# Patient Record
Sex: Male | Born: 1950
Health system: Southern US, Community
[De-identification: ages and names within clinical notes are randomized; demographics above are authoritative.]

## PROBLEM LIST (undated history)

## (undated) DIAGNOSIS — N400 Enlarged prostate without lower urinary tract symptoms: Secondary | ICD-10-CM

## (undated) DIAGNOSIS — T7840XA Allergy, unspecified, initial encounter: Secondary | ICD-10-CM

## (undated) DIAGNOSIS — M1990 Unspecified osteoarthritis, unspecified site: Secondary | ICD-10-CM

## (undated) DIAGNOSIS — E785 Hyperlipidemia, unspecified: Secondary | ICD-10-CM

## (undated) HISTORY — DX: Allergy, unspecified, initial encounter: T78.40XA

## (undated) HISTORY — DX: Benign prostatic hyperplasia without lower urinary tract symptoms: N40.0

## (undated) HISTORY — DX: Hyperlipidemia, unspecified: E78.5

## (undated) HISTORY — PX: COLONOSCOPY: SHX174

## (undated) HISTORY — DX: Unspecified osteoarthritis, unspecified site: M19.90

## (undated) HISTORY — PX: OTHER SURGICAL HISTORY: SHX169

---

## 2003-06-15 LAB — HM COLONOSCOPY: HM Colonoscopy: NORMAL

## 2012-06-14 ENCOUNTER — Encounter: Payer: Self-pay | Admitting: Internal Medicine

## 2012-06-14 ENCOUNTER — Ambulatory Visit (INDEPENDENT_AMBULATORY_CARE_PROVIDER_SITE_OTHER): Payer: BC Managed Care – PPO | Admitting: Internal Medicine

## 2012-06-14 VITALS — BP 102/76 | HR 51 | Temp 98.9°F | Ht 69.0 in | Wt 147.0 lb

## 2012-06-14 DIAGNOSIS — Z1211 Encounter for screening for malignant neoplasm of colon: Secondary | ICD-10-CM

## 2012-06-14 DIAGNOSIS — J329 Chronic sinusitis, unspecified: Secondary | ICD-10-CM

## 2012-06-14 DIAGNOSIS — E785 Hyperlipidemia, unspecified: Secondary | ICD-10-CM

## 2012-06-14 DIAGNOSIS — M1990 Unspecified osteoarthritis, unspecified site: Secondary | ICD-10-CM | POA: Insufficient documentation

## 2012-06-14 DIAGNOSIS — N138 Other obstructive and reflux uropathy: Secondary | ICD-10-CM

## 2012-06-14 DIAGNOSIS — N401 Enlarged prostate with lower urinary tract symptoms: Secondary | ICD-10-CM | POA: Insufficient documentation

## 2012-06-14 DIAGNOSIS — Z79899 Other long term (current) drug therapy: Secondary | ICD-10-CM

## 2012-06-14 LAB — LIPID PANEL
Cholesterol: 212 mg/dL — ABNORMAL HIGH (ref 0–200)
HDL: 78.9 mg/dL (ref >39.00)
Triglycerides: 97 mg/dL (ref 0.0–149.0)

## 2012-06-14 LAB — LDL CHOLESTEROL, DIRECT: Direct LDL: 107.8 mg/dL

## 2012-06-14 NOTE — Assessment & Plan Note (Addendum)
Currently managed with Crestor 5 mg daily goal LDL of 130 or less. Well controlled by current labs. However because of his travel which has him sitting behind the wheel for several hours a day I have recommended that he add baby aspirin to his current regimen.

## 2012-06-14 NOTE — Assessment & Plan Note (Addendum)
Mild, relieved with activity. Occasional Aleve and Tylenol. No signs of cellulitis on exam.

## 2012-06-14 NOTE — Progress Notes (Signed)
Patient ID: Johnny Munoz, male   DOB: 17-Aug-1951, 61 y.o.   MRN: [id number] Patient Active Problem List  Diagnosis  . Osteoarthritis  . Hyperlipidemia LDL goal <130  . BPH with obstruction/lower urinary tract symptoms    Subjective:  CC:   Chief Complaint  Patient presents with  . Establish Care    New Pt to establish     HPI:   Johnny Munoz is a 61 y.o. male who presents as a new patient to establish primary care with the chief complaint of 1)  Hyperlipidemia. managed with crestor,  But was unable to get timely labs order from pervious PCP's office and refills on time .  He denies current myalgias but has a history of myalgias on prior Lipitor therapy and any history of elevated LFTs. He didn't present today a fasting state so he can have his blood work done. He has no history of coronary artery disease or strokes 2) second issue is  chronic sinusitis secondary to deviated septum.  He is trying to avoid surgery and has one or 2 sinus infections per year. Third issue is mild decreased urinary flow secondary to BPH. He is status post transurethral microwave therapy in 2008 for prostate by Dr. Artis Flock, but is now managed by [facility], Dr.  Hillis Range .   his last  PSAs  was elevated at 4.1 ,  and he underwent a biopsy last November which was negative for cancer .  is mildly reduced flow issues are recognized by Dr. Hillis Range but tre is being postponed for a year.  4) occasional arthritis involving the hands and calf cramps occurring at night, which he states have both improved since switching to Crestor .  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 He does engage in a lot of transpyloric travel a daily basis for his job and drives frequently to [place] .    Past Medical History  Diagnosis Date  . Arthritis   . Allergy   . Hyperlipidemia   . BPH (benign prostatic hyperplasia)     Past Surgical History  Procedure Date  . Trans urethral microwave therapy     Family  History  Problem Relation Age of Onset  . Diabetes Father   . Heart disease Father     CABG after 35  . Cancer Neg Hx     History   Social History  . Marital Status: Married    Spouse Name: N/A    Number of Children: N/A  . Years of Education: 16   Occupational History  . Customer Service    Social History Main Topics  . Smoking status: Never Smoker   . Smokeless tobacco: Not on file  . Alcohol Use: 1.5 oz/week    3 drink(s) per week  . Drug Use: No  . Sexually Active: Not on file   Other Topics Concern  . Not on file   Social History Narrative   Regular exercise-yesCaffeine Use-yes   No Known Allergies   Review of Systems:   The remainder of the review of systems was negative except those addressed in the HPI.       Objective:  BP 102/76  Pulse 51  Temp 98.9 F (37.2 C) (Oral)  Ht 5\' 9"  (1.753 m)  Wt 147 lb (66.679 kg)  BMI 21.71 kg/m2  SpO2 97%  General appearance: alert, cooperative and appears stated age Ears: normal TM's and external ear canals both ears Throat: lips, mucosa, and tongue normal; teeth and  gums normal Neck: no adenopathy, no carotid bruit, supple, symmetrical, trachea midline and thyroid not enlarged, symmetric, no tenderness/mass/nodules Back: symmetric, no curvature. ROM normal. No CVA tenderness. Lungs: clear to auscultation bilaterally Heart: regular rate and rhythm, S1, S2 normal, no murmur, click, rub or gallop Abdomen: soft, non-tender; bowel sounds normal; no masses,  no organomegaly Pulses: 2+ and symmetric Skin: Skin color, texture, turgor normal. No rashes or lesions Lymph nodes: Cervical, supraclavicular, and axillary nodes normal.  Assessment and Plan:  Osteoarthritis Mild, relieved with activity. Occasional Aleve and Tylenol. No signs of cellulitis on exam.   Hyperlipidemia LDL goal <130 Currently managed with Crestor 5 mg daily goal LDL of 130 or less. Well controlled by current labs. However because of his  travel which has him sitting behind the wheel for several hours a day I have recommended that he add baby aspirin to his current regimen.  BPH with obstruction/lower urinary tract symptoms Symptoms are mild and Currently under surveillance by [place].  No therapy for decreased flow due to recent elevated  PSA   Updated Medication List Outpatient Encounter Prescriptions as of 06/14/2012  Medication Sig Dispense Refill  . Coenzyme Q10 (COQ10) 100 MG CAPS Take 1 capsule by mouth daily.      . CRESTOR 5 MG tablet Take 5 mg by mouth daily.       Illa Level 80 MCG/ACT AERS 2 puffs into nostril daily         Orders Placed This Encounter  Procedures  . Lipid panel  . COMPLETE METABOLIC PANEL WITH GFR  . LDL cholesterol, direct  . HM COLONOSCOPY    No Follow-up on file.

## 2012-06-14 NOTE — Assessment & Plan Note (Signed)
Symptoms are mild and Currently under surveillance by [facility].  No therapy for decreased flow due to recent elevated  PSA

## 2012-06-14 NOTE — Patient Instructions (Addendum)
Try using Gold bond in socks /shoes to prevent recurrent fungal infections and change socks when damp.  Simply Saline twice daily to flush sinuses, will help prevent recurrent infections.   Add baby aspirin (81 mg) daily to current regimen bc of prolonged sedentary tine in car   We are checking  lipids and liver  Consider a Low Glycemic Index Diet and eating 6 smaller meals daily .  This frequent feeding stimulates your metabolism and the lower glycemic index foods will lower your blood sugars:   This is an example of my daily  "Low GI"  Diet:  All of the foods can be found at grocery stores and in bulk at [employer]  club   7 AM Breakfast:  Low carbohydrate Protein  Shakes (I recommend the EAS AdvantEdge "Carb Control" shakes  Or the low carb shakes by Atkins.   Both are available everywhere:  In  cases at [employer]  Or in 4 packs at grocery stores and pharmacies  2.5 carbs  (Alternative is  a toasted Arnold's Sandwhich Thin w/ peanut butter, a "Bagel Thin" with cream cheese and salmon) or  a scrambled egg burrito made with a low carb tortilla .  Avoid cereal and bananas, oatmeal too unless the old fashioned kind that takes 30-40 minutes to prepare.  the rest is overly processed, has minimal fiber, and loaded with carbohydrates!   10 AM: Protein bar by Atkins (the snack size, under 200 cal.  There are many varieties , available widely again or in bulk in limited varieties at [employer]Js)  Other so called "protein bars" tend to be loaded with carbohydrates.  Remember, in food advertising, the word "energy" is synonymous for " carbohydrate."  Lunch: sandwich of [place], (or any lunchmeat or canned tuna), fresh avocado and cheese on a lower carbohydrate pita bread, flatbread, or tortilla . Ok to use mayonnaise. The bread is the only source or carbohydrate that can be decreased (Joseph's makes a pita bread and a flat bread  Are 50 cal and 4 net carbs ; Toufayan makes a low carb flatbread 100 cal and 9 net carbs  and   Mission makes a low carb whole wheat tortilla  210 cal and 6 net carbs)  3 PM:  Mid day :  Another proteintttt bThe brear,  Or a  cheese stick (100 cal, 0 carbs),  Or 1 ounce of  almonds, walnuts, pistachios, pecans, peanuts,  Macadamia nuts. Or a Dannon light n Fit greek yogurt, 80 cal 8 net carbs . Avoid "granola"; the dried cranberries and raisins are loaded with carbohydrates.    6 PM  Dinner:  "mean and green:"  Meat/chicken/fish or a high protein legume; , with a green salad, and a low GI  Veggie (broccoli, cauliflower, green beans, spinach, brussel sprouts. Lima beans) : Avoid "Low fat dressings, Reyne Dumas and [address]! They are loaded with sugar! Instead use ranch, vinagrette,  Blue cheese, etc  9 PM snack : Breyer's "low carb" fudgsicle or  ice cream bar (Carb Smart line), or  Weight Watcher's ice cream bar , or anouther "no sugar added" ice cream; or another protein shake or a serving of fresh fruit with whipped cream (Avoid bananas, pineapple, grapes  and watermelon on a regular basis because they are high in sugar)   Remember that snack Substitutions should be less than 15 to 20 carbs  Per serving. Remember to subtract fiber grams to get the "net carbs."

## 2012-06-15 LAB — COMPLETE METABOLIC PANEL WITH GFR
Alkaline Phosphatase: 55 U/L (ref 39–117)
BUN: 14 mg/dL (ref 6–23)
CO2: 26 mEq/L (ref 19–32)
Creat: 0.91 mg/dL (ref 0.50–1.35)
GFR, Est African American: >89 mL/min
GFR, Est Non African American: >89 mL/min
Glucose, Bld: 89 mg/dL (ref 70–99)
Sodium: 140 mEq/L (ref 135–145)
Total Bilirubin: 0.6 mg/dL (ref 0.3–1.2)

## 2012-08-04 ENCOUNTER — Other Ambulatory Visit: Payer: Self-pay | Admitting: Internal Medicine

## 2012-08-04 DIAGNOSIS — E785 Hyperlipidemia, unspecified: Secondary | ICD-10-CM

## 2012-08-04 MED ORDER — ROSUVASTATIN CALCIUM 5 MG PO TABS: 5.0000 mg | ORAL_TABLET | Freq: Every day | ORAL | Status: DC

## 2012-08-04 NOTE — Telephone Encounter (Signed)
Patient is out of his Crestor 5 mg will need a prescription or samples.

## 2013-05-30 ENCOUNTER — Other Ambulatory Visit: Payer: Self-pay | Admitting: Internal Medicine

## 2013-05-30 NOTE — Telephone Encounter (Signed)
Has appt scheduled 06/28/13.

## 2013-06-28 ENCOUNTER — Encounter: Payer: Self-pay | Admitting: Emergency Medicine

## 2013-06-28 ENCOUNTER — Ambulatory Visit (INDEPENDENT_AMBULATORY_CARE_PROVIDER_SITE_OTHER): Payer: 59 | Admitting: Internal Medicine

## 2013-06-28 ENCOUNTER — Encounter: Payer: Self-pay | Admitting: Internal Medicine

## 2013-06-28 ENCOUNTER — Encounter: Payer: Self-pay | Admitting: *Deleted

## 2013-06-28 VITALS — BP 116/78 | HR 63 | Temp 98.7°F | Resp 16 | Ht 67.0 in | Wt 146.5 lb

## 2013-06-28 DIAGNOSIS — N138 Other obstructive and reflux uropathy: Secondary | ICD-10-CM

## 2013-06-28 DIAGNOSIS — N401 Enlarged prostate with lower urinary tract symptoms: Secondary | ICD-10-CM

## 2013-06-28 DIAGNOSIS — Z0000 Encounter for general adult medical examination without abnormal findings: Secondary | ICD-10-CM

## 2013-06-28 DIAGNOSIS — Z79899 Other long term (current) drug therapy: Secondary | ICD-10-CM

## 2013-06-28 DIAGNOSIS — Z1211 Encounter for screening for malignant neoplasm of colon: Secondary | ICD-10-CM

## 2013-06-28 DIAGNOSIS — Z125 Encounter for screening for malignant neoplasm of prostate: Secondary | ICD-10-CM

## 2013-06-28 DIAGNOSIS — E785 Hyperlipidemia, unspecified: Secondary | ICD-10-CM

## 2013-06-28 LAB — PSA: PSA: 2.11 ng/mL (ref 0.10–4.00)

## 2013-06-28 LAB — LIPID PANEL
Cholesterol: 201 mg/dL — ABNORMAL HIGH (ref 0–200)
HDL: 72.9 mg/dL (ref >39.00)
Triglycerides: 76 mg/dL (ref 0.0–149.0)
VLDL: 15.2 mg/dL (ref 0.0–40.0)

## 2013-06-28 LAB — COMPREHENSIVE METABOLIC PANEL
Alkaline Phosphatase: 57 U/L (ref 39–117)
BUN: 16 mg/dL (ref 6–23)
CO2: 28 mEq/L (ref 19–32)
Creatinine, Ser: 0.9 mg/dL (ref 0.4–1.5)
GFR: 89.8 mL/min (ref >60.00)
Glucose, Bld: 97 mg/dL (ref 70–99)
Sodium: 139 mEq/L (ref 135–145)
Total Bilirubin: 0.6 mg/dL (ref 0.3–1.2)

## 2013-06-28 MED ORDER — LEVOFLOXACIN 500 MG PO TABS: 500.0000 mg | ORAL_TABLET | Freq: Every day | ORAL | Status: DC

## 2013-06-28 NOTE — Assessment & Plan Note (Signed)
Well controlled on current statin therapy.   Liver enzymes are normal , no changes today.  

## 2013-06-28 NOTE — Patient Instructions (Addendum)
Take the levaquin with you to [place].,  Start for urinary symptoms,  Sinus or respiratory symtoms but not unless you have symptoms   Start taking  Full strength aspirin a few days prior to trip  Get up every 2 hours and walk /stretch legs  Compression knee highs may help prevent fluid retention during long flights.

## 2013-06-28 NOTE — Assessment & Plan Note (Signed)
Annual male exam was done excluding testicular and prostate exam. PSA is normal .  Colon ca screening was reviewed and options given.

## 2013-06-28 NOTE — Assessment & Plan Note (Signed)
He is s/p prostate biopsy in the past for elevated PSA.  Current PSA is normal at 2.11

## 2013-06-28 NOTE — Progress Notes (Signed)
Patient ID: Johnny Munoz, male   DOB: 09-01-1951, 62 y.o.   MRN: [id number]  .The patient is here for his annual physical examination and management of other chronic and acute problems.   The risk factors are reflected in the social history.  The roster of all physicians providing medical care to patient - is listed in the Snapshot section of the chart.  Activities of daily living:  The patient is 100% independent in all ADLs: dressing, toileting, feeding as well as independent mobility  Home safety : The patient has smoke detectors in the home. He wears seatbelts.  There are no firearms at home. There is no violence in the home.   There is no risks for hepatitis, STDs or HIV. There is no   history of blood transfusion. There is no travel history to infectious disease endemic areas of the world.  The patient has seen their dentist in the last six month and  their eye doctor in the last year.  They do not  have excessive sun exposure. They have seen a dermatoloigist in the last year. Discussed the need for sun protection: hats, long sleeves and use of sunscreen if there is significant sun exposure.   Diet: the importance of a healthy diet is discussed. They do have a healthy diet.  The benefits of regular aerobic exercise were discussed. He exercises a minimum of 30 minutes  5 days per week. Depression screen: there are no signs or vegative symptoms of depression- irritability, change in appetite, anhedonia, sadness/tearfullness.  The following portions of the patient's history were reviewed and updated as appropriate: allergies, current medications, past family history, past medical history,  past surgical history, past social history  and problem list.  Visual acuity was not assessed per patient preference since he has regular follow up with his ophthalmologist. Hearing and body mass index were assessed and reviewed.   During the course of the visit the patient was educated and  counseled about appropriate screening and preventive services including :  nutrition counseling, colorectal cancer screening, and recommended immunizations.    Objective:  BP 116/78  Pulse 63  Temp(Src) 98.7 F (37.1 C) (Oral)  Resp 16  Ht 5\' 7"  (1.702 m)  Wt 146 lb 8 oz (66.452 kg)  BMI 22.94 kg/m2  SpO2 98%  BP 116/78  Pulse 63  Temp(Src) 98.7 F (37.1 C) (Oral)  Resp 16  Ht 5\' 7"  (1.702 m)  Wt 146 lb 8 oz (66.452 kg)  BMI 22.94 kg/m2  SpO2 98%  General Appearance:    Alert, cooperative, no distress, appears stated age  Head:    Normocephalic, without obvious abnormality, atraumatic  Eyes:    PERRL, conjunctiva/corneas clear, EOM's intact, fundi    benign, both eyes       Ears:    Normal TM's and external ear canals, both ears  Nose:   Nares normal, septum midline, mucosa normal, no drainage   or sinus tenderness  Throat:   Lips, mucosa, and tongue normal; teeth and gums normal  Neck:   Supple, symmetrical, trachea midline, no adenopathy;       thyroid:  No enlargement/tenderness/nodules; no carotid   bruit or JVD  Back:     Symmetric, no curvature, ROM normal, no CVA tenderness  Lungs:     Clear to auscultation bilaterally, respirations unlabored  Chest wall:    No tenderness or deformity  Heart:    Regular rate and rhythm, S1 and S2 normal, no murmur, rub  or gallop  Abdomen:     Soft, non-tender, bowel sounds active all four quadrants,    no masses, no organomegaly  Extremities:   Extremities normal, atraumatic, no cyanosis or edema  Pulses:   2+ and symmetric all extremities  Skin:   Skin color, texture, turgor normal, no rashes or lesions  Lymph nodes:   Cervical, supraclavicular, and axillary nodes normal  Neurologic:   CNII-XII intact. Normal strength, sensation and reflexes      throughout   Assessment and Plan:  Hyperlipidemia LDL goal <130 Well controlled on current statin therapy.   Liver enzymes are normal , no changes today.  BPH with  obstruction/lower urinary tract symptoms He is s/p prostate biopsy in the past for elevated PSA.  Current PSA is normal at 2.11  Routine general medical examination at a health care facility Annual male exam was done excluding testicular and prostate exam. PSA is normal .  Colon ca screening was reviewed and options given.     Updated Medication List Outpatient Encounter Prescriptions as of 06/28/2013  Medication Sig Dispense Refill  . CRESTOR 5 MG tablet take 1 tablet by mouth once daily  30 tablet  0  . finasteride (PROSCAR) 5 MG tablet Take 5 mg by mouth daily.      . QNASL 80 MCG/ACT AERS 2 puffs into nostril daily      . Coenzyme Q10 (COQ10) 100 MG CAPS Take 1 capsule by mouth daily.      Marland Kitchen levofloxacin (LEVAQUIN) 500 MG tablet Take 1 tablet (500 mg total) by mouth daily.  7 tablet  0   No facility-administered encounter medications on file as of 06/28/2013.

## 2013-06-29 ENCOUNTER — Encounter: Payer: Self-pay | Admitting: Internal Medicine

## 2013-07-11 ENCOUNTER — Other Ambulatory Visit: Payer: Self-pay | Admitting: Internal Medicine

## 2013-07-21 ENCOUNTER — Ambulatory Visit: Payer: Self-pay | Admitting: General Surgery

## 2013-08-16 ENCOUNTER — Encounter: Payer: Self-pay | Admitting: General Surgery

## 2013-08-16 ENCOUNTER — Ambulatory Visit (INDEPENDENT_AMBULATORY_CARE_PROVIDER_SITE_OTHER): Payer: 59 | Admitting: General Surgery

## 2013-08-16 VITALS — BP 126/70 | HR 68 | Resp 12 | Ht 69.0 in | Wt 148.0 lb

## 2013-08-16 DIAGNOSIS — Z1211 Encounter for screening for malignant neoplasm of colon: Secondary | ICD-10-CM

## 2013-08-16 MED ORDER — POLYETHYLENE GLYCOL 3350 17 GM/SCOOP PO POWD: ORAL | Status: DC

## 2013-08-16 NOTE — Progress Notes (Signed)
Patient ID: Johnny Munoz, male   DOB: 03/13/1951, 62 y.o.   MRN: [id number]  Chief Complaint  Patient presents with  . Other    colonoscopy    HPI Johnny Munoz is a 62 y.o. male here today for an evaluation of an screening colonoscopy.Patient last colonoscopy was in 2003 no polyps found at that time.No GI problems. HPI  Past Medical History  Diagnosis Date  . Arthritis   . Allergy   . Hyperlipidemia   . BPH (benign prostatic hyperplasia)     Past Surgical History  Procedure Laterality Date  . Trans urethral microwave therapy    . Colonoscopy  2003    Dr. Maryruth Bun    Family History  Problem Relation Age of Onset  . Diabetes Father   . Heart disease Father     CABG after 67  . Cancer Neg Hx     Social History History  Substance Use Topics  . Smoking status: Never Smoker   . Smokeless tobacco: Never Used  . Alcohol Use: 1.5 oz/week    3 drink(s) per week    No Known Allergies  Current Outpatient Prescriptions  Medication Sig Dispense Refill  . CRESTOR 5 MG tablet take 1 tablet by mouth once daily  30 tablet  5  . finasteride (PROSCAR) 5 MG tablet Take 5 mg by mouth daily.      . QNASL 80 MCG/ACT AERS 2 puffs into nostril daily      . polyethylene glycol powder (GLYCOLAX/MIRALAX) powder 255 grams one bottle for colonoscopy prep  255 g  0   No current facility-administered medications for this visit.    Review of Systems Review of Systems  Constitutional: Negative.   Respiratory: Negative.   Cardiovascular: Negative.   Gastrointestinal: Negative.     Blood pressure 126/70, pulse 68, resp. rate 12, height 5\' 9"  (1.753 m), weight 148 lb (67.132 kg).  Physical Exam Physical Exam  Constitutional: He is oriented to person, place, and time. He appears well-developed and well-nourished.  Cardiovascular: Normal rate, regular rhythm and normal heart sounds.   Pulmonary/Chest: Breath sounds normal.  Neurological: He is alert and oriented to person,  place, and time.  Skin: Skin is warm and dry.    Data Reviewed None.  Assessment    Candidate for screening colonoscopy.     Plan    The procedure was reviewed. Risks associated with procedure were discussed including bleeding and perforation.     Patient has been scheduled for a colonoscopy on 10-12-13 at [facility].   [place], [place] 08/18/2013, 5:45 AM

## 2013-08-16 NOTE — Patient Instructions (Addendum)
Colonoscopy A colonoscopy is an exam to evaluate your entire colon. In this exam, your colon is cleansed. A long fiberoptic tube is inserted through your rectum and into your colon. The fiberoptic scope (endoscope) is a long bundle of enclosed and very flexible fibers. These fibers transmit light to the area examined and send images from that area to your caregiver. Discomfort is usually minimal. You may be given a drug to help you sleep (sedative) during or prior to the procedure. This exam helps to detect lumps (tumors), polyps, inflammation, and areas of bleeding. Your caregiver may also take a small piece of tissue (biopsy) that will be examined under a microscope. LET YOUR CAREGIVER KNOW ABOUT:   Allergies to food or medicine.  Medicines taken, including vitamins, herbs, eyedrops, over-the-counter medicines, and creams.  Use of steroids (by mouth or creams).  Previous problems with anesthetics or numbing medicines.  History of bleeding problems or blood clots.  Previous surgery.  Other health problems, including diabetes and kidney problems.  Possibility of pregnancy, if this applies. BEFORE THE PROCEDURE   A clear liquid diet may be required for 2 days before the exam.  Ask your caregiver about changing or stopping your regular medications.  Liquid injections (enemas) or laxatives may be required.  A large amount of electrolyte solution may be given to you to drink over a short period of time. This solution is used to clean out your colon.  You should be present 60 minutes prior to your procedure or as directed by your caregiver. AFTER THE PROCEDURE   If you received a sedative or pain relieving medication, you will need to arrange for someone to drive you home.  Occasionally, there is a little blood passed with the first bowel movement. Do not be concerned. FINDING OUT THE RESULTS OF YOUR TEST Not all test results are available during your visit. If your test results are  not back during the visit, make an appointment with your caregiver to find out the results. Do not assume everything is normal if you have not heard from your caregiver or the medical facility. It is important for you to follow up on all of your test results. HOME CARE INSTRUCTIONS   It is not unusual to pass moderate amounts of gas and experience mild abdominal cramping following the procedure. This is due to air being used to inflate your colon during the exam. Walking or a warm pack on your belly (abdomen) may help.  You may resume all normal meals and activities after sedatives and medicines have worn off.  Only take over-the-counter or prescription medicines for pain, discomfort, or fever as directed by your caregiver. Do not use aspirin or blood thinners if a biopsy was taken. Consult your caregiver for medicine usage if biopsies were taken. SEEK IMMEDIATE MEDICAL CARE IF:   You have a fever.  You pass large blood clots or fill a toilet with blood following the procedure. This may also occur 10 to 14 days following the procedure. This is more likely if a biopsy was taken.  You develop abdominal pain that keeps getting worse and cannot be relieved with medicine. Document Released: 11/14/2000 Document Revised: 02/09/2012 Document Reviewed: 06/29/2008 ExitCare Patient Information 2014 [employer].  Patient has been scheduled for a colonoscopy on 10-12-13 at [facility]. 

## 2013-08-18 ENCOUNTER — Encounter: Payer: Self-pay | Admitting: General Surgery

## 2013-10-06 ENCOUNTER — Other Ambulatory Visit: Payer: Self-pay

## 2013-10-06 ENCOUNTER — Telehealth: Payer: Self-pay | Admitting: *Deleted

## 2013-10-06 NOTE — Telephone Encounter (Signed)
Patient reports no change in medications since last office visit. This patient was instructed to pre-register by this Friday since he has not done so already. Patient does report he has his Miralax prescription. We will proceed with colonoscopy that is scheduled at [facility] for 10-12-13. He was instructed to call the office if he has further questions.

## 2013-10-08 ENCOUNTER — Other Ambulatory Visit: Payer: Self-pay | Admitting: General Surgery

## 2013-10-08 DIAGNOSIS — Z1211 Encounter for screening for malignant neoplasm of colon: Secondary | ICD-10-CM

## 2013-10-11 LAB — HM COLONOSCOPY: HM Colonoscopy: NORMAL

## 2013-10-12 ENCOUNTER — Ambulatory Visit: Payer: Self-pay | Admitting: General Surgery

## 2013-10-12 DIAGNOSIS — Z1211 Encounter for screening for malignant neoplasm of colon: Secondary | ICD-10-CM

## 2013-10-12 DIAGNOSIS — D129 Benign neoplasm of anus and anal canal: Secondary | ICD-10-CM

## 2013-10-12 DIAGNOSIS — D128 Benign neoplasm of rectum: Secondary | ICD-10-CM

## 2013-10-13 ENCOUNTER — Encounter: Payer: Self-pay | Admitting: General Surgery

## 2013-10-13 LAB — PATHOLOGY REPORT

## 2013-10-14 ENCOUNTER — Telehealth: Payer: Self-pay

## 2013-10-14 NOTE — Telephone Encounter (Signed)
Notified patient of results as instructed, patient pleased. Discussed follow-up colonoscopy in 10 years, patient agrees. Placed in recalls for 10 year follow up colonoscopy.

## 2013-10-14 NOTE — Telephone Encounter (Signed)
Message copied by Sinda Du on Fri Oct 14, 2013  8:45 AM ------      Message from: [place], [place] W      Created: Thu Oct 13, 2013  8:32 PM       Please notify the patient the polyp removed at the time of his recent colonoscopy was entirely benign. A follow up exam in 10 years would be appropriate, earlier if he develops any symptoms. Thank you      ----- Message -----         From: Jena Gauss, CMA         Sent: 10/13/2013   2:08 PM           To: Earline Mayotte, MD                   ------

## 2013-10-17 ENCOUNTER — Telehealth: Payer: Self-pay | Admitting: General Surgery

## 2013-10-17 NOTE — Telephone Encounter (Signed)
The biopsy from the ileocecal valve completed at the time of his 10/12/2013 showed polypoid colonic mucosa. No pathologic changes. No evidence of dysplasia or malignancy.  The patient was notified that he should plan on a followup screening exam in 10 years, earlier if problems arise.

## 2013-12-29 ENCOUNTER — Other Ambulatory Visit: Payer: Self-pay | Admitting: Internal Medicine

## 2013-12-29 ENCOUNTER — Other Ambulatory Visit (INDEPENDENT_AMBULATORY_CARE_PROVIDER_SITE_OTHER): Payer: No Typology Code available for payment source

## 2013-12-29 ENCOUNTER — Telehealth: Payer: Self-pay | Admitting: *Deleted

## 2013-12-29 DIAGNOSIS — E785 Hyperlipidemia, unspecified: Secondary | ICD-10-CM

## 2013-12-29 DIAGNOSIS — Z79899 Other long term (current) drug therapy: Secondary | ICD-10-CM

## 2013-12-29 LAB — COMPREHENSIVE METABOLIC PANEL
ALT: 33 U/L (ref 0–53)
AST: 21 U/L (ref 0–37)
Albumin: 4.4 g/dL (ref 3.5–5.2)
Alkaline Phosphatase: 64 U/L (ref 39–117)
BILIRUBIN TOTAL: 0.9 mg/dL (ref 0.3–1.2)
BUN: 17 mg/dL (ref 6–23)
CO2: 29 mEq/L (ref 19–32)
CREATININE: 0.9 mg/dL (ref 0.4–1.5)
Calcium: 9.2 mg/dL (ref 8.4–10.5)
Chloride: 102 mEq/L (ref 96–112)
GFR: 91.98 mL/min (ref >60.00)
Glucose, Bld: 93 mg/dL (ref 70–99)
Potassium: 4.4 mEq/L (ref 3.5–5.1)
Sodium: 138 mEq/L (ref 135–145)
Total Protein: 6.9 g/dL (ref 6.0–8.3)

## 2013-12-29 LAB — LIPID PANEL
CHOLESTEROL: 206 mg/dL — AB (ref 0–200)
HDL: 64.2 mg/dL (ref >39.00)
TRIGLYCERIDES: 111 mg/dL (ref 0.0–149.0)
Total CHOL/HDL Ratio: 3
VLDL: 22.2 mg/dL (ref 0.0–40.0)

## 2013-12-29 LAB — LDL CHOLESTEROL, DIRECT: Direct LDL: 120.8 mg/dL

## 2013-12-29 LAB — TSH: TSH: 1.8 u[IU]/mL (ref 0.35–5.50)

## 2013-12-29 NOTE — Telephone Encounter (Signed)
Patient would like to know if he needs a Tetanus, it is showing overdue on his Mychart. Also Zoster is showing overdue as well however the patient has never had the chicken pox or any exposure, does he really need to get the Zoster?

## 2013-12-29 NOTE — Telephone Encounter (Signed)
Patient informed and verbalized understanding

## 2013-12-29 NOTE — Telephone Encounter (Signed)
His office notes from dr Heber Corona indicate that he had a tetanus booster in 2009  So he is not due til 2019. I have updated his chart   Dr. Loni Muse  gave him an rx for shingles vaccine in 2013 at his [facility]. If he did not get the shingles vaccine then,  He would still be advised to get it.  If he does not recall whether he had chicken pox as a child he can have a titer checked for it next time he gets labs to confirm, BEFORE he gets the vaccine.

## 2014-01-01 ENCOUNTER — Encounter: Payer: Self-pay | Admitting: Internal Medicine

## 2014-06-02 ENCOUNTER — Other Ambulatory Visit: Payer: Self-pay | Admitting: Internal Medicine

## 2014-06-28 ENCOUNTER — Encounter: Payer: 59 | Admitting: Internal Medicine

## 2014-07-10 ENCOUNTER — Encounter: Payer: No Typology Code available for payment source | Admitting: Internal Medicine

## 2014-08-11 ENCOUNTER — Encounter: Payer: Self-pay | Admitting: Internal Medicine

## 2014-08-11 ENCOUNTER — Ambulatory Visit (INDEPENDENT_AMBULATORY_CARE_PROVIDER_SITE_OTHER): Payer: No Typology Code available for payment source | Admitting: Internal Medicine

## 2014-08-11 VITALS — BP 122/68 | HR 54 | Temp 98.7°F | Resp 14 | Ht 69.0 in | Wt 145.5 lb

## 2014-08-11 DIAGNOSIS — R5381 Other malaise: Secondary | ICD-10-CM

## 2014-08-11 DIAGNOSIS — Z79899 Other long term (current) drug therapy: Secondary | ICD-10-CM

## 2014-08-11 DIAGNOSIS — E785 Hyperlipidemia, unspecified: Secondary | ICD-10-CM

## 2014-08-11 DIAGNOSIS — R5383 Other fatigue: Secondary | ICD-10-CM

## 2014-08-11 DIAGNOSIS — Z0000 Encounter for general adult medical examination without abnormal findings: Secondary | ICD-10-CM

## 2014-08-11 DIAGNOSIS — N401 Enlarged prostate with lower urinary tract symptoms: Secondary | ICD-10-CM

## 2014-08-11 DIAGNOSIS — Z23 Encounter for immunization: Secondary | ICD-10-CM

## 2014-08-11 DIAGNOSIS — N138 Other obstructive and reflux uropathy: Secondary | ICD-10-CM

## 2014-08-11 LAB — COMPREHENSIVE METABOLIC PANEL
ALBUMIN: 4.2 g/dL (ref 3.5–5.2)
ALK PHOS: 57 U/L (ref 39–117)
ALT: 43 U/L (ref 0–53)
AST: 29 U/L (ref 0–37)
BUN: 22 mg/dL (ref 6–23)
CO2: 30 mEq/L (ref 19–32)
Calcium: 9.1 mg/dL (ref 8.4–10.5)
Chloride: 102 mEq/L (ref 96–112)
Creatinine, Ser: 0.9 mg/dL (ref 0.4–1.5)
GFR: 94.24 mL/min (ref >60.00)
Glucose, Bld: 97 mg/dL (ref 70–99)
POTASSIUM: 4.8 meq/L (ref 3.5–5.1)
Sodium: 138 mEq/L (ref 135–145)
Total Bilirubin: 0.6 mg/dL (ref 0.2–1.2)
Total Protein: 7.1 g/dL (ref 6.0–8.3)

## 2014-08-11 LAB — LIPID PANEL
CHOL/HDL RATIO: 3
Cholesterol: 175 mg/dL (ref 0–200)
HDL: 65.2 mg/dL (ref >39.00)
LDL Cholesterol: 89 mg/dL (ref 0–99)
NonHDL: 109.8
Triglycerides: 105 mg/dL (ref 0.0–149.0)
VLDL: 21 mg/dL (ref 0.0–40.0)

## 2014-08-11 NOTE — Assessment & Plan Note (Signed)
Annual exam was done excluding testicular and prostate exam. PSA is done annually by Urology .  Colon ca screening was updated with results of November colonoscopy by [facility]  immunizations reviewed and options given.  handout given

## 2014-08-11 NOTE — Progress Notes (Signed)
Pre visit review using our clinic review tool, if applicable. No additional management support is needed unless otherwise documented below in the visit note. 

## 2014-08-11 NOTE — Patient Instructions (Addendum)
You had your annual  wellness exam today   I recommend taking turmeric and fish oil for anti inflammatory properties   You received your annual influenza vaccine today  I recommend you return next month for th TDaP vaccine  (tetanus diphtheria and pertussis/whooping cough)   Potassium Content of Foods Potassium is a mineral found in many foods and drinks. It helps keep fluids and minerals balanced in your body and affects how steadily your heart beats. Potassium also helps control your blood pressure and keep your muscles and nervous system healthy. Certain health conditions and medicines may change the balance of potassium in your body. When this happens, you can help balance your level of potassium through the foods that you do or do not eat. Your health care provider or dietitian may recommend an amount of potassium that you should have each day. The following lists of foods provide the amount of potassium (in parentheses) per serving in each item. HIGH IN POTASSIUM  The following foods and beverages have 200 mg or more of potassium per serving:  Apricots, 2 raw or 5 dry (200 mg).  Artichoke, 1 medium (345 mg).  Avocado, raw,  each (245 mg).  Banana, 1 medium (425 mg).  Beans, lima, or baked beans, canned,  cup (280 mg).  Beans, white, canned,  cup (595 mg).  Beef roast, 3 oz (320 mg).  Beef, ground, 3 oz (270 mg).  Beets, raw or cooked,  cup (260 mg).  Bran muffin, 2 oz (300 mg).  Broccoli,  cup (230 mg).  Brussels sprouts,  cup (250 mg).  Cantaloupe,  cup (215 mg).  Cereal, 100% bran,  cup (200-400 mg).  Cheeseburger, single, fast food, 1 each (225-400 mg).  Chicken, 3 oz (220 mg).  Clams, canned, 3 oz (535 mg).  Crab, 3 oz (225 mg).  Dates, 5 each (270 mg).  Dried beans and peas,  cup (300-475 mg).  Figs, dried, 2 each (260 mg).  Fish: halibut, tuna, cod, snapper, 3 oz (480 mg).  Fish: salmon, haddock, swordfish, perch, 3 oz (300 mg).  Fish,  tuna, canned 3 oz (200 mg).  [place] fries, fast food, 3 oz (470 mg).  Granola with fruit and nuts,  cup (200 mg).  Grapefruit juice,  cup (200 mg).  Greens, beet,  cup (655 mg).  Honeydew melon,  cup (200 mg).  Kale, raw, 1 cup (300 mg).  Kiwi, 1 medium (240 mg).  Kohlrabi, rutabaga, parsnips,  cup (280 mg).  Lentils,  cup (365 mg).  Mango, 1 each (325 mg).  Milk, chocolate, 1 cup (420 mg).  Milk: nonfat, low-fat, whole, buttermilk, 1 cup (350-380 mg).  Molasses, 1 Tbsp (295 mg).  Mushrooms,  cup (280) mg.  Nectarine, 1 each (275 mg).  Nuts: almonds, peanuts, hazelnuts, [place], cashew, mixed, 1 oz (200 mg).  Nuts, pistachios, 1 oz (295 mg).  Orange, 1 each (240 mg).  Orange juice,  cup (235 mg).  Papaya, medium,  fruit (390 mg).  Peanut butter, chunky, 2 Tbsp (240 mg).  Peanut butter, smooth, 2 Tbsp (210 mg).  Pear, 1 medium (200 mg).  Pomegranate, 1 whole (400 mg).  Pomegranate juice,  cup (215 mg).  Pork, 3 oz (350 mg).  Potato chips, salted, 1 oz (465 mg).  Potato, baked with skin, 1 medium (925 mg).  Potatoes, boiled,  cup (255 mg).  Potatoes, mashed,  cup (330 mg).  Prune juice,  cup (370 mg).  Prunes, 5 each (305 mg).  Pudding,  chocolate,  cup (230 mg).  Pumpkin, canned,  cup (250 mg).  Raisins, seedless,  cup (270 mg).  Seeds, sunflower or pumpkin, 1 oz (240 mg).  Soy milk, 1 cup (300 mg).  Spinach,  cup (420 mg).  Spinach, canned,  cup (370 mg).  Sweet potato, baked with skin, 1 medium (450 mg).  Swiss chard,  cup (480 mg).  Tomato or vegetable juice,  cup (275 mg).  Tomato sauce or puree,  cup (400-550 mg).  Tomato, raw, 1 medium (290 mg).  Tomatoes, canned,  cup (200-300 mg).  [place], 3 oz (250 mg).  Wheat germ, 1 oz (250 mg).  Winter squash,  cup (250 mg).  Yogurt, plain or fruited, 6 oz (260-435 mg).  Zucchini,  cup (220 mg). MODERATE IN POTASSIUM The following foods and beverages  have 50-200 mg of potassium per serving:  Apple, 1 each (150 mg).  Apple juice,  cup (150 mg).  Applesauce,  cup (90 mg).  Apricot nectar,  cup (140 mg).  Asparagus, small spears,  cup or 6 spears (155 mg).  Bagel, cinnamon raisin, 1 each (130 mg).  Bagel, egg or plain, 4 in., 1 each (70 mg).  Beans, green,  cup (90 mg).  Beans, yellow,  cup (190 mg).  Beer, regular, 12 oz (100 mg).  Beets, canned,  cup (125 mg).  Blackberries,  cup (115 mg).  Blueberries,  cup (60 mg).  Bread, whole wheat, 1 slice (70 mg).  Broccoli, raw,  cup (145 mg).  Cabbage,  cup (150 mg).  Carrots, cooked or raw,  cup (180 mg).  Cauliflower, raw,  cup (150 mg).  Celery, raw,  cup (155 mg).  Cereal, bran flakes, cup (120-150 mg).  Cheese, cottage,  cup (110 mg).  Cherries, 10 each (150 mg).  Chocolate, 1 oz bar (165 mg).  Coffee, brewed 6 oz (90 mg).  Corn,  cup or 1 ear (195 mg).  Cucumbers,  cup (80 mg).  Egg, large, 1 each (60 mg).  Eggplant,  cup (60 mg).  Endive, raw, cup (80 mg).  English muffin, 1 each (65 mg).  Fish, orange roughy, 3 oz (150 mg).  Frankfurter, beef or pork, 1 each (75 mg).  Fruit cocktail,  cup (115 mg).  Grape juice,  cup (170 mg).  Grapefruit,  fruit (175 mg).  Grapes,  cup (155 mg).  Greens: kale, turnip, collard,  cup (110-150 mg).  Ice cream or frozen yogurt, chocolate,  cup (175 mg).  Ice cream or frozen yogurt, vanilla,  cup (120-150 mg).  Lemons, limes, 1 each (80 mg).  Lettuce, all types, 1 cup (100 mg).  Mixed vegetables,  cup (150 mg).  Mushrooms, raw,  cup (110 mg).  Nuts: walnuts, pecans, or macadamia, 1 oz (125 mg).  Oatmeal,  cup (80 mg).  Okra,  cup (110 mg).  Onions, raw,  cup (120 mg).  Peach, 1 each (185 mg).  Peaches, canned,  cup (120 mg).  Pears, canned,  cup (120 mg).  Peas, green, frozen,  cup (90 mg).  Peppers, green,  cup (130 mg).  Peppers, red,  cup  (160 mg).  Pineapple juice,  cup (165 mg).  Pineapple, fresh or canned,  cup (100 mg).  Plums, 1 each (105 mg).  Pudding, vanilla,  cup (150 mg).  Raspberries,  cup (90 mg).  Rhubarb,  cup (115 mg).  Rice, wild,  cup (80 mg).  Shrimp, 3 oz (155 mg).  Spinach, raw, 1 cup (170 mg).  Strawberries,  cup (125 mg).  Summer squash  cup (175-200 mg).  Swiss chard, raw, 1 cup (135 mg).  Tangerines, 1 each (140 mg).  Tea, brewed, 6 oz (65 mg).  Turnips,  cup (140 mg).  Watermelon,  cup (85 mg).  Wine, red, table, 5 oz (180 mg).  Wine, white, table, 5 oz (100 mg). LOW IN POTASSIUM The following foods and beverages have less than 50 mg of potassium per serving.  Bread, white, 1 slice (30 mg).  Carbonated beverages, 12 oz (less than 5 mg).  Cheese, 1 oz (20-30 mg).  Cranberries,  cup (45 mg).  Cranberry juice cocktail,  cup (20 mg).  Fats and oils, 1 Tbsp (less than 5 mg).  Hummus, 1 Tbsp (32 mg).  Nectar: papaya, mango, or pear,  cup (35 mg).  Rice, white or brown,  cup (50 mg).  Spaghetti or macaroni,  cup cooked (30 mg).  Tortilla, flour or corn, 1 each (50 mg).  Waffle, 4 in., 1 each (50 mg).  Water chestnuts,  cup (40 mg). Document Released: 07/01/2005 Document Revised: 11/22/2013 Document Reviewed: 10/14/2013 [facility] Patient Information 2015 [place], [place]. This information is not intended to replace advice given to you by your health care provider. Make sure you discuss any questions you have with your health care provider.   Health Maintenance A healthy lifestyle and preventative care can promote health and wellness.  Maintain regular health, dental, and eye exams.  Eat a healthy diet. Foods like vegetables, fruits, whole grains, low-fat dairy products, and lean protein foods contain the nutrients you need and are low in calories. Decrease your intake of foods high in solid fats, added sugars, and salt. Get information about a  proper diet from your health care provider, if necessary.  Regular physical exercise is one of the most important things you can do for your health. Most adults should get at least 150 minutes of moderate-intensity exercise (any activity that increases your heart rate and causes you to sweat) each week. In addition, most adults need muscle-strengthening exercises on 2 or more days a week.   Maintain a healthy weight. The body mass index (BMI) is a screening tool to identify possible weight problems. It provides an estimate of body fat based on height and weight. Your health care provider can find your BMI and can help you achieve or maintain a healthy weight. For males 20 years and older:  A BMI below 18.5 is considered underweight.  A BMI of 18.5 to 24.9 is normal.  A BMI of 25 to 29.9 is considered overweight.  A BMI of 30 and above is considered obese.  Maintain normal blood lipids and cholesterol by exercising and minimizing your intake of saturated fat. Eat a balanced diet with plenty of fruits and vegetables. Blood tests for lipids and cholesterol should begin at age 62 and be repeated every 5 years. If your lipid or cholesterol levels are high, you are over age 94, or you are at high risk for heart disease, you may need your cholesterol levels checked more frequently.Ongoing high lipid and cholesterol levels should be treated with medicines if diet and exercise are not working.  If you smoke, find out from your health care provider how to quit. If you do not use tobacco, do not start.  Lung cancer screening is recommended for adults aged 57-80 years who are at high risk for developing lung cancer because of a history of smoking. A yearly low-dose CT scan of the  lungs is recommended for people who have at least a 30-pack-year history of smoking and are current smokers or have quit within the past 15 years. A pack year of smoking is smoking an average of 1 pack of cigarettes a day for 1 year  (for example, a 30-pack-year history of smoking could mean smoking 1 pack a day for 30 years or 2 packs a day for 15 years). Yearly screening should continue until the smoker has stopped smoking for at least 15 years. Yearly screening should be stopped for people who develop a health problem that would prevent them from having lung cancer treatment.  If you choose to drink alcohol, do not have more than 2 drinks per day. One drink is considered to be 12 oz (360 mL) of beer, 5 oz (150 mL) of wine, or 1.5 oz (45 mL) of liquor.  Avoid the use of street drugs. Do not share needles with anyone. Ask for help if you need support or instructions about stopping the use of drugs.  High blood pressure causes heart disease and increases the risk of stroke. Blood pressure should be checked at least every 1-2 years. Ongoing high blood pressure should be treated with medicines if weight loss and exercise are not effective.  If you are 38-51 years old, ask your health care provider if you should take aspirin to prevent heart disease.  Diabetes screening involves taking a blood sample to check your fasting blood sugar level. This should be done once every 3 years after age 79 if you are at a normal weight and without risk factors for diabetes. Testing should be considered at a younger age or be carried out more frequently if you are overweight and have at least 1 risk factor for diabetes.  Colorectal cancer can be detected and often prevented. Most routine colorectal cancer screening begins at the age of 22 and continues through age 51. However, your health care provider may recommend screening at an earlier age if you have risk factors for colon cancer. On a yearly basis, your health care provider may provide home test kits to check for hidden blood in the stool. A small camera at the end of a tube may be used to directly examine the colon (sigmoidoscopy or colonoscopy) to detect the earliest forms of colorectal cancer.  Talk to your health care provider about this at age 2 when routine screening begins. A direct exam of the colon should be repeated every 5-10 years through age 14, unless early forms of precancerous polyps or small growths are found.  People who are at an increased risk for hepatitis B should be screened for this virus. You are considered at high risk for hepatitis B if:  You were born in a country where hepatitis B occurs often. Talk with your health care provider about which countries are considered high risk.  Your parents were born in a high-risk country and you have not received a shot to protect against hepatitis B (hepatitis B vaccine).  You have HIV or AIDS.  You use needles to inject street drugs.  You live with, or have sex with, someone who has hepatitis B.  You are a man who has sex with other men (MSM).  You get hemodialysis treatment.  You take certain medicines for conditions like cancer, organ transplantation, and autoimmune conditions.  Hepatitis C blood testing is recommended for all people born from [birth year] through 1965 and any individual with known risk factors for hepatitis C.  Healthy  men should no longer receive prostate-specific antigen (PSA) blood tests as part of routine cancer screening. Talk to your health care provider about prostate cancer screening.  Testicular cancer screening is not recommended for adolescents or adult males who have no symptoms. Screening includes self-exam, a health care provider exam, and other screening tests. Consult with your health care provider about any symptoms you have or any concerns you have about testicular cancer.  Practice safe sex. Use condoms and avoid high-risk sexual practices to reduce the spread of sexually transmitted infections (STIs).  You should be screened for STIs, including gonorrhea and chlamydia if:  You are sexually active and are younger than 24 years.  You are older than 24 years, and your health care  provider tells you that you are at risk for this type of infection.  Your sexual activity has changed since you were last screened, and you are at an increased risk for chlamydia or gonorrhea. Ask your health care provider if you are at risk.  If you are at risk of being infected with HIV, it is recommended that you take a prescription medicine daily to prevent HIV infection. This is called pre-exposure prophylaxis (PrEP). You are considered at risk if:  You are a man who has sex with other men (MSM).  You are a heterosexual man who is sexually active with multiple partners.  You take drugs by injection.  You are sexually active with a partner who has HIV.  Talk with your health care provider about whether you are at high risk of being infected with HIV. If you choose to begin PrEP, you should first be tested for HIV. You should then be tested every 3 months for as long as you are taking PrEP.  Use sunscreen. Apply sunscreen liberally and repeatedly throughout the day. You should seek shade when your shadow is shorter than you. Protect yourself by wearing long sleeves, pants, a wide-brimmed hat, and sunglasses year round whenever you are outdoors.  Tell your health care provider of new moles or changes in moles, especially if there is a change in shape or color. Also, tell your health care provider if a mole is larger than the size of a pencil eraser.  A one-time screening for abdominal aortic aneurysm (AAA) and surgical repair of large AAAs by ultrasound is recommended for men aged 16-75 years who are current or former smokers.  Stay current with your vaccines (immunizations). Document Released: 05/15/2008 Document Revised: 11/22/2013 Document Reviewed: 04/14/2011 [facility] Patient Information 2015 [place], [place]. This information is not intended to replace advice given to you by your health care provider. Make sure you discuss any questions you have with your health care provider.

## 2014-08-11 NOTE — Progress Notes (Signed)
Patient ID: Johnny Munoz, male   DOB: [birth year], 63 y.o.   MRN: [id number]   The patient is here for his annual  physical examination and management of other chronic and acute problems.Marland KitchenHe is under the care of urologist Dr Eulogio Ditch for annual  prostate exam and PSA screening due to history of BPH.,  His urinary symptoms  Have improved with Proscar and he is tolerating all of his medications without side effects.  He is exercising regularly.  He notes some joint stiffness in the  Morning upon rising which resolves with activity,  He has had some nocturnal muscle cramping that has improved with a daily banana  Discussed lipid trend,  Mediterranean diet., use of almond milk as an alternative to cows milk.     The risk factors are reflected in the social history.  The roster of all physicians providing medical care to patient - is listed in the Snapshot section of the chart.  Activities of daily living:  The patient is 100% independent in all ADLs: dressing, toileting, feeding as well as independent mobility  Home safety : The patient has smoke detectors in the home. He wears seatbelts.  There are no firearms at home. There is no violence in the home.   There is no risks for hepatitis, STDs or HIV. There is no   history of blood transfusion. There is no travel history to infectious disease endemic areas of the world.  The patient has seen their dentist in the last six month and  their eye doctor in the last year.  They do not  have excessive sun exposure. They have seen a dermatoloigist in the last year. Discussed the need for sun protection: hats, long sleeves and use of sunscreen if there is significant sun exposure.   Diet: the importance of a healthy diet is discussed. They do have a healthy diet.  The benefits of regular aerobic exercise were discussed. He exercises a minimum of 30 minutes  5 days per week. Depression screen: there are no signs or vegative symptoms of depression-  irritability, change in appetite, anhedonia, sadness/tearfullness.  The following portions of the patient's history were reviewed and updated as appropriate: allergies, current medications, past family history, past medical history,  past surgical history, past social history  and problem list.  Visual acuity was not assessed per patient preference since he has regular follow up with his ophthalmologist. Hearing and body mass index were assessed and reviewed.   During the course of the visit the patient was educated and counseled about appropriate screening and preventive services including :  nutrition counseling, colorectal cancer screening, and recommended immunizations.    Objective:  BP 122/68  Pulse 54  Temp(Src) 98.7 F (37.1 C) (Oral)  Resp 14  Ht 5\' 9"  (1.753 m)  Wt 145 lb 8 oz (65.998 kg)  BMI 21.48 kg/m2  SpO2 98%  BP 122/68  Pulse 54  Temp(Src) 98.7 F (37.1 C) (Oral)  Resp 14  Ht 5\' 9"  (1.753 m)  Wt 145 lb 8 oz (65.998 kg)  BMI 21.48 kg/m2  SpO2 98%  General Appearance:    Alert, cooperative, no distress, appears stated age  Head:    Normocephalic, without obvious abnormality, atraumatic  Eyes:    PERRL, conjunctiva/corneas clear, EOM's intact, fundi    benign, both eyes       Ears:    Normal TM's and external ear canals, both ears  Nose:   Nares normal, septum midline, mucosa normal,  no drainage   or sinus tenderness  Throat:   Lips, mucosa, and tongue normal; teeth and gums normal  Neck:   Supple, symmetrical, trachea midline, no adenopathy;       thyroid:  No enlargement/tenderness/nodules; no carotid   bruit or JVD  Back:     Symmetric, no curvature, ROM normal, no CVA tenderness  Lungs:     Clear to auscultation bilaterally, respirations unlabored  Chest wall:    No tenderness or deformity  Heart:    Regular rate and rhythm, S1 and S2 normal, no murmur, rub   or gallop  Abdomen:     Soft, non-tender, bowel sounds active all four quadrants,    no  masses, no organomegaly  Genitalia:    Deferred to urology per patient request   Rectal:    deferred per patient   Extremities:   Extremities normal, atraumatic, no cyanosis or edema  Pulses:   2+ and symmetric all extremities  Skin:   Skin color, texture, turgor normal, no rashes or lesions  Lymph nodes:   Cervical, supraclavicular, and axillary nodes normal  Neurologic:   CNII-XII intact. Normal strength, sensation and reflexes      throughout   Assessment and Plan:  Encounter for preventive health examination Annual exam was done excluding testicular and prostate exam. PSA is done annually by Urology .  Colon ca screening was updated with results of November colon[facility]  immunizations reviewed and options given.  handout given   Hyperlipidemia LDL goal <130 LDL and triglycerides are at goal on current medications. He has no side effects and liver enzymes are normal. No changes today.  Lab Results  Component Value Date   CHOL 175 08/11/2014   HDL 65.20 08/11/2014   LDLCALC 89 08/11/2014   LDLDIRECT 120.8 12/29/2013   TRIG 105.0 08/11/2014   CHOLHDL 3 08/11/2014   Lab Results  Component Value Date   ALT 43 08/11/2014   AST 29 08/11/2014   ALKPHOS 57 08/11/2014   BILITOT 0.6 08/11/2014     BPH with obstruction/lower urinary tract symptoms Improved symptoms since starting Proscar.    Updated Medication List Outpatient Encounter Prescriptions as of 08/11/2014  Medication Sig  . CRESTOR 5 MG tablet take 1 tablet by mouth once daily  . finasteride (PROSCAR) 5 MG tablet Take 5 mg by mouth daily.  . QNASL 80 MCG/ACT AERS 2 puffs into nostril daily  . [DISCONTINUED] polyethylene glycol powder (GLYCOLAX/MIRALAX) powder 255 grams one bottle for colonoscopy prep

## 2014-08-13 NOTE — Assessment & Plan Note (Signed)
LDL and triglycerides are at goal on current medications. He has no side effects and liver enzymes are normal. No changes today.  Lab Results  Component Value Date   CHOL 175 08/11/2014   HDL 65.20 08/11/2014   LDLCALC 89 08/11/2014   LDLDIRECT 120.8 12/29/2013   TRIG 105.0 08/11/2014   CHOLHDL 3 08/11/2014   Lab Results  Component Value Date   ALT 43 08/11/2014   AST 29 08/11/2014   ALKPHOS 57 08/11/2014   BILITOT 0.6 08/11/2014

## 2014-08-13 NOTE — Assessment & Plan Note (Signed)
Improved symptoms since starting Proscar.

## 2014-08-15 ENCOUNTER — Encounter: Payer: Self-pay | Admitting: Internal Medicine

## 2014-08-16 ENCOUNTER — Encounter: Payer: Self-pay | Admitting: Internal Medicine

## 2014-08-16 LAB — VARICELLA ZOSTER ABS, IGG/IGM: Varicella zoster IgG: <135 index — ABNORMAL LOW (ref >165)

## 2015-01-02 ENCOUNTER — Encounter: Payer: Self-pay | Admitting: Internal Medicine

## 2015-03-06 ENCOUNTER — Other Ambulatory Visit: Payer: Self-pay | Admitting: Internal Medicine

## 2015-04-24 ENCOUNTER — Other Ambulatory Visit: Payer: Self-pay | Admitting: Internal Medicine

## 2015-04-26 NOTE — Telephone Encounter (Signed)
Spoke with pt, he has [employer] and has his labs drawn at [facility].  He needs lab order sent to them.  Please advise

## 2015-04-26 NOTE — Telephone Encounter (Signed)
LETTER PRINTED,  RX SENT

## 2015-04-27 ENCOUNTER — Telehealth: Payer: Self-pay | Admitting: Internal Medicine

## 2015-04-27 NOTE — Telephone Encounter (Signed)
Johnny Pulse, Do you know what lab order he needs faxed?

## 2015-04-27 NOTE — Telephone Encounter (Signed)
See other encounter. Lab order has been faxed.

## 2015-04-27 NOTE — Telephone Encounter (Signed)
Pt called to give fax number [phone]) to where lab work order can be sent to occupational health.

## 2015-04-27 NOTE — Telephone Encounter (Signed)
Left message for pt to return my call on what he would like done with lab slip

## 2015-04-27 NOTE — Telephone Encounter (Signed)
Lab orders faxed.

## 2015-05-04 LAB — HEPATIC FUNCTION PANEL
ALT: 20 U/L (ref 10–40)
AST: 17 U/L (ref 14–40)
Alkaline Phosphatase: 62 U/L (ref 25–125)
Bilirubin, Total: 0.4 mg/dL

## 2015-05-04 LAB — BASIC METABOLIC PANEL
BUN: 19 mg/dL (ref 4–21)
CREATININE: 1 mg/dL (ref 0.6–1.3)
Glucose: 97 mg/dL
Potassium: 4.6 mmol/L (ref 3.4–5.3)
Sodium: 141 mmol/L (ref 137–147)

## 2015-05-04 LAB — LIPID PANEL
Cholesterol: 207 mg/dL — AB (ref 0–200)
HDL: 76 mg/dL — AB (ref 35–70)
LDL Cholesterol: 99 mg/dL
Triglycerides: 162 mg/dL — AB (ref 40–160)

## 2015-05-08 ENCOUNTER — Encounter: Payer: Self-pay | Admitting: *Deleted

## 2015-05-10 ENCOUNTER — Encounter: Payer: Self-pay | Admitting: *Deleted

## 2015-05-10 ENCOUNTER — Telehealth: Payer: Self-pay | Admitting: Internal Medicine

## 2015-05-10 NOTE — Telephone Encounter (Signed)
Letter mailed

## 2015-05-10 NOTE — Telephone Encounter (Signed)
Your cholesterol, liver and kidney function are normal.  You do not need any medication changes. Please plan to repeat the labs in 6 months.    Regards,   Dr. Wadsworth Skolnick  

## 2015-06-15 ENCOUNTER — Other Ambulatory Visit: Payer: Self-pay | Admitting: Internal Medicine

## 2015-08-04 ENCOUNTER — Other Ambulatory Visit: Payer: Self-pay | Admitting: Internal Medicine

## 2015-08-05 NOTE — Telephone Encounter (Signed)
Last OV 9.11.15, upcoming appoint 9.12.16.  Please advise refill

## 2015-08-06 NOTE — Telephone Encounter (Signed)
Refill for 30 days  And sent

## 2015-08-13 ENCOUNTER — Ambulatory Visit (INDEPENDENT_AMBULATORY_CARE_PROVIDER_SITE_OTHER): Payer: PRIVATE HEALTH INSURANCE | Admitting: Internal Medicine

## 2015-08-13 ENCOUNTER — Encounter: Payer: Self-pay | Admitting: Internal Medicine

## 2015-08-13 VITALS — BP 128/88 | HR 60 | Temp 98.5°F | Resp 12 | Ht 69.0 in | Wt 148.0 lb

## 2015-08-13 DIAGNOSIS — M159 Polyosteoarthritis, unspecified: Secondary | ICD-10-CM

## 2015-08-13 DIAGNOSIS — N401 Enlarged prostate with lower urinary tract symptoms: Secondary | ICD-10-CM

## 2015-08-13 DIAGNOSIS — M150 Primary generalized (osteo)arthritis: Principal | ICD-10-CM

## 2015-08-13 DIAGNOSIS — N138 Other obstructive and reflux uropathy: Secondary | ICD-10-CM

## 2015-08-13 DIAGNOSIS — Z0000 Encounter for general adult medical examination without abnormal findings: Secondary | ICD-10-CM

## 2015-08-13 NOTE — Assessment & Plan Note (Signed)
Annual wellness  exam was done as well as a comprehensive physical exam  .  During the course of the visit the patient was educated and counseled about appropriate screening and preventive services and screenings were brought up to date for prostate and Colon CA.  He will return for fasting labs to provide samples for diabetes screening and lipid analysis with projected  10 year  risk for CAD. nutrition counseling, skin cancer screening has been recommended, along with review of the age appropriate recommended immunizations.  Printed recommendations for health maintenance screenings was given.

## 2015-08-13 NOTE — Progress Notes (Signed)
Patient ID: Johnny Munoz, male    DOB: 10-12-1951  Age: 64 y.o. MRN: [id number] Medicare The patient is here for annual  wellness examination and management of other chronic and acute problems.    Eye exam and colonoscopy up to date   sees dahl[place]it annually  The risk factors are reflected in the social history.  The roster of all physicians providing medical care to patient - is listed in the Snapshot section of the chart.  Home safety : The patient has smoke detectors in the home. They wear seatbelts.  There are no firearms at home. There is no violence in the home.   There is no risks for hepatitis, STDs or HIV. There is no   history of blood transfusion. They have no travel history to infectious disease endemic areas of the world.  The patient has seen their dentist in the last six month. They have seen their eye doctor in the last year. They admit to slight hearing difficulty with regard to whispered voices and some television programs.  They have deferred audiologic testing in the last year.  They do not  have excessive sun exposure. Discussed the need for sun protection: hats, long sleeves and use of sunscreen if there is significant sun exposure.   Diet: the importance of a healthy diet is discussed. They do have a healthy diet.  The benefits of regular aerobic exercise were discussed. She walks 4 times per week ,  20 minutes.   Depression screen: there are no signs or vegative symptoms of depression- irritability, change in appetite, anhedonia, sadness/tearfullness.   The following portions of the patient's history were reviewed and updated as appropriate: allergies, current medications, past family history, past medical history,  past surgical history, past social history  and problem list.  Visual acuity was not assessed per patient preference since she has regular follow up with her ophthalmologist. Hearing and body mass index were assessed and reviewed.   During the  course of the visit the patient was educated and counseled about appropriate screening and preventive services including : fall prevention , diabetes screening, nutrition counseling, colorectal cancer screening, and recommended immunizations.    CC: The primary encounter diagnosis was Primary osteoarthritis involving multiple joints. Diagnoses of Encounter for preventive health examination and BPH with obstruction/lower urinary tract symptoms were also pertinent to this visit.  [name] has a past medical history of Arthritis; Allergy; Hyperlipidemia; and BPH (benign prostatic hyperplasia).   He has past surgical history that includes Trans Urethral Microwave Therapy and Colonoscopy (2003).   His family history includes Diabetes in his father; Heart disease in his father. There is no history of Cancer.He reports that he has never smoked. He has never used smokeless tobacco. He reports that he drinks about 1.5 oz of alcohol per week. He reports that he does not use illicit drugs.  Outpatient Prescriptions Prior to Visit  Medication Sig Dispense Refill  . finasteride (PROSCAR) 5 MG tablet Take 5 mg by mouth daily.    . QNASL 80 MCG/ACT AERS 2 puffs into nostril daily    . rosuvastatin (CRESTOR) 5 MG tablet take 1 tablet by mouth once daily 30 tablet 0   No facility-administered medications prior to visit.    Review of Systems   Patient denies headache, fevers, malaise, unintentional weight loss, skin rash, eye pain, sinus congestion and sinus pain, sore throat, dysphagia,  hemoptysis , cough, dyspnea, wheezing, chest pain, palpitations, orthopnea, edema, abdominal pain, nausea, melena, diarrhea, constipation,  flank pain, dysuria, hematuria, urinary  Frequency, nocturia, numbness, tingling, seizures,  Focal weakness, Loss of consciousness,  Tremor, insomnia, depression, anxiety, and suicidal ideation.      Objective:  BP 128/88 mmHg  Pulse 60  Temp(Src) 98.5 F (36.9 C) (Oral)   Resp 12  Ht 5\' 9"  (1.753 m)  Wt 148 lb (67.132 kg)  BMI 21.85 kg/m2  SpO2 97%  Physical Exam   General appearance: alert, cooperative and appears stated age Ears: normal TM's and external ear canals both ears Throat: lips, mucosa, and tongue normal; teeth and gums normal Neck: no adenopathy, no carotid bruit, supple, symmetrical, trachea midline and thyroid not enlarged, symmetric, no tenderness/mass/nodules Back: symmetric, no curvature. ROM normal. No CVA tenderness. Lungs: clear to auscultation bilaterally Heart: regular rate and rhythm, S1, S2 normal, no murmur, click, rub or gallop Abdomen: soft, non-tender; bowel sounds normal; no masses,  no organomegaly Pulses: 2+ and symmetric Skin: Skin color, texture, turgor normal. No rashes or lesions Lymph nodes: Cervical, supraclavicular, and axillary nodes normal.    Assessment & Plan:   Problem List Items Addressed This Visit      Unprioritized   Osteoarthritis - Primary    Affecting DIPs.  managed with twice weekly aleve       BPH with obstruction/lower urinary tract symptoms    Managed by urology  Johnny Munoz        Encounter for preventive health examination    Annual wellness  exam was done as well as a comprehensive physical exam  .  During the course of the visit the patient was educated and counseled about appropriate screening and preventive services and screenings were brought up to date for prostate and Colon CA.  He will return for fasting labs to provide samples for diabetes screening and lipid analysis with projected  10 year  risk for CAD. nutrition counseling, skin cancer screening has been recommended, along with review of the age appropriate recommended immunizations.  Printed recommendations for health maintenance screenings was given.           I am having Johnny Munoz maintain his QNASL, finasteride, and rosuvastatin.  No orders of the defined types were placed in this encounter.    There  are no discontinued medications.  Follow-up: No Follow-up on file.   [name], MD

## 2015-08-13 NOTE — Progress Notes (Signed)
Pre-visit discussion using our clinic review tool. No additional management support is needed unless otherwise documented below in the visit note.  

## 2015-08-13 NOTE — Assessment & Plan Note (Addendum)
Affecting DIPs.  managed with twice weekly aleve

## 2015-08-13 NOTE — Patient Instructions (Signed)

## 2015-08-13 NOTE — Assessment & Plan Note (Signed)
Managed by urology  Lester Kinsman

## 2015-08-20 LAB — HEPATIC FUNCTION PANEL
ALT: 26 U/L (ref 10–40)
AST: 20 U/L (ref 14–40)
Alkaline Phosphatase: 68 U/L (ref 25–125)
BILIRUBIN, TOTAL: 0.2 mg/dL

## 2015-08-20 LAB — LIPID PANEL
Cholesterol: 182 mg/dL (ref 0–200)
HDL: 71 mg/dL — AB (ref 35–70)
LDL Cholesterol: 77 mg/dL
Triglycerides: 171 mg/dL — AB (ref 40–160)

## 2015-08-20 LAB — BASIC METABOLIC PANEL
BUN: 15 mg/dL (ref 4–21)
Creatinine: 0.9 mg/dL (ref 0.6–1.3)
Glucose: 1033 mg/dL
Potassium: 4.3 mmol/L (ref 3.4–5.3)
SODIUM: 142 mmol/L (ref 137–147)

## 2015-08-24 ENCOUNTER — Encounter: Payer: Self-pay | Admitting: Internal Medicine

## 2015-08-27 ENCOUNTER — Telehealth: Payer: Self-pay | Admitting: Internal Medicine

## 2015-08-27 ENCOUNTER — Encounter: Payer: Self-pay | Admitting: *Deleted

## 2015-08-27 NOTE — Telephone Encounter (Signed)
Letter mailed for normal labs.

## 2015-08-27 NOTE — Telephone Encounter (Signed)
Your cholesterol, PSA, liver and kidney function are normal.    Regards,   Dr. Derrel Nip

## 2015-09-08 ENCOUNTER — Other Ambulatory Visit: Payer: Self-pay | Admitting: Internal Medicine

## 2015-09-12 ENCOUNTER — Ambulatory Visit: Payer: No Typology Code available for payment source | Admitting: Physician Assistant

## 2015-09-13 ENCOUNTER — Telehealth: Payer: Self-pay | Admitting: *Deleted

## 2015-09-13 MED ORDER — ROSUVASTATIN CALCIUM 5 MG PO TABS: 5.0000 mg | ORAL_TABLET | Freq: Every day | ORAL | Status: DC

## 2015-09-13 NOTE — Telephone Encounter (Signed)
Patient requested a medication refill for rosuvastatin. -thanks

## 2015-09-13 NOTE — Telephone Encounter (Signed)
Refill sent as requested, 

## 2015-09-14 ENCOUNTER — Ambulatory Visit: Payer: Self-pay | Admitting: Physician Assistant

## 2015-09-14 DIAGNOSIS — Z299 Encounter for prophylactic measures, unspecified: Secondary | ICD-10-CM

## 2015-12-14 ENCOUNTER — Ambulatory Visit (INDEPENDENT_AMBULATORY_CARE_PROVIDER_SITE_OTHER): Payer: Managed Care, Other (non HMO) | Admitting: Urology

## 2015-12-14 ENCOUNTER — Encounter: Payer: Self-pay | Admitting: Urology

## 2015-12-14 VITALS — BP 130/86 | HR 57 | Ht 69.0 in | Wt 147.1 lb

## 2015-12-14 DIAGNOSIS — N401 Enlarged prostate with lower urinary tract symptoms: Secondary | ICD-10-CM | POA: Diagnosis not present

## 2015-12-14 DIAGNOSIS — Z87898 Personal history of other specified conditions: Secondary | ICD-10-CM | POA: Diagnosis not present

## 2015-12-14 DIAGNOSIS — N138 Other obstructive and reflux uropathy: Secondary | ICD-10-CM

## 2015-12-14 MED ORDER — FINASTERIDE 5 MG PO TABS: 5.0000 mg | ORAL_TABLET | Freq: Every day | ORAL | Status: DC

## 2015-12-14 NOTE — Progress Notes (Signed)
12/14/2015 3:32 PM   Johnny Munoz [birth year] [id number]  Referring provider: [name], MD [place], [place] [address]  Chief Complaint  Patient presents with  . Benign Prostatic Hypertrophy    [facility]    HPI: Patient is a 65 year old Caucasian male with a history of elevated PSA and BPH with LUTS who would like to establish care with a local urologist.  History of elevated PSA Patient states about 4 years ago he underwent a prostate biopsy for a PSA level that  4.1  with Dr. [name] at the [facility] urology office. He states the biopsy results were benign.  His most recent PSA is 1.2 on 08/07/2015.  BPH WITH LUTS His IPSS score today is 8, which is moderate lower urinary tract symptomatology. He is pleased with his quality life due to his urinary symptoms. He denies any dysuria, hematuria or suprapubic pain.  He currently taking finasteride 5 mg daily.  He underwent a TUMT with Dr. [name] in 2008.  He also denies any recent fevers, chills, nausea or vomiting.  He has a family history of PCa, with a maternal uncle with prostate cancer.        IPSS      12/14/15 0800       International Prostate Symptom Score   How often have you had the sensation of not emptying your bladder? Less than 1 in 5     How often have you had to urinate less than every two hours? Less than half the time     How often have you found you stopped and started again several times when you urinated? Less than 1 in 5 times     How often have you found it difficult to postpone urination? Less than 1 in 5 times     How often have you had a weak urinary stream? Less than 1 in 5 times     How often have you had to strain to start urination? Less than 1 in 5 times     How many times did you typically get up at night to urinate? 1 Time     Total IPSS Score 8     Quality of Life due to urinary symptoms   If you were to spend the rest of  your life with your urinary condition just the way it is now how would you feel about that? Pleased        Score:  1-7 Mild 8-19 Moderate 20-35 Severe     PMH: Past Medical History  Diagnosis Date  . Arthritis   . Allergy   . Hyperlipidemia   . BPH (benign prostatic hyperplasia)     Surgical History: Past Surgical History  Procedure Laterality Date  . Trans urethral microwave therapy    . Colonoscopy  2003    Dr. [name]    Home Medications:    Medication List       This list is accurate as of: 12/14/15 11:59 PM.  Always use your most recent med list.               finasteride 5 MG tablet  Commonly known as:  PROSCAR  Take 1 tablet (5 mg total) by mouth daily.     ibuprofen 200 MG tablet  Commonly known as:  ADVIL,MOTRIN  Take 200 mg by mouth every 6 (six) hours as needed.     OSTEO BI-FLEX JOINT SHIELD  PO  Take by mouth.     QNASL 80 MCG/ACT Aers  Generic drug:  Beclomethasone Dipropionate  2 puffs into nostril daily     rosuvastatin 5 MG tablet  Commonly known as:  CRESTOR  Take 1 tablet (5 mg total) by mouth daily.        Allergies: No Known Allergies  Family History: Family History  Problem Relation Age of Onset  . Diabetes Father   . Heart disease Father     CABG after 65  . Prostate cancer Maternal Uncle     Social History:  reports that he has never smoked. He has never used smokeless tobacco. He reports that he drinks about 1.5 oz of alcohol per week. He reports that he does not use illicit drugs.  ROS: UROLOGY Frequent Urination?: No Hard to postpone urination?: No Burning/pain with urination?: No Get up at night to urinate?: Yes Leakage of urine?: No Urine stream starts and stops?: No Trouble starting stream?: No Do you have to strain to urinate?: No Blood in urine?: No Urinary tract infection?: No Sexually transmitted disease?: No Injury to kidneys or bladder?: No Painful intercourse?: No Weak stream?: No Erection  problems?: No Penile pain?: No  Gastrointestinal Nausea?: No Vomiting?: No Indigestion/heartburn?: No Diarrhea?: No Constipation?: No  Constitutional Fever: No Night sweats?: No Weight loss?: No Fatigue?: No  Skin Skin rash/lesions?: No Itching?: No  Eyes Blurred vision?: Yes Double vision?: No  Ears/Nose/Throat Sore throat?: No Sinus problems?: Yes  Hematologic/Lymphatic Swollen glands?: No Easy bruising?: No  Cardiovascular Leg swelling?: No Chest pain?: No  Respiratory Cough?: No Shortness of breath?: No  Endocrine Excessive thirst?: No  Musculoskeletal Back pain?: No Joint pain?: Yes  Neurological Headaches?: No Dizziness?: No  Psychologic Depression?: No Anxiety?: No  Physical Exam: BP 130/86 mmHg  Pulse 57  Ht 5\' 9"  (1.753 m)  Wt 147 lb 1.6 oz (66.724 kg)  BMI 21.71 kg/m2  Constitutional: Well nourished. Alert and oriented, No acute distress. HEENT: [place] AT, moist mucus membranes. Trachea midline, no masses. Cardiovascular: No clubbing, cyanosis, or edema. Respiratory: Normal respiratory effort, no increased work of breathing. GI: Abdomen is soft, non tender, non distended, no abdominal masses. Liver and spleen not palpable.  No hernias appreciated.  Stool sample for occult testing is not indicated.   GU: No CVA tenderness.  No bladder fullness or masses.  Patient with circumcised phallus.   Urethral meatus is patent.  No penile discharge. No penile lesions or rashes. Scrotum without lesions, cysts, rashes and/or edema.  Testicles are located scrotally bilaterally. No masses are appreciated in the testicles. Left and right epididymis are normal. Rectal: Patient with  normal sphincter tone. Anus and perineum without scarring or rashes. No rectal masses are appreciated. Prostate is approximately 50 grams, no nodules are appreciated. Seminal vesicles are normal. Skin: No rashes, bruises or suspicious lesions. Lymph: No cervical or inguinal  adenopathy. Neurologic: Grossly intact, no focal deficits, moving all 4 extremities. Psychiatric: Normal mood and affect.  Laboratory Data:   Lab Results  Component Value Date   CREATININE 0.9 08/20/2015    Lab Results  Component Value Date   PSA 2.11 06/28/2013  PSA   1.2   08/2015  Lab Results  Component Value Date   TSH 1.80 12/29/2013     Assessment & Plan:    1. History of elevated PSA:   Patient underwent a biopsy of his prostate in November 2012 for a PSA of 4.1. His biopsy was  benign. His most recent PSA was 1.2 in September 2016. We will continue to monitor. He will return in 1 year for I PSS score, exam and PSA.   2. BPH (benign prostatic hyperplasia) with LUTS:   IPSS score is 8/1.   He would like to continue the finasteride. I have sent a refill for the finasteride to his pharmacist. We will continue to monitor. He will present in one year for I PSS score, exam and PSA.  Return in about 1 year (around 12/13/2016) for IPSS and exam.  These notes generated with voice recognition software. I apologize for typographical errors.  Zara Council, [facility] [address] [place], [place] [address] [phone]

## 2015-12-15 DIAGNOSIS — Z87898 Personal history of other specified conditions: Secondary | ICD-10-CM | POA: Insufficient documentation

## 2016-03-05 ENCOUNTER — Telehealth: Payer: Self-pay | Admitting: Internal Medicine

## 2016-03-05 DIAGNOSIS — Z79899 Other long term (current) drug therapy: Secondary | ICD-10-CM

## 2016-03-05 DIAGNOSIS — Z7289 Other problems related to lifestyle: Secondary | ICD-10-CM

## 2016-03-05 DIAGNOSIS — Z125 Encounter for screening for malignant neoplasm of prostate: Secondary | ICD-10-CM

## 2016-03-05 NOTE — Telephone Encounter (Signed)
Can you please advise patient, and schedule. thanks

## 2016-03-05 NOTE — Telephone Encounter (Signed)
Noted  

## 2016-03-05 NOTE — Telephone Encounter (Signed)
Yes, non fasting labs every 6 months.  Ordered

## 2016-03-05 NOTE — Telephone Encounter (Signed)
Pt called wanting to know if he is due for lab work any time soon? Call pt @ [phone]. Thank you!

## 2016-03-05 NOTE — Telephone Encounter (Signed)
Pt is scheduled for non fasting lab

## 2016-03-05 NOTE — Telephone Encounter (Signed)
Last Office visit was 08/13/15 and he followed up with labs on 08/20/15.  Please advise if he needs them rechecked. Thanks

## 2016-03-06 ENCOUNTER — Other Ambulatory Visit (INDEPENDENT_AMBULATORY_CARE_PROVIDER_SITE_OTHER): Payer: Managed Care, Other (non HMO)

## 2016-03-06 DIAGNOSIS — Z79899 Other long term (current) drug therapy: Secondary | ICD-10-CM | POA: Diagnosis not present

## 2016-03-06 DIAGNOSIS — Z7289 Other problems related to lifestyle: Secondary | ICD-10-CM

## 2016-03-06 DIAGNOSIS — Z125 Encounter for screening for malignant neoplasm of prostate: Secondary | ICD-10-CM | POA: Diagnosis not present

## 2016-03-06 LAB — COMPREHENSIVE METABOLIC PANEL
ALK PHOS: 64 U/L (ref 39–117)
ALT: 22 U/L (ref 0–53)
AST: 17 U/L (ref 0–37)
Albumin: 4.6 g/dL (ref 3.5–5.2)
BUN: 16 mg/dL (ref 6–23)
CALCIUM: 9.3 mg/dL (ref 8.4–10.5)
CHLORIDE: 102 meq/L (ref 96–112)
CO2: 32 mEq/L (ref 19–32)
CREATININE: 0.89 mg/dL (ref 0.40–1.50)
GFR: 91.34 mL/min (ref >60.00)
Glucose, Bld: 79 mg/dL (ref 70–99)
POTASSIUM: 4.6 meq/L (ref 3.5–5.1)
Sodium: 140 mEq/L (ref 135–145)
Total Bilirubin: 0.4 mg/dL (ref 0.2–1.2)
Total Protein: 6.7 g/dL (ref 6.0–8.3)

## 2016-03-06 LAB — PSA: PSA: 0.75 ng/mL (ref 0.10–4.00)

## 2016-03-07 LAB — HEPATITIS C ANTIBODY: HCV AB: NEGATIVE

## 2016-03-07 LAB — HIV ANTIBODY (ROUTINE TESTING W REFLEX): HIV: NONREACTIVE

## 2016-03-09 ENCOUNTER — Encounter: Payer: Self-pay | Admitting: Internal Medicine

## 2016-05-29 ENCOUNTER — Other Ambulatory Visit: Payer: Self-pay

## 2016-05-29 MED ORDER — ROSUVASTATIN CALCIUM 5 MG PO TABS: 5.0000 mg | ORAL_TABLET | Freq: Every day | ORAL | Status: DC

## 2016-08-20 ENCOUNTER — Encounter: Payer: Self-pay | Admitting: Internal Medicine

## 2016-08-20 ENCOUNTER — Ambulatory Visit (INDEPENDENT_AMBULATORY_CARE_PROVIDER_SITE_OTHER): Payer: Managed Care, Other (non HMO) | Admitting: Internal Medicine

## 2016-08-20 DIAGNOSIS — E785 Hyperlipidemia, unspecified: Secondary | ICD-10-CM

## 2016-08-20 DIAGNOSIS — G250 Essential tremor: Secondary | ICD-10-CM

## 2016-08-20 DIAGNOSIS — R252 Cramp and spasm: Secondary | ICD-10-CM

## 2016-08-20 DIAGNOSIS — G4700 Insomnia, unspecified: Secondary | ICD-10-CM | POA: Diagnosis not present

## 2016-08-20 DIAGNOSIS — Z0000 Encounter for general adult medical examination without abnormal findings: Secondary | ICD-10-CM

## 2016-08-20 NOTE — Progress Notes (Signed)
Patient ID: Johnny Munoz, male    DOB: March 07, 1951  Age: 65 y.o. MRN: [id number]  The patient is here for annual physical examination and management of other chronic and acute problems.   The risk factors are reflected in the social history.  The roster of all physicians providing medical care to patient - is listed in the Snapshot section of the chart. Home safety : The patient has smoke detectors in the home. They wear seatbelts.  There are no firearms at home. There is no violence in the home.   There is no risks for hepatitis, STDs or HIV. There is no   history of blood transfusion. They have no travel history to infectious disease endemic areas of the world.  The patient has seen their dentist in the last six month. They have seen their eye doctor in the last year. They admit to slight hearing difficulty with regard to whispered voices and some television programs.  They have deferred audiologic testing in the last year.  They do not  have excessive sun exposure. Discussed the need for sun protection: hats, long sleeves and use of sunscreen if there is significant sun exposure.   Diet: the importance of a healthy diet is discussed. They do have a healthy diet.  The benefits of regular aerobic exercise were discussed. She walks 4 times per week ,  20 minutes.   Depression screen: there are no signs or vegative symptoms of depression- irritability, change in appetite, anhedonia, sadness/tearfullness.  The following portions of the patient's history were reviewed and updated as appropriate: allergies, current medications, past family history, past medical history,  past surgical history, past social history  and problem list.  Visual acuity was not assessed per patient preference since she has regular follow up with her ophthalmologist. Hearing and body mass index were assessed and reviewed.   During the course of the visit the patient was educated and counseled about appropriate  screening and preventive services including : fall prevention , diabetes screening, nutrition counseling, colorectal cancer screening, and recommended immunizations.    CC: Diagnoses of Insomnia, Muscle cramps at night, Benign essential tremor, Hyperlipidemia LDL goal <130, and Encounter for preventive health examination were pertinent to this visit.   1) insomnia.  averaging 4 to 5 hours.  Cat naps for 15 minutes 1-2 times daily. Does not snore . Sleep latency ranges from 20 to 30  Minutes,  Goes to bed 1 am  . Reads books,  Watches TV prior to bed.  No caffeine after lunch, drinks decaf green tea decaf coffee. Out of bed by 6 am uses  computer   Started melatonin 3 mg 3 weeks ago  At 8 or 9 Drinks wine  2) calf cramps every night.  Exercises daily, no calf cramps during exericse.  3) joint pain involving PIPs of right hand .  No swelling, redness or deformities.    4) tremor of hands;  Not occurring daily.  No loss of balance. Or difficulty initiating movement.   History [name] has a past medical history of Allergy; Arthritis; BPH (benign prostatic hyperplasia); and Hyperlipidemia.   He has a past surgical history that includes Trans Urethral Microwave Therapy and Colonoscopy (2003).   His family history includes Diabetes in his father; Heart disease in his father; Prostate cancer in his maternal uncle.He reports that he has never smoked. He has never used smokeless tobacco. He reports that he drinks about 1.5 oz of alcohol per week . He reports  that he does not use drugs.  Outpatient Medications Prior to Visit  Medication Sig Dispense Refill  . finasteride (PROSCAR) 5 MG tablet Take 1 tablet (5 mg total) by mouth daily. 30 tablet 12  . ibuprofen (ADVIL,MOTRIN) 200 MG tablet Take 200 mg by mouth every 6 (six) hours as needed.    . Misc Natural Products (OSTEO BI-FLEX JOINT SHIELD PO) Take by mouth.    . QNASL 80 MCG/ACT AERS 2 puffs into nostril daily    . rosuvastatin (CRESTOR) 5 MG  tablet Take 1 tablet (5 mg total) by mouth daily. 30 tablet 5   No facility-administered medications prior to visit.     Review of Systems   Patient denies headache, fevers, malaise, unintentional weight loss, skin rash, eye pain, sinus congestion and sinus pain, sore throat, dysphagia,  hemoptysis , cough, dyspnea, wheezing, chest pain, palpitations, orthopnea, edema, abdominal pain, nausea, melena, diarrhea, constipation, flank pain, dysuria, hematuria, urinary  Frequency, nocturia, numbness, tingling, seizures,  Focal weakness, Loss of consciousness,  Tremor, insomnia, depression, anxiety, and suicidal ideation.      Objective:  BP 112/72   Pulse 69   Temp 98.5 F (36.9 C) (Oral)   Ht 5\' 6"  (1.676 m)   Wt 146 lb (66.2 kg)   SpO2 95%   BMI 23.57 kg/m   Physical Exam   General appearance: alert, cooperative and appears stated age Ears: normal TM's and external ear canals both ears Throat: lips, mucosa, and tongue normal; teeth and gums normal Neck: no adenopathy, no carotid bruit, supple, symmetrical, trachea midline and thyroid not enlarged, symmetric, no tenderness/mass/nodules Back: symmetric, no curvature. ROM normal. No CVA tenderness. Lungs: clear to auscultation bilaterally Heart: regular rate and rhythm, S1, S2 normal, no murmur, click, rub or gallop Abdomen: soft, non-tender; bowel sounds normal; no masses,  no organomegaly Pulses: 2+ and symmetric Skin: Skin color, texture, turgor normal. No rashes or lesions Lymph nodes: Cervical, supraclavicular, and axillary nodes normal.    Assessment & Plan:   Problem List Items Addressed This Visit    Hyperlipidemia LDL goal <130    Managed with Crestor. He is overdue for lipids  Lab Results  Component Value Date   CHOL 182 08/20/2015   HDL 71 (A) 08/20/2015   LDLCALC 77 08/20/2015   LDLDIRECT 120.8 12/29/2013   TRIG 171 (A) 08/20/2015   CHOLHDL 3 08/11/2014         Encounter for preventive health examination     Annual comprehensive preventive exam was done as well as an evaluation and management of acute and chronic conditions .  During the course of the visit the patient was educated and counseled about appropriate screening and preventive services including :  diabetes screening, lipid analysis with projected  10 year  risk for CAD , nutrition counseling, prostate and colorectal cancer screening, and recommended immunizations.  Printed recommendations for health maintenance screenings was given.       Insomnia    Discussed natural remedies for insomnia including herbal tea and higher dose of  melatonin.  Reviewd principles of good sleep hygiene.        Muscle cramps at night    Trial of tonic water recommended,  Vs turmeric and Epsom salt soaks       Benign essential tremor    Normal neuro exam.  Reassurance  provided.       Other Visit Diagnoses   None.     I am having Mr. Szuch maintain his QNASL,  Misc Natural Products (OSTEO BI-FLEX JOINT SHIELD PO), ibuprofen, finasteride, and rosuvastatin.  No orders of the defined types were placed in this encounter.   There are no discontinued medications.  Follow-up: Return in about 6 months (around 02/17/2017).   [name], MD

## 2016-08-20 NOTE — Patient Instructions (Signed)
For your leg cramps:  Try drinking 1-2 ounces of tonic water before bed  For your insomnia:  Increase melatonin to 6 mg daily EVERY NIGHT AROUND 9 PM  Get some morning sunshine (not artificial light) Turn off all I pads and iphones (anything with a blue lit background) 2 hours before bedtime  If no improvement  In 3 weeks, call for medication trial   Your tremor may be due to muscle fatigue or Benign Essential Tremor  Essential Tremor A tremor is trembling or shaking that you cannot control. Most tremors affect the hands or arms. Tremors can also affect the head, vocal cords, face, and other parts of the body.  Essential tremor is a tremor without a known cause.  CAUSES Essential tremor has no known cause.  RISK FACTORS You may be at greater risk of essential tremor if:   You have a family member with essential tremor.   You are age 65 or older.   You take certain medicines. SIGNS AND SYMPTOMS The main sign of a tremor is uncontrolled and unintentional rhythmic shaking of a body part.  You may have difficulty eating with a spoon or fork.   You may have difficulty writing.   You may nod your head up and down or side to side.   You may have a quivering voice.  Your tremors:  May get worse over time.   May come and go.   May be more noticeable on one side of your body.   May get worse due to stress, fatigue, caffeine, and extreme heat or cold.  DIAGNOSIS Your health care provider can diagnose essential tremor based on your symptoms, medical history, and a physical examination. There is no single test to diagnose an essential tremor. However, your health care provider may perform a variety of tests to rule out other conditions. Tests may include:   Blood and urine tests.   Imaging studies of your brain, such as:   CT scan.   MRI.   A test that measures involuntary muscle movement (electromyogram). TREATMENT Your tremors may go away without  treatment. Mild tremors may not need treatment if they do not affect your day-to-day life. Severe tremors may need to be treated using one or a combination of the following options:   Medicines. This may include medicine that is injected.  Lifestyle changes.   Physical therapy.  HOME CARE INSTRUCTIONS  Take medicines only as directed by your health care provider.   Limit alcohol intake to no more than 1 drink per day for nonpregnant women and 2 drinks per day for men. One drink equals 12 oz of beer, 5 oz of wine, or 1 oz of hard liquor.  Do not use any tobacco products, including cigarettes, chewing tobacco, or electronic cigarettes. If you need help quitting, ask your health care provider.  Take medicines only as directed by your health care provider.   Avoid extreme heat or cold.   Limit the amount of caffeine you consumeas directed by your health care provider.   Try to get eight hours of sleep each night.  Find ways to manage your stress, such as meditation or yoga.  Keep all follow-up visits as directed by your health care provider. This is important. This includes any physical therapy visits. SEEK MEDICAL CARE IF:  You experience any changes in the location or intensity of your tremors.   You start having a tremor after starting a new medicine.   You have tremor with  other symptoms such as:   Numbness.   Tingling.   Pain.   Weakness.   Your tremor gets worse.   Your tremor interferes with your daily life.    This information is not intended to replace advice given to you by your health care provider. Make sure you discuss any questions you have with your health care provider.   Document Released: 12/08/2014 Document Reviewed: 12/08/2014 Elsevier Interactive Patient Education [employer].

## 2016-08-20 NOTE — Progress Notes (Signed)
Pre visit review using our clinic review tool, if applicable. No additional management support is needed unless otherwise documented below in the visit note. 

## 2016-08-21 DIAGNOSIS — G4700 Insomnia, unspecified: Secondary | ICD-10-CM | POA: Insufficient documentation

## 2016-08-21 DIAGNOSIS — R252 Cramp and spasm: Secondary | ICD-10-CM | POA: Insufficient documentation

## 2016-08-21 DIAGNOSIS — G250 Essential tremor: Secondary | ICD-10-CM | POA: Insufficient documentation

## 2016-08-21 NOTE — Assessment & Plan Note (Addendum)
Managed with Crestor. He is overdue for lipids  Lab Results  Component Value Date   CHOL 182 08/20/2015   HDL 71 (A) 08/20/2015   LDLCALC 77 08/20/2015   LDLDIRECT 120.8 12/29/2013   TRIG 171 (A) 08/20/2015   CHOLHDL 3 08/11/2014

## 2016-08-21 NOTE — Assessment & Plan Note (Addendum)
Discussed natural remedies for insomnia including herbal tea and higher dose of  melatonin.  Reviewd principles of good sleep hygiene.

## 2016-08-21 NOTE — Assessment & Plan Note (Addendum)
Normal neuro exam.  Reassurance  provided.

## 2016-08-21 NOTE — Assessment & Plan Note (Signed)

## 2016-08-21 NOTE — Assessment & Plan Note (Signed)
Trial of tonic water recommended,  Vs turmeric and Epsom salt soaks

## 2016-08-22 ENCOUNTER — Other Ambulatory Visit: Payer: Self-pay | Admitting: Physician Assistant

## 2016-08-22 DIAGNOSIS — Z299 Encounter for prophylactic measures, unspecified: Secondary | ICD-10-CM

## 2016-08-22 NOTE — Progress Notes (Signed)
Patient came in to have blood drawn for testing per Dr. Tullo's orders. 

## 2016-08-26 ENCOUNTER — Encounter: Payer: Self-pay | Admitting: Internal Medicine

## 2016-08-27 LAB — CMP12+LP+TP+TSH+6AC+PSA+CBC…
ALT: 18 IU/L (ref 0–44)
AST: 15 IU/L (ref 0–40)
Albumin/Globulin Ratio: 2 (ref 1.2–2.2)
Albumin: 4.4 g/dL (ref 3.6–4.8)
Alkaline Phosphatase: 68 IU/L (ref 39–117)
BUN/Creatinine Ratio: 17 (ref 10–24)
BUN: 14 mg/dL (ref 8–27)
Basophils Absolute: 0 x10E3/uL (ref 0.0–0.2)
Basos: 1 %
Bilirubin Total: 0.5 mg/dL (ref 0.0–1.2)
Calcium: 9.3 mg/dL (ref 8.6–10.2)
Chloride: 102 mmol/L (ref 96–106)
Chol/HDL Ratio: 2.3 ratio (ref 0.0–5.0)
Cholesterol, Total: 183 mg/dL (ref 100–199)
Creatinine, Ser: 0.82 mg/dL (ref 0.76–1.27)
EOS (ABSOLUTE): 0.3 x10E3/uL (ref 0.0–0.4)
Eos: 5 %
Free Thyroxine Index: 1.5 (ref 1.2–4.9)
GFR calc Af Amer: 108 mL/min/1.73
GFR calc non Af Amer: 93 mL/min/1.73
GGT: 15 IU/L (ref 0–65)
Globulin, Total: 2.2 g/dL (ref 1.5–4.5)
Glucose: 88 mg/dL (ref 65–99)
HDL: 80 mg/dL
Hematocrit: 46 % (ref 37.5–51.0)
Hemoglobin: 15.8 g/dL (ref 12.6–17.7)
Immature Grans (Abs): 0 x10E3/uL (ref 0.0–0.1)
Immature Granulocytes: 0 %
Iron: 90 ug/dL (ref 38–169)
LDH: 146 IU/L (ref 121–224)
LDL Calculated: 81 mg/dL (ref 0–99)
Lymphocytes Absolute: 1.9 x10E3/uL (ref 0.7–3.1)
Lymphs: 31 %
MCH: 31.5 pg (ref 26.6–33.0)
MCHC: 34.3 g/dL (ref 31.5–35.7)
MCV: 92 fL (ref 79–97)
Monocytes Absolute: 0.6 x10E3/uL (ref 0.1–0.9)
Monocytes: 9 %
Neutrophils Absolute: 3.5 x10E3/uL (ref 1.4–7.0)
Neutrophils: 54 %
Phosphorus: 3.5 mg/dL (ref 2.5–4.5)
Platelets: 255 x10E3/uL (ref 150–379)
Potassium: 4.9 mmol/L (ref 3.5–5.2)
Prostate Specific Ag, Serum: 1 ng/mL (ref 0.0–4.0)
RBC: 5.02 x10E6/uL (ref 4.14–5.80)
RDW: 14.5 % (ref 12.3–15.4)
Sodium: 141 mmol/L (ref 134–144)
T3 Uptake Ratio: 24 % (ref 24–39)
T4, Total: 6.4 ug/dL (ref 4.5–12.0)
TSH: 2.41 u[IU]/mL (ref 0.450–4.500)
Total Protein: 6.6 g/dL (ref 6.0–8.5)
Triglycerides: 111 mg/dL (ref 0–149)
Uric Acid: 4.8 mg/dL (ref 3.7–8.6)
VLDL Cholesterol Cal: 22 mg/dL (ref 5–40)
WBC: 6.4 x10E3/uL (ref 3.4–10.8)

## 2016-08-27 LAB — CBC AND DIFFERENTIAL
HEMATOCRIT: 46 % (ref 41–53)
HEMOGLOBIN: 15.8 g/dL (ref 13.5–17.5)
Neutrophils Absolute: 4 /uL
Platelets: 255 10*3/uL (ref 150–399)
WBC: 6.4 10^3/mL

## 2016-08-27 LAB — HEPATIC FUNCTION PANEL
ALT: 18 U/L (ref 10–40)
AST: 15 U/L (ref 14–40)
Alkaline Phosphatase: 68 U/L (ref 25–125)
Bilirubin, Total: 0.5 mg/dL

## 2016-08-27 LAB — LIPID PANEL
CHOLESTEROL: 183 mg/dL (ref 0–200)
HDL: 80 mg/dL — AB (ref 35–70)
LDL CALC: 81 mg/dL
LDl/HDL Ratio: 2.3
Triglycerides: 111 mg/dL (ref 40–160)

## 2016-08-27 LAB — CK ISOENZYMES
CK-BB: 0 %
CK-MB: 0 % (ref 0–3)
CK-MM: 100 % (ref 97–100)
Macro Type 1: 0 %
Macro Type 2: 0 %
Total CK: 47 U/L (ref 24–204)

## 2016-08-27 LAB — BASIC METABOLIC PANEL
BUN: 14 mg/dL (ref 4–21)
CREATININE: 0.8 mg/dL (ref 0.6–1.3)
GLUCOSE: 88 mg/dL
Potassium: 4.9 mmol/L (ref 3.4–5.3)
Sodium: 141 mmol/L (ref 137–147)

## 2016-08-27 LAB — VITAMIN D 25 HYDROXY (VIT D DEFICIENCY, FRACTURES): Vit D, 25-Hydroxy: 34.7 ng/mL (ref 30.0–100.0)

## 2016-08-27 LAB — TSH: TSH: 2.41 u[IU]/mL (ref 0.41–5.90)

## 2016-08-27 LAB — PSA: PSA: 1

## 2016-09-02 ENCOUNTER — Encounter: Payer: Self-pay | Admitting: Internal Medicine

## 2016-09-11 DIAGNOSIS — H6123 Impacted cerumen, bilateral: Secondary | ICD-10-CM | POA: Diagnosis not present

## 2016-09-11 DIAGNOSIS — J309 Allergic rhinitis, unspecified: Secondary | ICD-10-CM | POA: Diagnosis not present

## 2016-10-03 ENCOUNTER — Telehealth: Payer: Self-pay | Admitting: Internal Medicine

## 2016-10-03 NOTE — Telephone Encounter (Signed)
Pt called and stated that he has new insurance and they will not cover rosuvastatin (CRESTOR) 5 MG tablet, pt called insurance to find out what is covered. The ones that are: atorvastatin, lovastatin, pravastatin sodium, and simvistatin tabs. Please advise, pt has enough medication till the end of the year. 2018 is when the formulary changes.   Call pt @ [phone]

## 2016-10-05 MED ORDER — ATORVASTATIN CALCIUM 20 MG PO TABS: 20.0000 mg | ORAL_TABLET | Freq: Every day | ORAL | 3 refills | Status: DC

## 2016-10-09 NOTE — Telephone Encounter (Signed)
Noted, thanks!

## 2016-10-09 NOTE — Telephone Encounter (Signed)
Pt called back and said to disregard message, he will be switching insurances.

## 2016-12-08 ENCOUNTER — Telehealth: Payer: Self-pay | Admitting: *Deleted

## 2016-12-08 ENCOUNTER — Other Ambulatory Visit: Payer: Self-pay

## 2016-12-08 MED ORDER — ROSUVASTATIN CALCIUM 5 MG PO TABS: 5.0000 mg | ORAL_TABLET | Freq: Every day | ORAL | 2 refills | Status: DC

## 2016-12-08 NOTE — Telephone Encounter (Signed)
Patient has requested a medication refill for Crestor  [facility] has requested to [facility], due to ins

## 2016-12-08 NOTE — Telephone Encounter (Signed)
Patient was on Crestor in the past. Was switched to Lipitor in November 2017.  Spoke to patient he insisted the he is on Crestor and was not on Lipitor. Refilled Crestor per patient request and sent to pharmacy.

## 2016-12-08 NOTE — Telephone Encounter (Signed)
Please call pt at [phone].

## 2016-12-08 NOTE — Telephone Encounter (Signed)
Left message to call looks like patient is on Lipitor

## 2016-12-08 NOTE — Telephone Encounter (Signed)
Sent crestor to pharmacy to [facility]

## 2016-12-09 ENCOUNTER — Other Ambulatory Visit: Payer: Self-pay

## 2016-12-09 MED ORDER — ATORVASTATIN CALCIUM 20 MG PO TABS: 20.0000 mg | ORAL_TABLET | Freq: Every day | ORAL | 3 refills | Status: DC

## 2016-12-09 NOTE — Telephone Encounter (Signed)
Patient was correct; medication was going to be changed to atorvastatin with next refill, (see medication history ), if he has not picked it up,  Call the pharmacy and tell them to pull back the crestor and filll the atorvastatin (less $$$) that I resent.

## 2016-12-10 NOTE — Telephone Encounter (Signed)
Left message advising for patient script sent in. Spoke with Wells Guiles at [facility] they never received script for Crestor.

## 2016-12-11 ENCOUNTER — Encounter: Payer: Self-pay | Admitting: Urology

## 2016-12-11 ENCOUNTER — Ambulatory Visit (INDEPENDENT_AMBULATORY_CARE_PROVIDER_SITE_OTHER): Payer: Medicare Other | Admitting: Urology

## 2016-12-11 VITALS — BP 137/92 | HR 79 | Ht 69.0 in | Wt 149.6 lb

## 2016-12-11 DIAGNOSIS — N401 Enlarged prostate with lower urinary tract symptoms: Secondary | ICD-10-CM | POA: Diagnosis not present

## 2016-12-11 DIAGNOSIS — Z87898 Personal history of other specified conditions: Secondary | ICD-10-CM | POA: Diagnosis not present

## 2016-12-11 DIAGNOSIS — N138 Other obstructive and reflux uropathy: Secondary | ICD-10-CM

## 2016-12-11 MED ORDER — FINASTERIDE 5 MG PO TABS: 5.0000 mg | ORAL_TABLET | Freq: Every day | ORAL | 3 refills | Status: DC

## 2016-12-11 NOTE — Progress Notes (Signed)
12/11/2016 10:18 AM   Johnny Munoz 1951-03-17 [id number]  Referring provider: [name], MD [address] [place], [place] [address]  Chief Complaint  Patient presents with  . Benign Prostatic Hypertrophy    1 year follow up   . Elevated PSA    HPI: Patient is a 66 year old Caucasian male with a history of elevated PSA and BPH with LUTS who would like to establish care with a local urologist.  History of elevated PSA Patient states about 4 years ago he underwent a prostate biopsy for a PSA level that  4.1  with Dr. [name] at the [facility] urology office. He states the biopsy results were benign.  His most recent PSA is 1.0 on 08/27/2016.  BPH WITH LUTS His IPSS score today is 10, which is moderate lower urinary tract symptomatology. He is mostly satisfied with his quality life due to his urinary symptoms. His major complaints are frequency and nocturia.  He denies any dysuria, hematuria or suprapubic pain.  His previous I PSS score was 8/1.  He currently taking finasteride 5 mg daily.  He underwent a TUMT with Dr. [name] in 2008.  He also denies any recent fevers, chills, nausea or vomiting.  He has a family history of PCa, with a maternal uncle with prostate cancer.        IPSS    Row Name 12/11/16 1000         International Prostate Symptom Score   How often have you had the sensation of not emptying your bladder? Less than half the time     How often have you had to urinate less than every two hours? Less than half the time     How often have you found you stopped and started again several times when you urinated? Less than 1 in 5 times     How often have you found it difficult to postpone urination? Less than 1 in 5 times     How often have you had a weak urinary stream? Less than 1 in 5 times     How often have you had to strain to start urination? Less than 1 in 5 times     How many times did you typically get up at night to urinate? 2  Times     Total IPSS Score 10       Quality of Life due to urinary symptoms   If you were to spend the rest of your life with your urinary condition just the way it is now how would you feel about that? Mostly Satisfied        Score:  1-7 Mild 8-19 Moderate 20-35 Severe     PMH: Past Medical History:  Diagnosis Date  . Allergy   . Arthritis   . BPH (benign prostatic hyperplasia)   . Hyperlipidemia     Surgical History: Past Surgical History:  Procedure Laterality Date  . COLONOSCOPY  2003   Dr. [name]  . Trans Urethral Microwave Therapy      Home Medications:  Allergies as of 12/11/2016   No Known Allergies     Medication List       Accurate as of 12/11/16 10:18 AM. Always use your most recent med list.          atorvastatin 20 MG tablet Commonly known as:  LIPITOR Take 1 tablet (20 mg total) by mouth daily.   finasteride 5 MG tablet Commonly known as:  PROSCAR  Take 1 tablet (5 mg total) by mouth daily.   fluticasone 27.5 MCG/SPRAY nasal spray Commonly known as:  VERAMYST Place into the nose daily.   ibuprofen 200 MG tablet Commonly known as:  ADVIL,MOTRIN Take 200 mg by mouth every 6 (six) hours as needed.   OSTEO BI-FLEX JOINT SHIELD PO Take by mouth.   QNASL 80 MCG/ACT Aers Generic drug:  Beclomethasone Dipropionate 2 puffs into nostril daily       Allergies: No Known Allergies  Family History: Family History  Problem Relation Age of Onset  . Diabetes Father   . Heart disease Father     CABG after 19  . Prostate cancer Maternal Uncle   . Kidney cancer Neg Hx   . Bladder Cancer Neg Hx     Social History:  reports that he has never smoked. He has never used smokeless tobacco. He reports that he drinks about 1.5 oz of alcohol per week . He reports that he does not use drugs.  ROS: UROLOGY Frequent Urination?: Yes Hard to postpone urination?: No Burning/pain with urination?: No Get up at night to urinate?: Yes Leakage of urine?:  No Urine stream starts and stops?: No Trouble starting stream?: No Do you have to strain to urinate?: No Blood in urine?: No Urinary tract infection?: No Sexually transmitted disease?: No Injury to kidneys or bladder?: No Painful intercourse?: No Weak stream?: No Erection problems?: No Penile pain?: No  Gastrointestinal Nausea?: No Vomiting?: No Indigestion/heartburn?: No Diarrhea?: No Constipation?: No  Constitutional Fever: No Night sweats?: No Weight loss?: No Fatigue?: No  Skin Skin rash/lesions?: No Itching?: No  Eyes Blurred vision?: No Double vision?: No  Ears/Nose/Throat Sore throat?: No Sinus problems?: Yes  Hematologic/Lymphatic Swollen glands?: No Easy bruising?: No  Cardiovascular Leg swelling?: No Chest pain?: No  Respiratory Cough?: No Shortness of breath?: No  Endocrine Excessive thirst?: No  Musculoskeletal Back pain?: No Joint pain?: Yes  Neurological Headaches?: No Dizziness?: No  Psychologic Depression?: No Anxiety?: No  Physical Exam: BP (!) 137/92   Pulse 79   Ht 5\' 9"  (1.753 m)   Wt 149 lb 9.6 oz (67.9 kg)   BMI 22.09 kg/m   Constitutional: Well nourished. Alert and oriented, No acute distress. HEENT: [place] AT, moist mucus membranes. Trachea midline, no masses. Cardiovascular: No clubbing, cyanosis, or edema. Respiratory: Normal respiratory effort, no increased work of breathing. GI: Abdomen is soft, non tender, non distended, no abdominal masses. Liver and spleen not palpable.  No hernias appreciated.  Stool sample for occult testing is not indicated.   GU: No CVA tenderness.  No bladder fullness or masses.  Patient with circumcised phallus.   Urethral meatus is patent.  No penile discharge. No penile lesions or rashes. Scrotum without lesions, cysts, rashes and/or edema.  Testicles are located scrotally bilaterally. No masses are appreciated in the testicles. Left and right epididymis are normal. Rectal: Patient with   normal sphincter tone. Anus and perineum without scarring or rashes. No rectal masses are appreciated. Prostate is approximately 50 grams, no nodules are appreciated. Seminal vesicles are normal. Skin: No rashes, bruises or suspicious lesions. Lymph: No cervical or inguinal adenopathy. Neurologic: Grossly intact, no focal deficits, moving all 4 extremities. Psychiatric: Normal mood and affect.  Laboratory Data:  Lab Results  Component Value Date   CREATININE 0.8 08/27/2016    Lab Results  Component Value Date   PSA 1.0 08/27/2016   PSA 0.75 03/06/2016   PSA 2.11 06/28/2013  PSA  1.2   08/2015 PSA    1.0 08/2016  Lab Results  Component Value Date   TSH 2.41 08/27/2016     Assessment & Plan:    1. History of elevated PSA:   Patient underwent a biopsy of his prostate in November 2012 for a PSA of 4.1.  His biopsy was benign. His most recent PSA was 1.0 in September 2017. We will continue to monitor. He will return in 1 year for I PSS score, exam and PSA.   2. BPH with LUTS  - IPSS score is 11/2, it is worsening  - Continue conservative management, avoiding bladder irritants and timed voiding's  - Continue finasteride 5 mg daily; refill given  - Cannot tolerate medication or medication failure, schedule cystoscopy  - RTC in 12 months for IPSS, PSA and exam     Return in about 1 year (around 12/11/2017) for IPSS, PSA and exam.  These notes generated with voice recognition software. I apologize for typographical errors.  Zara Council, [facility] [address] [place], [place] [address] [phone]

## 2016-12-12 ENCOUNTER — Ambulatory Visit: Payer: Managed Care, Other (non HMO) | Admitting: Urology

## 2017-02-16 ENCOUNTER — Telehealth: Payer: Self-pay | Admitting: Radiology

## 2017-02-16 DIAGNOSIS — Z79899 Other long term (current) drug therapy: Secondary | ICD-10-CM

## 2017-02-16 DIAGNOSIS — E785 Hyperlipidemia, unspecified: Secondary | ICD-10-CM

## 2017-02-16 NOTE — Telephone Encounter (Signed)
Pt coming in for labs tomorrow, please place future orders. Thank you.  

## 2017-02-17 ENCOUNTER — Other Ambulatory Visit (INDEPENDENT_AMBULATORY_CARE_PROVIDER_SITE_OTHER): Payer: Medicare Other

## 2017-02-17 DIAGNOSIS — Z79899 Other long term (current) drug therapy: Secondary | ICD-10-CM | POA: Diagnosis not present

## 2017-02-17 DIAGNOSIS — E785 Hyperlipidemia, unspecified: Secondary | ICD-10-CM

## 2017-02-17 LAB — COMPREHENSIVE METABOLIC PANEL
ALBUMIN: 4.3 g/dL (ref 3.5–5.2)
ALK PHOS: 62 U/L (ref 39–117)
ALT: 23 U/L (ref 0–53)
AST: 16 U/L (ref 0–37)
BUN: 16 mg/dL (ref 6–23)
CHLORIDE: 104 meq/L (ref 96–112)
CO2: 29 mEq/L (ref 19–32)
Calcium: 9.4 mg/dL (ref 8.4–10.5)
Creatinine, Ser: 0.9 mg/dL (ref 0.40–1.50)
GFR: 89.9 mL/min (ref >60.00)
GLUCOSE: 102 mg/dL — AB (ref 70–99)
POTASSIUM: 4.3 meq/L (ref 3.5–5.1)
SODIUM: 139 meq/L (ref 135–145)
TOTAL PROTEIN: 6.3 g/dL (ref 6.0–8.3)
Total Bilirubin: 0.6 mg/dL (ref 0.2–1.2)

## 2017-02-17 LAB — LIPID PANEL
Cholesterol: 223 mg/dL — ABNORMAL HIGH (ref 0–200)
HDL: 64.8 mg/dL (ref >39.00)
LDL CALC: 138 mg/dL — AB (ref 0–99)
NONHDL: 158.63
Total CHOL/HDL Ratio: 3
Triglycerides: 102 mg/dL (ref 0.0–149.0)
VLDL: 20.4 mg/dL (ref 0.0–40.0)

## 2017-02-17 NOTE — Telephone Encounter (Signed)
Pt came in for labs this morning. Orders were not in yet. Please place "future" lab orders. Only basic tubes were collected.  Pt also wanted to let you know that he stopped the Lipitor for 30 days now. It was causing joint pain & stiffness. He plays the piano & organ.  His daughter (an Therapist, sports) wants him to try some vinegar drink & wants to know what you think.

## 2017-02-17 NOTE — Addendum Note (Signed)
Addended by: [name] on: 02/17/2017 08:20 AM   Modules accepted: Orders

## 2017-02-17 NOTE — Telephone Encounter (Signed)
Labs ordered.  Will respond via e mail re vinegar drink.

## 2017-02-18 ENCOUNTER — Encounter: Payer: Self-pay | Admitting: Internal Medicine

## 2017-03-12 DIAGNOSIS — H6123 Impacted cerumen, bilateral: Secondary | ICD-10-CM | POA: Diagnosis not present

## 2017-03-12 DIAGNOSIS — J309 Allergic rhinitis, unspecified: Secondary | ICD-10-CM | POA: Diagnosis not present

## 2017-04-06 ENCOUNTER — Ambulatory Visit (INDEPENDENT_AMBULATORY_CARE_PROVIDER_SITE_OTHER): Payer: Medicare Other

## 2017-04-06 ENCOUNTER — Ambulatory Visit (INDEPENDENT_AMBULATORY_CARE_PROVIDER_SITE_OTHER): Payer: Medicare Other | Admitting: Podiatry

## 2017-04-06 ENCOUNTER — Encounter: Payer: Self-pay | Admitting: Podiatry

## 2017-04-06 DIAGNOSIS — M79672 Pain in left foot: Secondary | ICD-10-CM

## 2017-04-06 DIAGNOSIS — M778 Other enthesopathies, not elsewhere classified: Secondary | ICD-10-CM

## 2017-04-06 DIAGNOSIS — M7752 Other enthesopathy of left foot: Secondary | ICD-10-CM

## 2017-04-06 DIAGNOSIS — M779 Enthesopathy, unspecified: Principal | ICD-10-CM

## 2017-04-06 NOTE — Progress Notes (Signed)
   Subjective:    Patient ID: Johnny Munoz, male    DOB: 1951/05/29, 66 y.o.   MRN: [id number]  HPI: He presents today with a chief complaint of plantar pain to the plantar aspect of the forefoot left. He states it feels like walking on a rope. He says Aleve seeing stick out but does not saw problem. He states this is been going on now for the past few months and seems to be worsening.    Review of Systems  Allergic/Immunologic: Positive for food allergies.  All other systems reviewed and are negative.      Objective:   Physical Exam: Vital signs are stable alert and oriented 3. Pulses are palpable. Neurologic sensorium is intact to 2 reflexes are intact muscle strength is bilateral symmetrical and intact. Orthopedic evaluation demonstrates pain on palpation third metatarsophalangeal joint of the left foot. No Mulder's click no pain on palpation of the interdigital space. Pain on end range of motion of the third toes indicative of plantar fasciitis radial grafts taken today do not demonstrate any type of osseus abnormalities in this area. Cutaneous evaluation does not demonstrate any open lesions or wounds.      Assessment & Plan:  Assessment: Capsulitis of the third metatarsophalangeal joint left foot.  Plan: Injected periarticular today with Kenalog and local anesthetic discussed appropriate shoe gear. Follow up with him as needed.

## 2017-06-12 DIAGNOSIS — C4441 Basal cell carcinoma of skin of scalp and neck: Secondary | ICD-10-CM | POA: Diagnosis not present

## 2017-06-12 DIAGNOSIS — D485 Neoplasm of uncertain behavior of skin: Secondary | ICD-10-CM | POA: Diagnosis not present

## 2017-06-12 DIAGNOSIS — D2271 Melanocytic nevi of right lower limb, including hip: Secondary | ICD-10-CM | POA: Diagnosis not present

## 2017-06-12 DIAGNOSIS — D2262 Melanocytic nevi of left upper limb, including shoulder: Secondary | ICD-10-CM | POA: Diagnosis not present

## 2017-06-12 DIAGNOSIS — D225 Melanocytic nevi of trunk: Secondary | ICD-10-CM | POA: Diagnosis not present

## 2017-06-12 DIAGNOSIS — L821 Other seborrheic keratosis: Secondary | ICD-10-CM | POA: Diagnosis not present

## 2017-07-28 DIAGNOSIS — L728 Other follicular cysts of the skin and subcutaneous tissue: Secondary | ICD-10-CM | POA: Diagnosis not present

## 2017-08-21 ENCOUNTER — Encounter: Payer: Self-pay | Admitting: Internal Medicine

## 2017-08-23 ENCOUNTER — Telehealth: Payer: Self-pay | Admitting: Internal Medicine

## 2017-08-23 NOTE — Telephone Encounter (Signed)
APPT MOVED from Monday to Tuesday 10:30 per phone conversation

## 2017-08-24 ENCOUNTER — Encounter: Payer: Self-pay | Admitting: Internal Medicine

## 2017-08-25 ENCOUNTER — Encounter: Payer: Self-pay | Admitting: Internal Medicine

## 2017-08-25 ENCOUNTER — Ambulatory Visit (INDEPENDENT_AMBULATORY_CARE_PROVIDER_SITE_OTHER): Payer: Medicare Other | Admitting: Internal Medicine

## 2017-08-25 VITALS — BP 128/86 | HR 53 | Temp 98.3°F | Resp 14 | Ht 69.0 in | Wt 148.2 lb

## 2017-08-25 DIAGNOSIS — Z0000 Encounter for general adult medical examination without abnormal findings: Secondary | ICD-10-CM

## 2017-08-25 DIAGNOSIS — L723 Sebaceous cyst: Secondary | ICD-10-CM

## 2017-08-25 DIAGNOSIS — Z23 Encounter for immunization: Secondary | ICD-10-CM | POA: Diagnosis not present

## 2017-08-25 DIAGNOSIS — E7800 Pure hypercholesterolemia, unspecified: Secondary | ICD-10-CM

## 2017-08-25 DIAGNOSIS — L089 Local infection of the skin and subcutaneous tissue, unspecified: Secondary | ICD-10-CM

## 2017-08-25 DIAGNOSIS — R252 Cramp and spasm: Secondary | ICD-10-CM | POA: Diagnosis not present

## 2017-08-25 LAB — CBC WITH DIFFERENTIAL/PLATELET
Basophils Absolute: 0.1 10*3/uL (ref 0.0–0.1)
Basophils Relative: 1.1 % (ref 0.0–3.0)
EOS PCT: 6.3 % — AB (ref 0.0–5.0)
Eosinophils Absolute: 0.4 10*3/uL (ref 0.0–0.7)
HEMATOCRIT: 48.6 % (ref 39.0–52.0)
Hemoglobin: 16.1 g/dL (ref 13.0–17.0)
LYMPHS ABS: 1.8 10*3/uL (ref 0.7–4.0)
Lymphocytes Relative: 32.6 % (ref 12.0–46.0)
MCHC: 33.1 g/dL (ref 30.0–36.0)
MCV: 94.3 fl (ref 78.0–100.0)
MONO ABS: 0.4 10*3/uL (ref 0.1–1.0)
MONOS PCT: 6.6 % (ref 3.0–12.0)
NEUTROS ABS: 3 10*3/uL (ref 1.4–7.7)
Neutrophils Relative %: 53.4 % (ref 43.0–77.0)
Platelets: 277 10*3/uL (ref 150.0–400.0)
RBC: 5.15 Mil/uL (ref 4.22–5.81)
RDW: 14.1 % (ref 11.5–15.5)
WBC: 5.6 10*3/uL (ref 4.0–10.5)

## 2017-08-25 LAB — LIPID PANEL
CHOL/HDL RATIO: 3
Cholesterol: 244 mg/dL — ABNORMAL HIGH (ref 0–200)
HDL: 72.4 mg/dL (ref >39.00)
LDL Cholesterol: 144 mg/dL — ABNORMAL HIGH (ref 0–99)
NONHDL: 171.99
Triglycerides: 140 mg/dL (ref 0.0–149.0)
VLDL: 28 mg/dL (ref 0.0–40.0)

## 2017-08-25 LAB — MAGNESIUM: Magnesium: 2.2 mg/dL (ref 1.5–2.5)

## 2017-08-25 LAB — COMPREHENSIVE METABOLIC PANEL
ALT: 15 U/L (ref 0–53)
AST: 15 U/L (ref 0–37)
Albumin: 4.4 g/dL (ref 3.5–5.2)
Alkaline Phosphatase: 61 U/L (ref 39–117)
BILIRUBIN TOTAL: 0.5 mg/dL (ref 0.2–1.2)
BUN: 18 mg/dL (ref 6–23)
CO2: 32 meq/L (ref 19–32)
Calcium: 9.5 mg/dL (ref 8.4–10.5)
Chloride: 102 mEq/L (ref 96–112)
Creatinine, Ser: 0.89 mg/dL (ref 0.40–1.50)
GFR: 90.92 mL/min (ref >60.00)
GLUCOSE: 103 mg/dL — AB (ref 70–99)
POTASSIUM: 4.8 meq/L (ref 3.5–5.1)
SODIUM: 138 meq/L (ref 135–145)
TOTAL PROTEIN: 6.7 g/dL (ref 6.0–8.3)

## 2017-08-25 MED ORDER — CEPHALEXIN 500 MG PO CAPS: 500.0000 mg | ORAL_CAPSULE | Freq: Four times a day (QID) | ORAL | 0 refills | Status: DC

## 2017-08-25 NOTE — Patient Instructions (Signed)
You have an infected /enflamed sebaceous cyst overlying your thoracic spine  I am prescribing cephalexin, an antibiotic to take 4 tiems daily ofr 7 days,  And referring you to Dr Erle Crocker to have it incised and drained.  Please take a probiotic ( Align, Floraque or Culturelle), the generic version of one of these over the counter medications, or an alternative form (kombucha,  Kevita,  Yogurt, or another dietary source) for a minimum of 3 weeks to prevent a serious antibiotic associated diarrhea  Called clostridium dificile colitis.   You received the influena and the Prevanr vaccines today  Please drop off a  copy of your healthcare POA and Living wills at your convenience     Health Maintenance, Male A healthy lifestyle and preventive care is important for your health and wellness. Ask your health care provider about what schedule of regular examinations is right for you. What should I know about weight and diet? Eat a Healthy Diet  Eat plenty of vegetables, fruits, whole grains, low-fat dairy products, and lean protein.  Do not eat a lot of foods high in solid fats, added sugars, or salt.  Maintain a Healthy Weight Regular exercise can help you achieve or maintain a healthy weight. You should:  Do at least 150 minutes of exercise each week. The exercise should increase your heart rate and make you sweat (moderate-intensity exercise).  Do strength-training exercises at least twice a week.  Watch Your Levels of Cholesterol and Blood Lipids  Have your blood tested for lipids and cholesterol every 5 years starting at 66 years of age. If you are at high risk for heart disease, you should start having your blood tested when you are 66 years old. You may need to have your cholesterol levels checked more often if: ? Your lipid or cholesterol levels are high. ? You are older than 66 years of age. ? You are at high risk for heart disease.  What should I know about cancer screening? Many  types of cancers can be detected early and may often be prevented. Lung Cancer  You should be screened every year for lung cancer if: ? You are a current smoker who has smoked for at least 30 years. ? You are a former smoker who has quit within the past 15 years.  Talk to your health care provider about your screening options, when you should start screening, and how often you should be screened.  Colorectal Cancer  Routine colorectal cancer screening usually begins at 65 years of age and should be repeated every 5-10 years until you are 66 years old. You may need to be screened more often if early forms of precancerous polyps or small growths are found. Your health care provider may recommend screening at an earlier age if you have risk factors for colon cancer.  Your health care provider may recommend using home test kits to check for hidden blood in the stool.  A small camera at the end of a tube can be used to examine your colon (sigmoidoscopy or colonoscopy). This checks for the earliest forms of colorectal cancer.  Prostate and Testicular Cancer  Depending on your age and overall health, your health care provider may do certain tests to screen for prostate and testicular cancer.  Talk to your health care provider about any symptoms or concerns you have about testicular or prostate cancer.  Skin Cancer  Check your skin from head to toe regularly.  Tell your health care provider about any  new moles or changes in moles, especially if: ? There is a change in a mole's size, shape, or color. ? You have a mole that is larger than a pencil eraser.  Always use sunscreen. Apply sunscreen liberally and repeat throughout the day.  Protect yourself by wearing long sleeves, pants, a wide-brimmed hat, and sunglasses when outside.  What should I know about heart disease, diabetes, and high blood pressure?  If you are 62-79 years of age, have your blood pressure checked every 3-5 years. If  you are 54 years of age or older, have your blood pressure checked every year. You should have your blood pressure measured twice-once when you are at a hospital or clinic, and once when you are not at a hospital or clinic. Record the average of the two measurements. To check your blood pressure when you are not at a hospital or clinic, you can use: ? An automated blood pressure machine at a pharmacy. ? A home blood pressure monitor.  Talk to your health care provider about your target blood pressure.  If you are between 25-15 years old, ask your health care provider if you should take aspirin to prevent heart disease.  Have regular diabetes screenings by checking your fasting blood sugar level. ? If you are at a normal weight and have a low risk for diabetes, have this test once every three years after the age of 27. ? If you are overweight and have a high risk for diabetes, consider being tested at a younger age or more often.  A one-time screening for abdominal aortic aneurysm (AAA) by ultrasound is recommended for men aged 73-75 years who are current or former smokers. What should I know about preventing infection? Hepatitis B If you have a higher risk for hepatitis B, you should be screened for this virus. Talk with your health care provider to find out if you are at risk for hepatitis B infection. Hepatitis C Blood testing is recommended for:  Everyone born from [birth year] through 1965.  Anyone with known risk factors for hepatitis C.  Sexually Transmitted Diseases (STDs)  You should be screened each year for STDs including gonorrhea and chlamydia if: ? You are sexually active and are younger than 66 years of age. ? You are older than 66 years of age and your health care provider tells you that you are at risk for this type of infection. ? Your sexual activity has changed since you were last screened and you are at an increased risk for chlamydia or gonorrhea. Ask your health care  provider if you are at risk.  Talk with your health care provider about whether you are at high risk of being infected with HIV. Your health care provider may recommend a prescription medicine to help prevent HIV infection.  What else can I do?  Schedule regular health, dental, and eye exams.  Stay current with your vaccines (immunizations).  Do not use any tobacco products, such as cigarettes, chewing tobacco, and e-cigarettes. If you need help quitting, ask your health care provider.  Limit alcohol intake to no more than 2 drinks per day. One drink equals 12 ounces of beer, 5 ounces of wine, or 1 ounces of hard liquor.  Do not use street drugs.  Do not share needles.  Ask your health care provider for help if you need support or information about quitting drugs.  Tell your health care provider if you often feel depressed.  Tell your health care provider if  you have ever been abused or do not feel safe at home. This information is not intended to replace advice given to you by your health care provider. Make sure you discuss any questions you have with your health care provider. Document Released: 05/15/2008 Document Revised: 07/16/2016 Document Reviewed: 08/21/2015 Elsevier Interactive Patient Education  [employer].

## 2017-08-25 NOTE — Progress Notes (Signed)
Patient ID: Johnny Munoz, male    DOB: [birth year]  Age: 66 y.o. MRN: [id number]  The patient is here for his welcome to  Medicare wellness examination and management of other chronic and acute problems.    Tetanus due 2019 Colonoscopy 2014 Due for prevnar  Due for PSA next week (Medicare PSA) Has not had chicken pox per varicella titer 2015  Had a 10 day [place] cruise recently.  Plans to do a few more based on favorable experience.     The risk factors are reflected in the social history.  The roster of all physicians providing medical care to patient - is listed in the Snapshot section of the chart.  Activities of daily living:  The patient is 100% independent in all ADLs: dressing, toileting, feeding as well as independent mobility  Home safety : The patient has smoke detectors in the home. They wear seatbelts.  There are no firearms at home. There is no violence in the home.   There is no risks for hepatitis, STDs or HIV. There is no   history of blood transfusion. They have no travel history to infectious disease endemic areas of the world.  The patient has seen their dentist in the last six month. They have seen their eye doctor in the last year. They admit to slight hearing difficulty with regard to whispered voices and some television programs.  They have deferred audiologic testing in the last year.  They do not  have excessive sun exposure. Discussed the need for sun protection: hats, long sleeves and use of sunscreen if there is significant sun exposure.   Diet: the importance of a healthy diet is discussed. They do have a healthy diet.  The benefits of regular aerobic exercise were discussed. he walks 4 times per week ,  20 minutes.   Depression screen: there are no signs or vegative symptoms of depression- irritability, change in appetite, anhedonia, sadness/tearfullness.  Cognitive assessment: the patient manages all their financial and personal affairs and is  actively engaged. They could relate day,date,year and events; recalled 3/3 objects at 3 minutes; performed clock-face test normally.  The following portions of the patient's history were reviewed and updated as appropriate: allergies, current medications, past family history, past medical history,  past surgical history, past social history  and problem list.  Visual acuity was not assessed per patient preference since she has regular follow up with her ophthalmologist. Hearing and body mass index were assessed and reviewed.   During the course of the visit the patient was educated and counseled about appropriate screening and preventive services including : fall prevention , diabetes screening, nutrition counseling, colorectal cancer screening, and recommended immunizations.   During the course of the visit , End of Life objectives were discussed at length,  Patient has  a living will in place,  Has donated body to [employer].  And has designated his wife  as healthcare power of attorney.     CC: The primary encounter diagnosis was Infected sebaceous cyst. Diagnoses of Encounter for immunization, Muscle cramps at night, Pure hypercholesterolemia, Need for vaccination with 13-polyvalent pneumococcal conjugate vaccine, and Welcome to Medicare preventive visit were also pertinent to this visit.  Large enflamed sebaceous cyst on upper back treated by dermatology in August with 10 days of doxycycline with no change. The cyst has become tender,  Has not been draining.   finished the abx on sept 8th,  Cyst is located over the thoracic spine.  Getting BCC removed via Moh's procedure on Oct 15th  From right parietal scalp   Fasting labs today  Using 2 tbsps of  A tonic composed of unfiltered organic vinegar, olive  oil, and pureed vegetable drink (recipe obtained from an on line health website).   Leg cramps gone,  tolerated crestor but not lipitor.      History [name] has a past medical history  of Allergy; Arthritis; BPH (benign prostatic hyperplasia); and Hyperlipidemia.   He has a past surgical history that includes Trans Urethral Microwave Therapy and Colonoscopy (2003).   His family history includes Diabetes in his father; Heart disease in his father; Prostate cancer in his maternal uncle.He reports that he has never smoked. He has never used smokeless tobacco. He reports that he drinks about 1.5 oz of alcohol per week . He reports that he does not use drugs.  Outpatient Medications Prior to Visit  Medication Sig Dispense Refill  . finasteride (PROSCAR) 5 MG tablet Take 1 tablet (5 mg total) by mouth daily. 90 tablet 3  . fluticasone (FLONASE) 50 MCG/ACT nasal spray     . ibuprofen (ADVIL,MOTRIN) 200 MG tablet Take 200 mg by mouth every 6 (six) hours as needed.    . fluticasone (VERAMYST) 27.5 MCG/SPRAY nasal spray Place into the nose daily.     No facility-administered medications prior to visit.     Review of Systems   Patient denies headache, fevers, malaise, unintentional weight loss, skin rash, eye pain, sinus congestion and sinus pain, sore throat, dysphagia,  hemoptysis , cough, dyspnea, wheezing, chest pain, palpitations, orthopnea, edema, abdominal pain, nausea, melena, diarrhea, constipation, flank pain, dysuria, hematuria, urinary  Frequency, nocturia, numbness, tingling, seizures,  Focal weakness, Loss of consciousness,  Tremor, insomnia, depression, anxiety, and suicidal ideation.      Objective:  BP 128/86 (BP Location: Left Arm, Patient Position: Sitting, Cuff Size: Normal)   Pulse (!) 53   Temp 98.3 F (36.8 C) (Oral)   Resp 14   Ht 5\' 9"  (1.753 m)   Wt 148 lb 3.2 oz (67.2 kg)   SpO2 97%   BMI 21.89 kg/m   Physical Exam   General appearance: alert, cooperative and appears stated age Ears: normal TM's and external ear canals both ears Throat: lips, mucosa, and tongue normal; teeth and gums normal Neck: no adenopathy, no carotid bruit, supple,  symmetrical, trachea midline and thyroid not enlarged, symmetric, no tenderness/mass/nodules Back: symmetric, no curvature. ROM normal.1.5 cm sebaceous cyst mid thoracic spine . Lungs: clear to auscultation bilaterally Heart: regular rate and rhythm, S1, S2 normal, no murmur, click, rub or gallop Abdomen: soft, non-tender; bowel sounds normal; no masses,  no organomegaly Pulses: 2+ and symmetric Skin: Skin color, texture, turgor normal. No rashes or lesions Lymph nodes: Cervical, supraclavicular, and axillary nodes normal.    Assessment & Plan:   Problem List Items Addressed This Visit    Hyperlipidemia, unspecified    He has suspended statin therapy in favor of a vinegar tonic.   Fasting lipids done today indicate that his 10 yr  Risk of CAD events is 16%.  Will recommend resuming statin therapy   Lab Results  Component Value Date   CHOL 244 (H) 08/25/2017   CHOL 223 (H) 02/17/2017   CHOL 183 08/27/2016   Lab Results  Component Value Date   HDL 72.40 08/25/2017   HDL 64.80 02/17/2017   HDL 80 (A) 08/27/2016   Lab Results  Component Value Date   LDLCALC  144 (H) 08/25/2017   LDLCALC 138 (H) 02/17/2017   LDLCALC 81 08/27/2016   Lab Results  Component Value Date   TRIG 140.0 08/25/2017   TRIG 102.0 02/17/2017   TRIG 111 08/27/2016   Lab Results  Component Value Date   CHOLHDL 3 08/25/2017   CHOLHDL 3 02/17/2017   CHOLHDL 2.3 08/22/2016   Lab Results  Component Value Date   LDLDIRECT 120.8 12/29/2013   LDLDIRECT 110.5 06/28/2013   LDLDIRECT 107.8 06/14/2012        Relevant Orders   Lipid panel (Completed)   Infected sebaceous cyst - Primary    Progressively enlarging despite recent treatment with doxycycline by Dermatology .  Keflex prescribed, refer to Gen Surg for I & D       Relevant Medications   cephALEXin (KEFLEX) 500 MG capsule   Other Relevant Orders   Ambulatory referral to General Surgery   CBC with Differential/Platelet (Completed)   RESOLVED:  Muscle cramps at night   Relevant Orders   Comprehensive metabolic panel (Completed)   Magnesium (Completed)   Welcome to Medicare preventive visit    Welcome to  Medicare preventive visit was done as well as a comprehensive physical exam and management of acute and chronic conditions .  During the course of the visit the patient was educated and counseled about appropriate screening and preventive services including : fall prevention , diabetes screening, nutrition counseling, colorectal cancer screening, and recommended immunizations.  Printed recommendations for health maintenance screenings was given.        Other Visit Diagnoses    Encounter for immunization       Relevant Orders   Flu vaccine HIGH DOSE PF (Completed)   Need for vaccination with 13-polyvalent pneumococcal conjugate vaccine       Relevant Orders   Pneumococcal conjugate vaccine 13-valent (Completed)      I am having Mr. Popson start on cephALEXin. I am also having him maintain his ibuprofen, finasteride, and fluticasone.  Meds ordered this encounter  Medications  . cephALEXin (KEFLEX) 500 MG capsule    Sig: Take 1 capsule (500 mg total) by mouth 4 (four) times daily.    Dispense:  28 capsule    Refill:  0    Medications Discontinued During This Encounter  Medication Reason  . fluticasone (VERAMYST) 27.5 MCG/SPRAY nasal spray Patient has not taken in last 30 days    Follow-up: No Follow-up on file.   [name], MD

## 2017-08-26 ENCOUNTER — Ambulatory Visit (INDEPENDENT_AMBULATORY_CARE_PROVIDER_SITE_OTHER): Payer: Medicare Other | Admitting: General Surgery

## 2017-08-26 ENCOUNTER — Encounter: Payer: Self-pay | Admitting: General Surgery

## 2017-08-26 VITALS — BP 120/72 | HR 76 | Resp 12 | Ht 69.0 in | Wt 149.0 lb

## 2017-08-26 DIAGNOSIS — L089 Local infection of the skin and subcutaneous tissue, unspecified: Secondary | ICD-10-CM | POA: Insufficient documentation

## 2017-08-26 DIAGNOSIS — L723 Sebaceous cyst: Secondary | ICD-10-CM | POA: Diagnosis not present

## 2017-08-26 NOTE — Assessment & Plan Note (Signed)
Welcome to  Medicare preventive visit was done as well as a comprehensive physical exam and management of acute and chronic conditions .  During the course of the visit the patient was educated and counseled about appropriate screening and preventive services including : fall prevention , diabetes screening, nutrition counseling, colorectal cancer screening, and recommended immunizations.  Printed recommendations for health maintenance screenings was given.

## 2017-08-26 NOTE — Progress Notes (Signed)
Patient ID: Johnny Munoz, male   DOB: October 11, 1951, 66 y.o.   MRN: [id number]  Chief Complaint  Patient presents with  . Cyst    HPI Johnny Munoz is a 66 y.o. male.  Here today for evaluation of a cyst referred by Dr Derrel Nip. He has a large inflamed sebaceous cyst on upper back treated by dermatology in August with 10 days of doxycycline with no change. The cyst has become tender, it has started draining last night. He was started on Keflex yesterday by Dr. Derrel Nip.   HPI  Past Medical History:  Diagnosis Date  . Allergy   . Arthritis   . BPH (benign prostatic hyperplasia)   . Hyperlipidemia     Past Surgical History:  Procedure Laterality Date  . COLONOSCOPY  2003   Dr. [name]  . Trans Urethral Microwave Therapy      Family History  Problem Relation Age of Onset  . Diabetes Father   . Heart disease Father        CABG after 33  . Prostate cancer Maternal Uncle   . Kidney cancer Neg Hx   . Bladder Cancer Neg Hx     Social History Social History  Substance Use Topics  . Smoking status: Never Smoker  . Smokeless tobacco: Never Used  . Alcohol use 1.5 oz/week    3 drink(s) per week    No Known Allergies  Current Outpatient Prescriptions  Medication Sig Dispense Refill  . cephALEXin (KEFLEX) 500 MG capsule Take 1 capsule (500 mg total) by mouth 4 (four) times daily. 28 capsule 0  . finasteride (PROSCAR) 5 MG tablet Take 1 tablet (5 mg total) by mouth daily. 90 tablet 3  . fluticasone (FLONASE) 50 MCG/ACT nasal spray     . ibuprofen (ADVIL,MOTRIN) 200 MG tablet Take 200 mg by mouth every 6 (six) hours as needed.     No current facility-administered medications for this visit.     Review of Systems Review of Systems  Constitutional: Negative.   Respiratory: Negative.   Cardiovascular: Negative.     Blood pressure 120/72, pulse 76, resp. rate 12, height 5\' 9"  (1.753 m), weight 149 lb (67.6 kg).  Physical Exam Physical Exam  Constitutional: He  is oriented to person, place, and time. He appears well-developed and well-nourished.  Neurological: He is alert and oriented to person, place, and time.  Skin: Skin is warm and dry.     Psychiatric: He has a normal mood and affect. His behavior is normal.    Data Reviewed Notes reviewed   Assessment    Infected sebaceous cyst on back - drained in clinic today with patient's consent.   Procedure: incision and drainage of sebaceous cyst on back Using a TB syringe, 5mL of 1% Lidocaine HCl mixed with 0.5% sensorcaine was instilled into the area. An incision was made and 3-4 mL of pus was drained. Gauze was applied and patient tolerated the procedure well.  Plan    Counseled on wound care. He can continue to shower normally. Return in 6 weeks.     Patient   HPI, Physical Exam, Assessment and Plan have been scribed under the direction and in the presence of Mckinley Jewel, MD  Gaspar Cola, CMA  [facility] G 08/26/2017, 3:05 PM  I have completed the exam and reviewed the above documentation for accuracy and completeness.  I agree with the above.  Haematologist has been used and any errors in dictation or transcription are unintentional.  Mosi Hannold G. Jamal Collin, M.D., F.A.C.S.

## 2017-08-26 NOTE — Patient Instructions (Addendum)
He can continue to shower normally. Return in 6 weeks.

## 2017-08-26 NOTE — Assessment & Plan Note (Addendum)
He has suspended statin therapy in favor of a vinegar tonic.   Fasting lipids done today indicate that his 10 yr  Risk of CAD events is 16%.  Will recommend resuming statin therapy   Lab Results  Component Value Date   CHOL 244 (H) 08/25/2017   CHOL 223 (H) 02/17/2017   CHOL 183 08/27/2016   Lab Results  Component Value Date   HDL 72.40 08/25/2017   HDL 64.80 02/17/2017   HDL 80 (A) 08/27/2016   Lab Results  Component Value Date   LDLCALC 144 (H) 08/25/2017   LDLCALC 138 (H) 02/17/2017   LDLCALC 81 08/27/2016   Lab Results  Component Value Date   TRIG 140.0 08/25/2017   TRIG 102.0 02/17/2017   TRIG 111 08/27/2016   Lab Results  Component Value Date   CHOLHDL 3 08/25/2017   CHOLHDL 3 02/17/2017   CHOLHDL 2.3 08/22/2016   Lab Results  Component Value Date   LDLDIRECT 120.8 12/29/2013   LDLDIRECT 110.5 06/28/2013   LDLDIRECT 107.8 06/14/2012

## 2017-08-26 NOTE — Assessment & Plan Note (Signed)
Progressively enlarging despite recent treatment with doxycycline by Dermatology .  Keflex prescribed, refer to Gen Surg for I & D

## 2017-08-28 ENCOUNTER — Encounter: Payer: Self-pay | Admitting: Internal Medicine

## 2017-08-28 DIAGNOSIS — Z79899 Other long term (current) drug therapy: Secondary | ICD-10-CM

## 2017-08-30 ENCOUNTER — Other Ambulatory Visit: Payer: Self-pay | Admitting: Internal Medicine

## 2017-08-30 MED ORDER — ROSUVASTATIN CALCIUM 10 MG PO TABS: 10.0000 mg | ORAL_TABLET | Freq: Every day | ORAL | 5 refills | Status: DC

## 2017-08-31 NOTE — Telephone Encounter (Signed)
Palled to schedule labs. Need orders. Please advise, thank you!

## 2017-09-04 ENCOUNTER — Telehealth: Payer: Self-pay | Admitting: *Deleted

## 2017-09-04 NOTE — Telephone Encounter (Signed)
Spoke with pt and scheduled him a lab appt for 09/23/2017. Pt is aware of appt date and time.

## 2017-09-04 NOTE — Telephone Encounter (Signed)
I told him to have liver enzymes check 3-4 weeks after starting statin , and they are already ordered.

## 2017-09-04 NOTE — Telephone Encounter (Signed)
Pt questioned if Dr.Tullo would like to have his liver enzymes checked  Pt contact  [phone]

## 2017-09-04 NOTE — Telephone Encounter (Signed)
Please advise 

## 2017-09-14 DIAGNOSIS — L578 Other skin changes due to chronic exposure to nonionizing radiation: Secondary | ICD-10-CM | POA: Diagnosis not present

## 2017-09-14 DIAGNOSIS — L988 Other specified disorders of the skin and subcutaneous tissue: Secondary | ICD-10-CM | POA: Diagnosis not present

## 2017-09-14 DIAGNOSIS — L814 Other melanin hyperpigmentation: Secondary | ICD-10-CM | POA: Diagnosis not present

## 2017-09-14 DIAGNOSIS — C4441 Basal cell carcinoma of skin of scalp and neck: Secondary | ICD-10-CM | POA: Diagnosis not present

## 2017-09-21 DIAGNOSIS — H6123 Impacted cerumen, bilateral: Secondary | ICD-10-CM | POA: Diagnosis not present

## 2017-09-21 DIAGNOSIS — J309 Allergic rhinitis, unspecified: Secondary | ICD-10-CM | POA: Diagnosis not present

## 2017-09-23 ENCOUNTER — Other Ambulatory Visit (INDEPENDENT_AMBULATORY_CARE_PROVIDER_SITE_OTHER): Payer: Medicare Other

## 2017-09-23 DIAGNOSIS — Z79899 Other long term (current) drug therapy: Secondary | ICD-10-CM

## 2017-09-23 LAB — COMPREHENSIVE METABOLIC PANEL
ALBUMIN: 4.5 g/dL (ref 3.5–5.2)
ALK PHOS: 54 U/L (ref 39–117)
ALT: 29 U/L (ref 0–53)
AST: 21 U/L (ref 0–37)
BUN: 17 mg/dL (ref 6–23)
CALCIUM: 9.4 mg/dL (ref 8.4–10.5)
CO2: 29 mEq/L (ref 19–32)
Chloride: 102 mEq/L (ref 96–112)
Creatinine, Ser: 0.98 mg/dL (ref 0.40–1.50)
GFR: 81.34 mL/min (ref >60.00)
Glucose, Bld: 92 mg/dL (ref 70–99)
POTASSIUM: 4 meq/L (ref 3.5–5.1)
SODIUM: 139 meq/L (ref 135–145)
TOTAL PROTEIN: 7 g/dL (ref 6.0–8.3)
Total Bilirubin: 0.4 mg/dL (ref 0.2–1.2)

## 2017-09-25 ENCOUNTER — Encounter: Payer: Self-pay | Admitting: Internal Medicine

## 2017-10-05 ENCOUNTER — Ambulatory Visit: Payer: Medicare Other | Admitting: Podiatry

## 2017-10-07 ENCOUNTER — Encounter: Payer: Self-pay | Admitting: Podiatry

## 2017-10-07 ENCOUNTER — Encounter: Payer: Self-pay | Admitting: General Surgery

## 2017-10-07 ENCOUNTER — Ambulatory Visit (INDEPENDENT_AMBULATORY_CARE_PROVIDER_SITE_OTHER): Payer: Medicare Other | Admitting: General Surgery

## 2017-10-07 ENCOUNTER — Ambulatory Visit (INDEPENDENT_AMBULATORY_CARE_PROVIDER_SITE_OTHER): Payer: Medicare Other | Admitting: Podiatry

## 2017-10-07 VITALS — BP 120/68 | HR 67 | Resp 12 | Ht 69.0 in | Wt 149.0 lb

## 2017-10-07 DIAGNOSIS — M7752 Other enthesopathy of left foot: Secondary | ICD-10-CM | POA: Diagnosis not present

## 2017-10-07 DIAGNOSIS — G5760 Lesion of plantar nerve, unspecified lower limb: Secondary | ICD-10-CM

## 2017-10-07 DIAGNOSIS — L723 Sebaceous cyst: Secondary | ICD-10-CM | POA: Diagnosis not present

## 2017-10-07 DIAGNOSIS — G5780 Other specified mononeuropathies of unspecified lower limb: Secondary | ICD-10-CM

## 2017-10-07 DIAGNOSIS — M779 Enthesopathy, unspecified: Secondary | ICD-10-CM

## 2017-10-07 DIAGNOSIS — M778 Other enthesopathies, not elsewhere classified: Secondary | ICD-10-CM

## 2017-10-07 NOTE — Progress Notes (Signed)
He presents today with chief complaint of pain to the third metatarsophalangeal joint. He states that it feels a little different than it did before it feels a little more to the left than it does normally. He states that getting radiating pain to radiate up the fourth and third toes at this point. Denies any further trauma.  Objective: Vital signs are stable alert and oriented 3. Pulses are palpable. Neurologic sensorium is intact. Deep tendon reflexes are intact. He has pain on palpation to the third interdigital space of the left foot with a palpable Mulder's click.  Assessment: Neuroma third interdigital space left foot.  Plan: Injected the third space today with Kenalog and local anesthetic. Follow-up with him in 1 month necessary remember to ask him how his cruise went.

## 2017-10-07 NOTE — Progress Notes (Signed)
saPatient ID: Johnny Munoz, male   DOB: 07/08/1951, 66 y.o.   MRN: [id number]  Chief Complaint  Patient presents with  . Follow-up    HPI Johnny Munoz is a 66 y.o. male here today for his follow up infected back cyst. Patient states the area still drains from time to time but is not tender. Completed 10 day course of doxycycline.  HPI  Past Medical History:  Diagnosis Date  . Allergy   . Arthritis   . BPH (benign prostatic hyperplasia)   . Hyperlipidemia     Past Surgical History:  Procedure Laterality Date  . COLONOSCOPY  2003   Dr. [name]  . Trans Urethral Microwave Therapy      Family History  Problem Relation Age of Onset  . Diabetes Father   . Heart disease Father        CABG after 12  . Prostate cancer Maternal Uncle   . Kidney cancer Neg Hx   . Bladder Cancer Neg Hx     Social History Social History   Tobacco Use  . Smoking status: Never Smoker  . Smokeless tobacco: Never Used  Substance Use Topics  . Alcohol use: Yes    Alcohol/week: 1.5 oz    Types: 3 drink(s) per week  . Drug use: No    No Known Allergies  Current Outpatient Medications  Medication Sig Dispense Refill  . finasteride (PROSCAR) 5 MG tablet Take 1 tablet (5 mg total) by mouth daily. 90 tablet 3  . fluticasone (FLONASE) 50 MCG/ACT nasal spray     . ibuprofen (ADVIL,MOTRIN) 200 MG tablet Take 200 mg by mouth every 6 (six) hours as needed.    . rosuvastatin (CRESTOR) 10 MG tablet Take 1 tablet (10 mg total) by mouth daily. 30 tablet 5   No current facility-administered medications for this visit.     Review of Systems Review of Systems  Constitutional: Negative.   Respiratory: Negative.   Cardiovascular: Negative.     Blood pressure 120/68, pulse 67, resp. rate 12, height 5\' 9"  (1.753 m), weight 149 lb (67.6 kg).  Physical Exam Physical Exam  Constitutional: He is oriented to person, place, and time. He appears well-developed and well-nourished.    Pulmonary/Chest:      Neurological: He is alert and oriented to person, place, and time.  Skin: Skin is warm and dry.  Psychiatric: He has a normal mood and affect. His behavior is normal.    Data Reviewed Prior notes  Assessment    Sebaceous cyst, back, 1.5 cm. Completed course of doxycycline. Infection resolved, no signs of infection on exam. Plan for in-office excision. Discussed the benefits and risks with the patient and he elects to have the procedure done.     Plan     Patient to return for excision of back cyst. The patient is aware to call back for any questions or concerns.   HPI, Physical Exam, Assessment and Plan have been scribed under the direction and in the presence of Mckinley Jewel, MD  Gaspar Cola, CMA       I have completed the exam and reviewed the above documentation for accuracy and completeness.  I agree with the above.  Haematologist has been used and any errors in dictation or transcription are unintentional.  Damaya Channing G. Jamal Collin, M.D., F.A.C.S.  Junie Panning G 10/07/2017, 12:31 PM

## 2017-10-07 NOTE — Patient Instructions (Signed)
Patient to return for excision back cyst. The patient is aware to call back for any questions or concerns.

## 2017-10-19 ENCOUNTER — Encounter: Payer: Self-pay | Admitting: General Surgery

## 2017-10-19 ENCOUNTER — Ambulatory Visit: Payer: Medicare Other | Admitting: General Surgery

## 2017-10-19 VITALS — BP 110/68 | HR 69 | Resp 13 | Ht 69.0 in | Wt 147.0 lb

## 2017-10-19 DIAGNOSIS — L729 Follicular cyst of the skin and subcutaneous tissue, unspecified: Secondary | ICD-10-CM | POA: Diagnosis not present

## 2017-10-19 DIAGNOSIS — L720 Epidermal cyst: Secondary | ICD-10-CM | POA: Diagnosis not present

## 2017-10-19 DIAGNOSIS — L723 Sebaceous cyst: Secondary | ICD-10-CM

## 2017-10-19 NOTE — Patient Instructions (Addendum)
Patient to return in 2 weeks for suture removal.  You may shower. You can remove the waterproof dressing in 2 days. Dress with Neosporin and a Band-Aid.  Use ice pack to the area for comfort and to minimize bleeding and bruising.  We will call with your pathology report.

## 2017-10-19 NOTE — Progress Notes (Signed)
Patient ID: Johnny Munoz, male   DOB: 10/17/1951, 66 y.o.   MRN: [id number]  Chief Complaint  Patient presents with  . Procedure    HPI Johnny Munoz is a 66 y.o. male here for scheduled back sebaceous cyst excision.    HPI  Past Medical History:  Diagnosis Date  . Allergy   . Arthritis   . BPH (benign prostatic hyperplasia)   . Hyperlipidemia     Past Surgical History:  Procedure Laterality Date  . COLONOSCOPY  2003, 10/12/13   Dr. [name], Dr [name]  . Trans Urethral Microwave Therapy      Family History  Problem Relation Age of Onset  . Diabetes Father   . Heart disease Father        CABG after 48  . Prostate cancer Maternal Uncle   . Kidney cancer Neg Hx   . Bladder Cancer Neg Hx     Social History Social History   Tobacco Use  . Smoking status: Never Smoker  . Smokeless tobacco: Never Used  Substance Use Topics  . Alcohol use: Yes    Alcohol/week: 1.5 oz    Types: 3 drink(s) per week  . Drug use: No    No Known Allergies  Current Outpatient Medications  Medication Sig Dispense Refill  . finasteride (PROSCAR) 5 MG tablet Take 1 tablet (5 mg total) by mouth daily. 90 tablet 3  . fluticasone (FLONASE) 50 MCG/ACT nasal spray     . ibuprofen (ADVIL,MOTRIN) 200 MG tablet Take 200 mg by mouth every 6 (six) hours as needed.    . rosuvastatin (CRESTOR) 10 MG tablet Take 1 tablet (10 mg total) by mouth daily. 30 tablet 5   No current facility-administered medications for this visit.     Review of Systems Review of Systems  Constitutional: Negative.   Skin: Negative.     Blood pressure 110/68, pulse 69, resp. rate 13, height 5\' 9"  (1.753 m), weight 147 lb (66.7 kg).  Physical Exam Physical Exam  Constitutional: He is oriented to person, place, and time. He appears well-developed and well-nourished.  HENT:  Head: Normocephalic and atraumatic.  Pulmonary/Chest: Effort normal.      Neurological: He is alert and oriented to person,  place, and time.  Skin: Skin is warm and dry. No erythema.  Psychiatric: He has a normal mood and affect. His behavior is normal.    Data Reviewed Previous notes  Assessment    1.5 cm back sebaceous cyst. Completed course of doxycycline. No signs of infection on exam. Consented to in office excision.   Procedure: In office sebaceous cyst excision, back  Patient was placed in prone position. 10 cc mixture of 1% lidocaine and 0.5% bupivacaine was infiltrated around the cyst circumferentially. The area was prepped and draped in sterile fashion. An elliptical incision was made around the cyst and extended through the subcutaneous tissue. Electric cautery was used for hemostasis.The cyst was fully excised, measured 2cm, and was sent to pathology. The skin was re-approximated using 4-0 prolene The excision site was dressed with neosporin, Telfa, gauze, and Tegaderm. The patient tolerated the procedure well. Educated patient on wound care, will return in 2 weeks for suture removal.      Plan    Patient to follow up in 2 weeks for suture removal. Will contact him with the pathology report once received.     HPI, Physical Exam, Assessment and Plan have been scribed under the direction and in the presence of S.  Robinette Haines, MD  [name], LPN  I have completed the exam and reviewed the above documentation for accuracy and completeness.  I agree with the above.  Haematologist has been used and any errors in dictation or transcription are unintentional.  Dmetrius Ambs G. Jamal Collin, M.D., F.A.C.S.  Junie Panning G 10/26/2017, 2:51 PM

## 2017-11-02 ENCOUNTER — Ambulatory Visit (INDEPENDENT_AMBULATORY_CARE_PROVIDER_SITE_OTHER): Payer: Medicare Other | Admitting: *Deleted

## 2017-11-02 DIAGNOSIS — L723 Sebaceous cyst: Secondary | ICD-10-CM

## 2017-11-02 NOTE — Progress Notes (Signed)
The sutures were removed  Patient came in today for a wound check.  The wound is clean, with no signs of infection noted.

## 2017-11-02 NOTE — Patient Instructions (Signed)
Return as needed

## 2017-11-03 DIAGNOSIS — D2261 Melanocytic nevi of right upper limb, including shoulder: Secondary | ICD-10-CM | POA: Diagnosis not present

## 2017-11-03 DIAGNOSIS — L708 Other acne: Secondary | ICD-10-CM | POA: Diagnosis not present

## 2017-11-03 DIAGNOSIS — D225 Melanocytic nevi of trunk: Secondary | ICD-10-CM | POA: Diagnosis not present

## 2017-11-03 DIAGNOSIS — Z85828 Personal history of other malignant neoplasm of skin: Secondary | ICD-10-CM | POA: Diagnosis not present

## 2017-11-16 ENCOUNTER — Ambulatory Visit (INDEPENDENT_AMBULATORY_CARE_PROVIDER_SITE_OTHER): Payer: Medicare Other | Admitting: Family Medicine

## 2017-11-16 ENCOUNTER — Encounter: Payer: Self-pay | Admitting: Family Medicine

## 2017-11-16 VITALS — BP 140/94 | HR 64 | Temp 98.3°F | Resp 18 | Ht 69.0 in | Wt 148.1 lb

## 2017-11-16 DIAGNOSIS — R05 Cough: Secondary | ICD-10-CM | POA: Diagnosis not present

## 2017-11-16 DIAGNOSIS — H66003 Acute suppurative otitis media without spontaneous rupture of ear drum, bilateral: Secondary | ICD-10-CM | POA: Diagnosis not present

## 2017-11-16 DIAGNOSIS — R059 Cough, unspecified: Secondary | ICD-10-CM

## 2017-11-16 MED ORDER — AMOXICILLIN-POT CLAVULANATE 875-125 MG PO TABS: 1.0000 | ORAL_TABLET | Freq: Two times a day (BID) | ORAL | 0 refills | Status: DC

## 2017-11-16 MED ORDER — PREDNISONE 10 MG PO TABS: ORAL_TABLET | ORAL | 0 refills | Status: DC

## 2017-11-16 MED ORDER — BENZONATATE 100 MG PO CAPS: 100.0000 mg | ORAL_CAPSULE | Freq: Three times a day (TID) | ORAL | 0 refills | Status: DC

## 2017-11-16 NOTE — Progress Notes (Signed)
Pre-visit discussion using our clinic review tool. No additional management support is needed unless otherwise documented below in the visit note.  

## 2017-11-16 NOTE — Patient Instructions (Signed)
Please drink plenty of water so that your urine is pale yellow or clear. Also, get plenty of rest, use tylenol as needed for discomfort and follow up if symptoms do not improve in 3 to 4 days, worsen, or you develop a fever >101.  Please take medication with food.   Otitis Media, Adult Otitis media is redness, soreness, and puffiness (swelling) in the space just behind your eardrum (middle ear). It may be caused by allergies or infection. It often happens along with a cold. Follow these instructions at home:  Take your medicine as told. Finish it even if you start to feel better.  Only take over-the-counter or prescription medicines for pain, discomfort, or fever as told by your doctor.  Follow up with your doctor as told. Contact a doctor if:  You have otitis media only in one ear, or bleeding from your nose, or both.  You notice a lump on your neck.  You are not getting better in 3-5 days.  You feel worse instead of better. Get help right away if:  You have pain that is not helped with medicine.  You have puffiness, redness, or pain around your ear.  You get a stiff neck.  You cannot move part of your face (paralysis).  You notice that the bone behind your ear hurts when you touch it. This information is not intended to replace advice given to you by your health care provider. Make sure you discuss any questions you have with your health care provider. Document Released: 05/05/2008 Document Revised: 04/24/2016 Document Reviewed: 06/14/2013 Elsevier Interactive Patient Education  2017 [employer].

## 2017-11-16 NOTE — Progress Notes (Signed)
Patient ID: Johnny Munoz, male   DOB: [birth year], 66 y.o.   MRN: [id number]  PCP: Johnny Mc, MD  Subjective:  Johnny Munoz is a 66 y.o. year old very pleasant male patient who presents with Upper Respiratory infection symptoms including nasal congestion, sore throat, post nasal drip, cough that is non productive, sinus pressure/pain, ear pain bilaterally -started: 5 days ago, symptoms are not improving.  -previous treatments: flonase has provided limited benefit. -sick contacts/travel/risks: denies flu exposure. 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 finasteride (PROSCAR) 5 MG tablet Take 1 tablet (5 mg total) by mouth daily. 90 tablet 3  . fluticasone (FLONASE) 50 MCG/ACT nasal spray     . ibuprofen (ADVIL,MOTRIN) 200 MG tablet Take 200 mg by mouth every 6 (six) hours as needed.    . rosuvastatin (CRESTOR) 10 MG tablet Take 1 tablet (10 mg total) by mouth daily. 30 tablet 5   No current facility-administered medications for this visit.     Objective: BP (!) 140/94   Pulse 64   Temp 98.3 F (36.8 C) (Oral)   Resp 18   Ht 5\' 9"  (1.753 m)   Wt 148 lb 2 oz (67.2 kg)   SpO2 97%   BMI 21.87 kg/m  Gen: NAD, resting comfortably HEENT: Turbinates erythematous, TM erythematous bilaterally; bulging; pharynx mildly erythematous with no tonsilar exudate or edema, +frontal/maxillary sinus tenderness CV: RRR no murmurs rubs or gallops Lungs: CTAB no crackles, wheeze, rhonchi Abdomen: soft/nontender/nondistended/normal bowel sounds. No rebound or guarding.   Ext: no edema Skin: warm, dry, no rash Neuro: grossly normal, moves all extremities  Assessment/Plan: 1. Acute suppurative otitis media of both ears without spontaneous rupture of tympanic membranes, recurrence not specified Exam and history with recent travel support treatment. Augmentin chosen with maxillary/frontal sinus tenderness noted also.  2. Cough Short course of a prednisone taper chosen for cough and ear discomfort. Benzonatate for cough also provided.    We discussed that we did not find any signs of pneumonia or strep throat today.  Finally, we reviewed reasons to return to care including if symptoms worsen or persist or new concerns arise- once again particularly shortness of breath, increasing ear pain, or fever. Written return precautions provided.    Laurita Quint, FNP

## 2017-12-09 NOTE — Progress Notes (Signed)
12/10/2017 10:49 AM   Johnny Munoz 1951-03-13 [id number]  Referring provider: [name], MD [place], [place] [address]  Chief Complaint  Patient presents with  . Benign Prostatic Hypertrophy  . Follow-up    HPI: Patient is a 67 year old Caucasian male with a history of elevated PSA and BPH with LUTS who would like to establish care with a local urologist.  History of elevated PSA Patient states about 5 years ago he underwent a prostate biopsy for a PSA level that  4.1  with Dr. [name] at the [facility] urology office. He states the biopsy results were benign.  His most recent PSA is 1.0 on 08/27/2016.  BPH WITH LUTS His IPSS score today is 8, which is moderate lower urinary tract symptomatology. He is mostly satisfied with his quality life due to his urinary symptoms. His previous I PSS score was 10/2.  He denies any dysuria, hematuria or suprapubic pain.  He is taking finasteride 5 mg daily.  He underwent a TUMT with Dr. [name] in 2008.  He also denies any recent fevers, chills, nausea or vomiting.  He has a family history of PCa, with a maternal uncle with prostate cancer.    IPSS    Row Name 12/10/17 1000         International Prostate Symptom Score   How often have you had the sensation of not emptying your bladder?  Less than 1 in 5     How often have you had to urinate less than every two hours?  Less than half the time     How often have you found you stopped and started again several times when you urinated?  Less than 1 in 5 times     How often have you found it difficult to postpone urination?  Less than 1 in 5 times     How often have you had a weak urinary stream?  Less than half the time     How often have you had to strain to start urination?  Not at All     How many times did you typically get up at night to urinate?  1 Time     Total IPSS Score  8       Quality of Life due to urinary symptoms   If you were to spend  the rest of your life with your urinary condition just the way it is now how would you feel about that?  Mostly Satisfied        Score:  1-7 Mild 8-19 Moderate 20-35 Severe  PMH: Past Medical History:  Diagnosis Date  . Allergy   . Arthritis   . BPH (benign prostatic hyperplasia)   . Hyperlipidemia     Surgical History: Past Surgical History:  Procedure Laterality Date  . COLONOSCOPY  2003, 10/12/13   Dr. [name], Dr [name]  . Trans Urethral Microwave Therapy      Home Medications:  Allergies as of 12/10/2017   No Known Allergies     Medication List        Accurate as of 12/10/17 10:49 AM. Always use your most recent med list.          finasteride 5 MG tablet Commonly known as:  PROSCAR Take 1 tablet (5 mg total) by mouth daily.   fluticasone 50 MCG/ACT nasal spray Commonly known as:  FLONASE   ibuprofen 200 MG tablet Commonly known as:  ADVIL,MOTRIN Take 200 mg  by mouth every 6 (six) hours as needed.   rosuvastatin 10 MG tablet Commonly known as:  CRESTOR Take 1 tablet (10 mg total) by mouth daily.       Allergies: No Known Allergies  Family History: Family History  Problem Relation Age of Onset  . Diabetes Father   . Heart disease Father        CABG after 81  . Prostate cancer Maternal Uncle   . Kidney cancer Neg Hx   . Bladder Cancer Neg Hx     Social History:  reports that  has never smoked. he has never used smokeless tobacco. He reports that he drinks about 1.5 oz of alcohol per week. He reports that he does not use drugs.  ROS: UROLOGY Frequent Urination?: No Hard to postpone urination?: No Burning/pain with urination?: No Get up at night to urinate?: No Leakage of urine?: No Urine stream starts and stops?: No Trouble starting stream?: No Do you have to strain to urinate?: No Blood in urine?: No Urinary tract infection?: No Sexually transmitted disease?: No Injury to kidneys or bladder?: No Painful intercourse?: No Weak  stream?: No Erection problems?: No Penile pain?: No  Gastrointestinal Nausea?: No Vomiting?: No Indigestion/heartburn?: No Diarrhea?: No Constipation?: No  Constitutional Fever: No Night sweats?: No Weight loss?: No Fatigue?: No  Skin Skin rash/lesions?: No Itching?: No  Eyes Blurred vision?: No Double vision?: No  Ears/Nose/Throat Sore throat?: No Sinus problems?: Yes  Hematologic/Lymphatic Swollen glands?: No Easy bruising?: No  Cardiovascular Leg swelling?: No Chest pain?: No  Respiratory Cough?: No Shortness of breath?: No  Endocrine Excessive thirst?: No  Musculoskeletal Back pain?: No Joint pain?: No  Neurological Headaches?: No Dizziness?: No  Psychologic Depression?: No Anxiety?: No  Physical Exam: BP (!) 149/92   Pulse 66   Ht 5\' 9"  (1.753 m)   Wt 145 lb (65.8 kg)   BMI 21.41 kg/m   Constitutional: Well nourished. Alert and oriented, No acute distress. HEENT: [place] AT, moist mucus membranes. Trachea midline, no masses. Cardiovascular: No clubbing, cyanosis, or edema. Respiratory: Normal respiratory effort, no increased work of breathing. GI: Abdomen is soft, non tender, non distended, no abdominal masses. Liver and spleen not palpable.  No hernias appreciated.  Stool sample for occult testing is not indicated.   GU: No CVA tenderness.  No bladder fullness or masses.  Patient with circumcised phallus.  Urethral meatus is patent.  No penile discharge. No penile lesions or rashes. Scrotum without lesions, cysts, rashes and/or edema.  Testicles are located scrotally bilaterally. No masses are appreciated in the testicles. Left and right epididymis are normal. Rectal: Patient with  normal sphincter tone. Anus and perineum without scarring or rashes. No rectal masses are appreciated. Prostate is approximately 50 grams, no nodules are appreciated. Seminal vesicles are normal. Skin: No rashes, bruises or suspicious lesions. Lymph: No cervical or  inguinal adenopathy. Neurologic: Grossly intact, no focal deficits, moving all 4 extremities. Psychiatric: Normal mood and affect.   Laboratory Data:  Lab Results  Component Value Date   CREATININE 0.98 09/23/2017    Lab Results  Component Value Date   PSA 1.0 08/27/2016   PSA 0.75 03/06/2016   PSA 2.11 06/28/2013  PSA   1.2   08/2015 PSA    1.0 08/2016  I have reviewed the labs  Assessment & Plan:    1. History of elevated PSA  - Patient underwent a biopsy of his prostate in November 2012 for a PSA of 4.1,  biopsy was benign  - Most recent PSA was 1.0 in September 2017  - PSA is drawn today -if stable - RTC 1 year for I PSS score, exam and PSA.   2. BPH with LUTS  - IPSS score is 11/2, it is worsening  - Continue conservative management, avoiding bladder irritants and timed voiding's  - Continue finasteride 5 mg daily; refill given  - RTC in 12 months for IPSS, PSA and exam     Return in about 1 year (around 12/10/2018) for IPSS, PSA and exam.  These notes generated with voice recognition software. I apologize for typographical errors.  Zara Council, [facility] [address], [place], [place] [address] [phone]

## 2017-12-10 ENCOUNTER — Encounter: Payer: Self-pay | Admitting: Urology

## 2017-12-10 ENCOUNTER — Ambulatory Visit (INDEPENDENT_AMBULATORY_CARE_PROVIDER_SITE_OTHER): Payer: Medicare Other | Admitting: Urology

## 2017-12-10 VITALS — BP 149/92 | HR 66 | Ht 69.0 in | Wt 145.0 lb

## 2017-12-10 DIAGNOSIS — N138 Other obstructive and reflux uropathy: Secondary | ICD-10-CM | POA: Diagnosis not present

## 2017-12-10 DIAGNOSIS — N401 Enlarged prostate with lower urinary tract symptoms: Secondary | ICD-10-CM | POA: Diagnosis not present

## 2017-12-10 DIAGNOSIS — Z87898 Personal history of other specified conditions: Secondary | ICD-10-CM | POA: Diagnosis not present

## 2017-12-10 MED ORDER — FINASTERIDE 5 MG PO TABS: 5.0000 mg | ORAL_TABLET | Freq: Every day | ORAL | 3 refills | Status: DC

## 2017-12-11 LAB — PSA: Prostate Specific Ag, Serum: 0.9 ng/mL (ref 0.0–4.0)

## 2018-02-01 DIAGNOSIS — H6980 Other specified disorders of Eustachian tube, unspecified ear: Secondary | ICD-10-CM | POA: Diagnosis not present

## 2018-02-01 DIAGNOSIS — H6123 Impacted cerumen, bilateral: Secondary | ICD-10-CM | POA: Diagnosis not present

## 2018-02-02 ENCOUNTER — Telehealth: Payer: Self-pay | Admitting: Internal Medicine

## 2018-02-02 DIAGNOSIS — E7800 Pure hypercholesterolemia, unspecified: Secondary | ICD-10-CM

## 2018-02-02 DIAGNOSIS — Z79899 Other long term (current) drug therapy: Secondary | ICD-10-CM

## 2018-02-02 NOTE — Telephone Encounter (Signed)
Copied from [place]. Topic: Appointment Scheduling - Scheduling Inquiry for Clinic >> Feb 02, 2018  9:55 AM Aurelio Brash B wrote: Reason for CRM:Pt is asking if he needs to make an apt for lab work  because he is on crestor and hasn't had labs in 6 months

## 2018-02-03 NOTE — Telephone Encounter (Signed)
Pt is scheduled for a fasting lab appt 03/22/2018. Pt is aware of appt date and time.

## 2018-02-03 NOTE — Telephone Encounter (Signed)
Last CMP was 09/23/2017 and last lipid panel was 08/25/2017.

## 2018-02-03 NOTE — Telephone Encounter (Signed)
Fasting Labs are due in late April and have been iordered

## 2018-02-11 ENCOUNTER — Other Ambulatory Visit: Payer: Self-pay | Admitting: Internal Medicine

## 2018-03-22 ENCOUNTER — Other Ambulatory Visit (INDEPENDENT_AMBULATORY_CARE_PROVIDER_SITE_OTHER): Payer: Medicare Other

## 2018-03-22 DIAGNOSIS — E7800 Pure hypercholesterolemia, unspecified: Secondary | ICD-10-CM | POA: Diagnosis not present

## 2018-03-22 DIAGNOSIS — Z79899 Other long term (current) drug therapy: Secondary | ICD-10-CM

## 2018-03-22 LAB — COMPREHENSIVE METABOLIC PANEL
ALT: 27 U/L (ref 0–53)
AST: 23 U/L (ref 0–37)
Albumin: 4.3 g/dL (ref 3.5–5.2)
Alkaline Phosphatase: 51 U/L (ref 39–117)
BUN: 21 mg/dL (ref 6–23)
CALCIUM: 9.5 mg/dL (ref 8.4–10.5)
CHLORIDE: 104 meq/L (ref 96–112)
CO2: 28 meq/L (ref 19–32)
Creatinine, Ser: 1.02 mg/dL (ref 0.40–1.50)
GFR: 77.55 mL/min (ref >60.00)
GLUCOSE: 107 mg/dL — AB (ref 70–99)
Potassium: 4.5 mEq/L (ref 3.5–5.1)
Sodium: 140 mEq/L (ref 135–145)
Total Bilirubin: 0.4 mg/dL (ref 0.2–1.2)
Total Protein: 6.6 g/dL (ref 6.0–8.3)

## 2018-03-22 LAB — LIPID PANEL
CHOL/HDL RATIO: 2
Cholesterol: 155 mg/dL (ref 0–200)
HDL: 68.3 mg/dL (ref >39.00)
LDL CALC: 73 mg/dL (ref 0–99)
NONHDL: 87.05
TRIGLYCERIDES: 69 mg/dL (ref 0.0–149.0)
VLDL: 13.8 mg/dL (ref 0.0–40.0)

## 2018-04-24 ENCOUNTER — Ambulatory Visit (INDEPENDENT_AMBULATORY_CARE_PROVIDER_SITE_OTHER): Payer: Medicare Other | Admitting: Family Medicine

## 2018-04-24 ENCOUNTER — Encounter: Payer: Self-pay | Admitting: Family Medicine

## 2018-04-24 VITALS — BP 110/78 | HR 56 | Temp 98.8°F | Resp 14 | Ht 69.0 in | Wt 147.0 lb

## 2018-04-24 DIAGNOSIS — M79661 Pain in right lower leg: Secondary | ICD-10-CM | POA: Diagnosis not present

## 2018-04-24 DIAGNOSIS — E785 Hyperlipidemia, unspecified: Secondary | ICD-10-CM | POA: Diagnosis not present

## 2018-04-24 MED ORDER — DICLOFENAC SODIUM 75 MG PO TBEC: 75.0000 mg | DELAYED_RELEASE_TABLET | Freq: Two times a day (BID) | ORAL | 0 refills | Status: DC

## 2018-04-24 NOTE — Progress Notes (Signed)
Subjective:  Johnny Munoz is a 67 y.o. year old very pleasant male patient who presents for/with See problem oriented charting ROS- no fever. Has had some redness on right lower leg as well as pain in this area. Some ankle swelling but no calf pain or tenderness.    Past Medical History-  Patient Active Problem List   Diagnosis Date Noted  . Infected sebaceous cyst 08/26/2017  . Insomnia 08/21/2016  . Benign essential tremor 08/21/2016  . History of elevated PSA 12/15/2015  . Welcome to Medicare preventive visit 06/28/2013  . Osteoarthritis 06/14/2012  . Hyperlipidemia, unspecified 06/14/2012  . Enlarged prostate with lower urinary tract symptoms (LUTS) 06/14/2012    Medications- reviewed and updated Current Outpatient Medications  Medication Sig Dispense Refill  . finasteride (PROSCAR) 5 MG tablet Take 1 tablet (5 mg total) by mouth daily. 90 tablet 3  . fluticasone (FLONASE) 50 MCG/ACT nasal spray     . ibuprofen (ADVIL,MOTRIN) 200 MG tablet Take 200 mg by mouth every 6 (six) hours as needed.    . rosuvastatin (CRESTOR) 10 MG tablet TAKE ONE TABLET BY MOUTH DAILY 90 tablet 4  . diclofenac (VOLTAREN) 75 MG EC tablet Take 1 tablet (75 mg total) by mouth 2 (two) times daily. 14 tablet 0   No current facility-administered medications for this visit.     Objective: BP 110/78 (BP Location: Left Arm, Patient Position: Sitting, Cuff Size: Normal)   Pulse (!) 56   Temp 98.8 F (37.1 C) (Oral)   Resp 14   Ht 5\' 9"  (1.753 m)   Wt 147 lb (66.7 kg)   SpO2 97%   BMI 21.71 kg/m  Gen: NAD, resting comfortably CV: slightly bradycardic but regular Lungs: nonlabored, normal respiratory rate Abdomen: soft/nontender.  Ext: no edema in left leg, edema noted in right ankle about 1+. When I invert the foot or dorsiflex the foot with my hand just above the malleolus there is a rubbing sensation to me and to patient.  Skin: warm, dry, mild erythema above lateral malleolus- some  tenderness to palpation. There is a small whitish bump where most of the erythema surrounds      Assessment/Plan:   Pain in right lower leg - Plan: Ambulatory referral to Sports Medicine S:  Patient working out in the yard this past week- pushing much harder than usual.  Has then noted some very mild redness and warmth in the area. He doesn't have pain with walking - only has pain if foot gets in certain angles such as if he inverts or dorsiflexes. He also after the pain noted a small bump on the right lower leg which looked like a bite to him.Denies recent travel prolonged. No history dvt/PE A/P: This could be tendonitis- I do not know what to make about the rubbing sensation in right lower leg above medial malleolus when he dorsiflexes or inverts the foot- So I have recommended evaluation by Dr. Paulla Fore. I do not think he has cellulitis. Could be thrombophlebitis but nsaids could help for this as well.   from avs "Start antiiinflammatory voltaren until you see Dr. Paulla Fore  I think you have tweaked/inflammed a tendon. Another possibility is a superficial vein being inflammed- either way I think the medicine I am giving you will help. I want you to call our after hours line if you have redness expanding up the leg- I dont currently think this is an infection but if redness worsened would want to likely start antibiotic  See our sports medicine physician- Dr. Paulla Fore- schedule before you leave at the desk for Tuesday morning before your trip"  I did consider updating Td today but we were unable to locate Td alone and concerned patient may not be covered for Tdap with his insurance- thought reasonable to hold off as there was not a definite bite- more just possible bite.   Appears patient chose not to schedule with Dr. Paulla Fore for Tuesday before he left. He may have to be seen by his PCP in that case on that day if not improving  I think nsaids are reasonable for him- dose have some increased cardiac  risk with age and hyperlipidemia but lipids were well controlled on last check. No history of GERD on problem list or history of gastric ulcers so short term use reasonable  Future Appointments  Date Time Provider [place]  08/26/2018  8:30 AM [name], MD [facility]  12/13/2018  9:00 AM [facility] None  12/16/2018  8:30 AM McGowan, Larene Beach A, PA-C [facility] None   Lab/Order associations: Pain in right lower leg - Plan: Ambulatory referral to Sports Medicine  Meds ordered this encounter  Medications  . diclofenac (VOLTAREN) 75 MG EC tablet    Sig: Take 1 tablet (75 mg total) by mouth 2 (two) times daily.    Dispense:  14 tablet    Refill:  0    Return precautions advised.  [name], MD

## 2018-04-24 NOTE — Progress Notes (Signed)
Pre visit review using our clinic review tool, if applicable. No additional management support is needed unless otherwise documented below in the visit note. 

## 2018-04-24 NOTE — Patient Instructions (Signed)
Start antiiinflammatory voltaren until you see Dr. Paulla Fore  I think you have tweaked/inflammed a tendon. Another possibility is a superficial vein being inflammed- either way I think the medicine I am giving you will help. I want you to call our after hours line if you have redness expanding up the leg- I dont currently think this is an infection but if redness worsened would want to likely start antibiotic  See our sports medicine physician- Dr. Paulla Fore- schedule before you leave at the desk for Tuesday morning before your trip  [place], [place], [place] [address]

## 2018-05-03 ENCOUNTER — Ambulatory Visit: Payer: Medicare Other

## 2018-05-03 ENCOUNTER — Ambulatory Visit: Payer: PRIVATE HEALTH INSURANCE | Admitting: Sports Medicine

## 2018-05-03 ENCOUNTER — Encounter: Payer: Self-pay | Admitting: Podiatry

## 2018-05-03 ENCOUNTER — Ambulatory Visit (INDEPENDENT_AMBULATORY_CARE_PROVIDER_SITE_OTHER): Payer: Medicare Other | Admitting: Podiatry

## 2018-05-03 DIAGNOSIS — M779 Enthesopathy, unspecified: Secondary | ICD-10-CM

## 2018-05-03 NOTE — Progress Notes (Signed)
This patient presents to the office for an evaluation of his right foot/ankle/  He said approximately a week ago it became red, swollen and inflamed.  He was seen by me about 4 and Dr. [name].  He was treated with diclofenac for 7 days.  He says the first 3 days the swelling persisted, but over the weekend. The pain, swelling and inflammation has resolved.  He presents the office today for continued evaluation and treatment of his right ankle.  General Appearance  Alert, conversant and in no acute stress.  Vascular  Dorsalis pedis and posterior tibial  pulses are palpable  bilaterally.  Capillary return is within normal limits  bilaterally. Temperature is within normal limits  bilaterally.  Neurologic  Senn-Weinstein monofilament wire test within normal limits  bilaterally. Muscle power within normal limits bilaterally.  Nails Thick disfigured discolored nails with subungual debris  from hallux to fifth toes bilaterally. No evidence of bacterial infection or drainage bilaterally.  Orthopedic  No limitations of motion of motion feet .  No crepitus or effusions noted.  No bony pathology or digital deformities noted.  Skin  normotropic skin with no porokeratosis noted bilaterally.  No signs of infections or ulcers noted.    Peroneal tendinitis right foot/ankle  ROV>  Patient returns to the office not having any pain or discomfort in his right ankle.  Discussed his condition and we decided not to take x-rays nor provided treatment since all his pain and inflammation has resolved.  Return to clinic when necessary   Gardiner Barefoot DPM

## 2018-05-03 NOTE — Addendum Note (Signed)
Addended by: Graceann Congress D on: 05/03/2018 10:34 AM   Modules accepted: Orders

## 2018-07-31 ENCOUNTER — Encounter: Payer: Self-pay | Admitting: Emergency Medicine

## 2018-07-31 ENCOUNTER — Emergency Department: Payer: Medicare Other

## 2018-07-31 ENCOUNTER — Other Ambulatory Visit: Payer: Self-pay

## 2018-07-31 ENCOUNTER — Emergency Department
Admission: EM | Admit: 2018-07-31 | Discharge: 2018-07-31 | Disposition: A | Discharge status: Home / Self Care | Payer: Medicare Other | Attending: Emergency Medicine | Admitting: Emergency Medicine

## 2018-07-31 DIAGNOSIS — Z79899 Other long term (current) drug therapy: Secondary | ICD-10-CM | POA: Insufficient documentation

## 2018-07-31 DIAGNOSIS — R001 Bradycardia, unspecified: Secondary | ICD-10-CM

## 2018-07-31 DIAGNOSIS — S0090XA Unspecified superficial injury of unspecified part of head, initial encounter: Secondary | ICD-10-CM | POA: Diagnosis not present

## 2018-07-31 DIAGNOSIS — Y9301 Activity, walking, marching and hiking: Secondary | ICD-10-CM | POA: Diagnosis not present

## 2018-07-31 DIAGNOSIS — Y92008 Other place in unspecified non-institutional (private) residence as the place of occurrence of the external cause: Secondary | ICD-10-CM | POA: Diagnosis not present

## 2018-07-31 DIAGNOSIS — W19XXXA Unspecified fall, initial encounter: Secondary | ICD-10-CM | POA: Diagnosis not present

## 2018-07-31 DIAGNOSIS — Y999 Unspecified external cause status: Secondary | ICD-10-CM | POA: Insufficient documentation

## 2018-07-31 DIAGNOSIS — Z23 Encounter for immunization: Secondary | ICD-10-CM | POA: Insufficient documentation

## 2018-07-31 DIAGNOSIS — E039 Hypothyroidism, unspecified: Secondary | ICD-10-CM | POA: Diagnosis not present

## 2018-07-31 DIAGNOSIS — S0101XA Laceration without foreign body of scalp, initial encounter: Secondary | ICD-10-CM

## 2018-07-31 DIAGNOSIS — R55 Syncope and collapse: Secondary | ICD-10-CM

## 2018-07-31 DIAGNOSIS — S0990XA Unspecified injury of head, initial encounter: Secondary | ICD-10-CM | POA: Diagnosis not present

## 2018-07-31 DIAGNOSIS — R7989 Other specified abnormal findings of blood chemistry: Secondary | ICD-10-CM | POA: Insufficient documentation

## 2018-07-31 LAB — CBC
HCT: 47.3 % (ref 40.0–52.0)
HEMOGLOBIN: 16.3 g/dL (ref 13.0–18.0)
MCH: 31.9 pg (ref 26.0–34.0)
MCHC: 34.5 g/dL (ref 32.0–36.0)
MCV: 92.6 fL (ref 80.0–100.0)
PLATELETS: 251 10*3/uL (ref 150–440)
RBC: 5.11 MIL/uL (ref 4.40–5.90)
RDW: 14.2 % (ref 11.5–14.5)
WBC: 5.7 10*3/uL (ref 3.8–10.6)

## 2018-07-31 LAB — BASIC METABOLIC PANEL
ANION GAP: 7 (ref 5–15)
BUN: 19 mg/dL (ref 8–23)
CALCIUM: 9.3 mg/dL (ref 8.9–10.3)
CO2: 31 mmol/L (ref 22–32)
CREATININE: 0.98 mg/dL (ref 0.61–1.24)
Chloride: 103 mmol/L (ref 98–111)
Glucose, Bld: 127 mg/dL — ABNORMAL HIGH (ref 70–99)
Potassium: 4.3 mmol/L (ref 3.5–5.1)
Sodium: 141 mmol/L (ref 135–145)

## 2018-07-31 LAB — MAGNESIUM: MAGNESIUM: 2.2 mg/dL (ref 1.7–2.4)

## 2018-07-31 LAB — TROPONIN I: Troponin I: <0.03 ng/mL (ref <0.03)

## 2018-07-31 LAB — T4, FREE: FREE T4: 0.75 ng/dL — AB (ref 0.82–1.77)

## 2018-07-31 LAB — TSH: TSH: 5.667 u[IU]/mL — AB (ref 0.350–4.500)

## 2018-07-31 MED ORDER — ACETAMINOPHEN 500 MG PO TABS
1000.0000 mg | ORAL_TABLET | Freq: Once | ORAL | Status: AC
  Administered 2018-07-31: 1000 mg via ORAL
  Filled 2018-07-31: qty 2

## 2018-07-31 MED ORDER — SODIUM CHLORIDE 0.9 % IV BOLUS
1000.0000 mL | Freq: Once | INTRAVENOUS | Status: AC
  Administered 2018-07-31: 1000 mL via INTRAVENOUS

## 2018-07-31 MED ORDER — TETANUS-DIPHTH-ACELL PERTUSSIS 5-2.5-18.5 LF-MCG/0.5 IM SUSP
0.5000 mL | Freq: Once | INTRAMUSCULAR | Status: AC
  Administered 2018-07-31: 0.5 mL via INTRAMUSCULAR
  Filled 2018-07-31: qty 0.5

## 2018-07-31 NOTE — ED Notes (Signed)
Pt has surgically glued hair implant and prefers to call his hair rep to come remove hair piece so staff can assess for lacs.

## 2018-07-31 NOTE — Discharge Instructions (Addendum)
As I explained to you your thyroid hormone levels were slightly low.  Make sure to follow-up with your primary care doctor on Tuesday for further evaluation.  Also follow-up with cardiology in the next 3 to 4 days for further monitoring.  Return to the emergency room for any further episodes of dizziness or passing out, chest pain or palpitations.  Keep laceration dry and clean. Wash with warm water and soap. Apply topical bacitracin. Protect from the sun to minimize scarring. Cover it with SPF 16 or higher and use hat when out in the sun for 6-9 months. You received 3 staples that must be removed in 7-10 days.  Watch for signs of infection: pus, redness of the skin surrounding it, or fever. If these develop see your doctor or return to the ER for antibiotics.

## 2018-07-31 NOTE — ED Provider Notes (Signed)
[facility] Emergency Department Provider Note  ____________________________________________  Time seen: Approximately 7:25 AM  I have reviewed the triage vital signs and the nursing notes.   HISTORY  Chief Complaint Loss of Consciousness and Laceration   HPI Johnny Munoz is a 67 y.o. male with a history of BPH, hyperlipidemia, and allergies who presents for evaluation of a syncopal event.  Patient reports that he woke up this morning and was on his way to the bathroom when he started feeling dizzy.  His wife heard a loud noise and found patient on the ground.  He had his eyes open but was not responding initially.  She then noted that he had blood coming from his head so she called 911.  Patient denies headache, chest pain, palpitations, shortness of breath, back pain preceding the syncopal event.  He is now complaining of moderate sharp pain located in the back of his head since the fall.  He denies neck pain, back pain, extremity pain, chest pain or shortness of breath.  Last tetanus shot was 2009.  Patient reports having a couple of prior episodes of syncope with the last one being 20 years ago.   Past Medical History:  Diagnosis Date  . Allergy   . Arthritis   . BPH (benign prostatic hyperplasia)   . Hyperlipidemia     Patient Active Problem List   Diagnosis Date Noted  . Infected sebaceous cyst 08/26/2017  . Insomnia 08/21/2016  . Benign essential tremor 08/21/2016  . History of elevated PSA 12/15/2015  . Welcome to Medicare preventive visit 06/28/2013  . Osteoarthritis 06/14/2012  . Hyperlipidemia, unspecified 06/14/2012  . Enlarged prostate with lower urinary tract symptoms (LUTS) 06/14/2012    Past Surgical History:  Procedure Laterality Date  . COLONOSCOPY  2003, 10/12/13   Dr. [name], Dr [name]  . Trans Urethral Microwave Therapy      Prior to Admission medications   Medication Sig Start Date End Date Taking? Authorizing  Provider  diclofenac (VOLTAREN) 75 MG EC tablet Take 1 tablet (75 mg total) by mouth 2 (two) times daily. 04/24/18   Marin Olp, MD  finasteride (PROSCAR) 5 MG tablet Take 1 tablet (5 mg total) by mouth daily. 12/10/17   McGowan, Hunt Oris, PA-C  fluticasone (FLONASE) 50 MCG/ACT nasal spray  03/12/17   [provider]  ibuprofen (ADVIL,MOTRIN) 200 MG tablet Take 200 mg by mouth every 6 (six) hours as needed.    [provider]  rosuvastatin (CRESTOR) 10 MG tablet TAKE ONE TABLET BY MOUTH DAILY 02/12/18   [name], MD    Allergies Patient has no known allergies.  Family History  Problem Relation Age of Onset  . Diabetes Father   . Heart disease Father        CABG after 7  . Prostate cancer Maternal Uncle   . Kidney cancer Neg Hx   . Bladder Cancer Neg Hx     Social History Social History   Tobacco Use  . Smoking status: Never Smoker  . Smokeless tobacco: Never Used  Substance Use Topics  . Alcohol use: Yes    Alcohol/week: 3.0 standard drinks    Types: 3 drink(s) per week  . Drug use: No    Review of Systems Constitutional: Negative for fever. + syncope Eyes: Negative for visual changes. ENT: Negative for facial injury or neck injury Cardiovascular: Negative for chest injury. Respiratory: Negative for shortness of breath. Negative for chest wall injury. Gastrointestinal:  Negative for abdominal pain or injury. Genitourinary: Negative for dysuria. Musculoskeletal: Negative for back injury, negative for arm or leg pain. Skin: + scalp laceration Neurological: + head injury.  ____________________________________________   PHYSICAL EXAM:  VITAL SIGNS: ED Triage Vitals  Enc Vitals Group     BP 07/31/18 0658 (!) 149/90     Pulse Rate 07/31/18 0658 (!) 50     Resp 07/31/18 0658 20     Temp 07/31/18 0658 98 F (36.7 C)     Temp Source 07/31/18 0658 Oral     SpO2 07/31/18 0658 99 %     Weight 07/31/18 0655 144 lb (65.3 kg)     Height  07/31/18 0655 5\' 8"  (1.727 m)     Head Circumference --      Peak Flow --      Pain Score 07/31/18 0655 5     Pain Loc --      Pain Edu? --      Excl. in [place]? --     Constitutional: Alert and oriented. No acute distress. Does not appear intoxicated. HEENT Head: Normocephalic and atraumatic. Face: No facial bony tenderness. Stable midface Ears: No hemotympanum bilaterally. No Battle sign Eyes: No eye injury. PERRL. No raccoon eyes Nose: Nontender. No epistaxis. No rhinorrhea Mouth/Throat: Mucous membranes are moist. No oropharyngeal blood. No dental injury. Airway patent without stridor. Normal voice. Neck: no C-collar in place. No midline c-spine tenderness.  Cardiovascular: Normal rate, regular rhythm. Normal and symmetric distal pulses are present in all extremities. Pulmonary/Chest: Chest wall is stable and nontender to palpation/compression. Normal respiratory effort. Breath sounds are normal. No crepitus.  Abdominal: Soft, nontender, non distended. Musculoskeletal: Nontender with normal full range of motion in all extremities. No deformities. No thoracic or lumbar midline spinal tenderness. Pelvis is stable. Skin: Skin is warm, dry and intact. No abrasions or contutions. Psychiatric: Speech and behavior are appropriate. Neurological: Normal speech and language. A & O x3, PERRL, EOMI, no nystagmus, CN II-XII intact, motor testing reveals good tone and bulk throughout. There is no evidence of pronator drift or dysmetria. Muscle strength is 5/5 throughout. Sensory examination is intact. Gait deferred  Glascow Coma Score: 4 - Opens eyes on own 6 - Follows simple motor commands 5 - Alert and oriented GCS: 15 ____________________________________________   LABS (all labs ordered are listed, but only abnormal results are displayed)  Labs Reviewed  BASIC METABOLIC PANEL - Abnormal; Notable for the following components:      Result Value   Glucose, Bld 127 (*)    All other  components within normal limits  TSH - Abnormal; Notable for the following components:   TSH 5.667 (*)    All other components within normal limits  T4, FREE - Abnormal; Notable for the following components:   Free T4 0.75 (*)    All other components within normal limits  CBC  MAGNESIUM  TROPONIN I  URINALYSIS, COMPLETE (UACMP) WITH MICROSCOPIC  CBG MONITORING, ED   ____________________________________________  EKG  ED ECG REPORT I, Rudene Re, the attending physician, personally viewed and interpreted this ECG.  Sinus bradycardia, rate 46, normal intervals, normal axis, no STE or depressions, no evidence of HOCM, AV block, delta wave, ARVD, prolonged QTc, WPW, or Brugada. No prior for comparison   ____________________________________________  RADIOLOGY  I have personally reviewed the images performed during this visit and I agree with the Radiologist's read.   Interpretation by Radiologist:  Ct Head Wo Contrast  Result Date: 07/31/2018  CLINICAL DATA:  Pt states he passed out in the bathroom this am. Pt states he hit the back of his head. Pt denies hx of stroke or seizure. Pt states he has hx of skin ca. EXAM: CT HEAD WITHOUT CONTRAST TECHNIQUE: Contiguous axial images were obtained from the base of the skull through the vertex without intravenous contrast. COMPARISON:  None. FINDINGS: Brain: No evidence of acute infarction, hemorrhage, hydrocephalus, extra-axial collection or mass lesion/mass effect. Small area of hypoattenuation in the right caudate nucleus head consistent with an old lacune infarct. Vascular: No hyperdense vessel or unexpected calcification. Skull: Normal. Negative for fracture or focal lesion. Sinuses/Orbits: Visualized globes and orbits are unremarkable. Mild inferior frontal and bilateral ethmoid sinus mucosal thickening. Clear mastoid air cells. Other: None. IMPRESSION: 1. No acute intracranial abnormalities.  No skull fracture. Electronically Signed    By: Lajean Manes M.D.   On: 07/31/2018 08:09      ____________________________________________   PROCEDURES  Procedure(s) performed:yes .Marland KitchenLaceration Repair Date/Time: 07/31/2018 10:33 AM Performed by: Rudene Re, MD Authorized by: Rudene Re, MD   Consent:    Consent obtained:  Verbal   Consent given by:  Patient   Risks discussed:  Infection, pain, retained foreign body, poor cosmetic result and poor wound healing Anesthesia (see MAR for exact dosages):    Anesthesia method:  None Laceration details:    Location:  Scalp   Length (cm):  3 Repair type:    Repair type:  Simple Exploration:    Hemostasis achieved with:  Direct pressure   Wound exploration: entire depth of wound probed and visualized     Wound extent: no fascia violation noted, no foreign bodies/material noted and no underlying fracture noted     Contaminated: no   Treatment:    Area cleansed with:  Saline   Amount of cleaning:  Extensive   Irrigation solution:  Sterile saline   Visualized foreign bodies/material removed: no   Skin repair:    Repair method:  Staples   Number of staples:  3 Approximation:    Approximation:  Close Post-procedure details:    Dressing:  Sterile dressing   Patient tolerance of procedure:  Tolerated well, no immediate complications   Critical Care performed:  None ____________________________________________   INITIAL IMPRESSION / ASSESSMENT AND PLAN / ED COURSE   67 y.o. male with a history of BPH, hyperlipidemia, and allergies who presents for evaluation of a syncopal event preceded by dizziness this morning.  Patient is neurologically intact, no signs or symptoms of stroke, no signs or symptoms of basilar skull fracture.  Patient has dried blood in his scalp and a hair implant overlying it.  We will clean up and evaluate for any lacerations requiring staples.  Last tetanus shot was 2009 will provide patient with a booster.  EKG showing sinus bradycardia  with rate in the mid 40s.  Patient reports that this is new for him.  Review of epic shows the patient's heart rate is usually in the 50s and 60s.  Will monitor patient carefully on telemetry for any signs of arrhythmia.  Will check orthostatic vital signs.  Will check labs to rule out anemia, electrolyte abnormalities, or dehydration.  Will check troponin to rule out ischemia.  Will check TSH to rule out thyroid dysfunction.    _________________________ 11:26 AM on 07/31/2018 -----------------------------------------  Patient was monitored on telemetry for several hours with several PVCs but no other arrhythmias seen.  I discussed with Dr. Ubaldo Glassing from cardiology who recommended  outpatient follow-up for Holter monitoring and echocardiogram.  Patient's heart rate has remained between the 50s and 60s for the majority of the duration of his stay.  His orthostatic negative.  His labs show borderline low free T4 which could be contributing to the bradycardia. Will have patient follow up with his PCP for now. Discussed return precautions for any signs of chest pain, palpitations, or any further episodes of dizziness.  Discussed removal of staples in 7 days and monitoring for any wound infection.   As part of my medical decision making, I reviewed the following data within the [place] History obtained from family, Nursing notes reviewed and incorporated, Labs reviewed , EKG interpreted , Radiograph reviewed , A consult was requested and obtained from this/these consultant(s) Cardiology, Notes from prior ED visits and [place] Controlled Substance Database    Pertinent labs & imaging results that were available during my care of the patient were reviewed by me and considered in my medical decision making (see chart for details).    ____________________________________________   FINAL CLINICAL IMPRESSION(S) / ED DIAGNOSES  Final diagnoses:  Syncope, unspecified syncope type  Sinus  bradycardia  Laceration of scalp, initial encounter  Low T4      NEW MEDICATIONS STARTED DURING THIS VISIT:  ED Discharge Orders    None       Note:  This document was prepared using Dragon voice recognition software and may include unintentional dictation errors.    Alfred Levins, [place], MD 07/31/18 [phone]

## 2018-07-31 NOTE — ED Triage Notes (Signed)
Pt arrives POV to triage with c/o syncopal episode in the BR this AM around 0615. Pt reports that he felt dizzy and then woke up on the floor with his wife standing over him. Pt has laceration to the back of his head but denies use of anticoagulants. Pt is ambulatory to triage and is in NAD.

## 2018-08-03 ENCOUNTER — Telehealth: Payer: Self-pay

## 2018-08-03 NOTE — Telephone Encounter (Signed)
Copied from [place] [id number]. Topic: Appointment Scheduling - Scheduling Inquiry for Clinic >> Aug 03, 2018  3:03 PM Conception Chancy, NT wrote: Reason for CRM: patient is calling and is needing to schedule a hospital follow up with Dr. Derrel Nip. Patient was seen in the hospital 07/31/18 from passing out in his house and gashing his head. He has 3 staples that will need to be removed by 08/10/18. Please contact to schedule as there was not anything available until 08/18/18.  Patient has been scheduled to see Philis Nettle on 08-10-18 patient is aware of appointment.

## 2018-08-04 DIAGNOSIS — R55 Syncope and collapse: Secondary | ICD-10-CM | POA: Diagnosis not present

## 2018-08-04 DIAGNOSIS — H6123 Impacted cerumen, bilateral: Secondary | ICD-10-CM | POA: Diagnosis not present

## 2018-08-06 ENCOUNTER — Ambulatory Visit (INDEPENDENT_AMBULATORY_CARE_PROVIDER_SITE_OTHER): Payer: Medicare Other | Admitting: Cardiovascular Disease

## 2018-08-06 ENCOUNTER — Ambulatory Visit (INDEPENDENT_AMBULATORY_CARE_PROVIDER_SITE_OTHER): Payer: Medicare Other

## 2018-08-06 ENCOUNTER — Encounter: Payer: Self-pay | Admitting: Cardiovascular Disease

## 2018-08-06 VITALS — BP 124/78 | Ht 69.0 in | Wt 144.5 lb

## 2018-08-06 DIAGNOSIS — R55 Syncope and collapse: Secondary | ICD-10-CM

## 2018-08-06 DIAGNOSIS — E785 Hyperlipidemia, unspecified: Secondary | ICD-10-CM | POA: Diagnosis not present

## 2018-08-06 NOTE — Progress Notes (Signed)
Cardiology Office Note   Date:  08/06/2018   ID:  Johnny Munoz, DOB 10/11/1951, MRN [id number]  PCP:  [name], MD  Cardiologist:   Kathlyn Sacramento, MD   Chief Complaint  Patient presents with  . other    Ref by Dr. Anda Latina for syncope. Meds reviewed by the pt. verbally. Pt. was at [facility] ER; syncope on Aug. 31, 2019.  Pt. c/o dizziness and headache with pressure in the head at times when standing up and has a fluttering in abdomen.       History of Present Illness: Johnny Munoz is a 67 y.o. male who referred by Dr. Tami Ribas for evaluation of syncope.  The patient has no prior cardiac history.  He has no history of diabetes or hypertension.  He has known history of treated hyperlipidemia.  There is no history of tobacco use.  He reports that his father had some heart disease but does not know the details.  The patient had a recent syncopal episode.  He woke up on Saturday morning and went upstairs to go to the bathroom.  As he entered the bathroom and before he got on the toilet he felt dizzy followed by a sudden episode of loss of consciousness.  His wife was downstairs and he had the noise.  She came upstairs and the patient was unconscious for about 1 to 2 minutes.  There was no seizure activities or incontinence.  The patient woke up and there was no significant confusion.  He had significant laceration on his head that required attention in the emergency room.  He had no preceding symptoms such as chest pain, shortness of breath or palpitations.  He only felt slightly dizzy followed by loss of consciousness.  He did notice recent episodes of fluttering in his heart associated with dizziness.  He went to the emergency room and he was noted to be bradycardic with heart rate in the 40s that subsequently improved.  His labs were unremarkable in addition to CT scan of the head.  He has not had any recurrent episodes. He walks 2 to 3 miles daily for exercise with no  exertional symptoms.    Past Medical History:  Diagnosis Date  . Allergy   . Arthritis   . BPH (benign prostatic hyperplasia)   . Hyperlipidemia     Past Surgical History:  Procedure Laterality Date  . COLONOSCOPY  2003, 10/12/13   Dr. [name], Dr [name]  . Trans Urethral Microwave Therapy       Current Outpatient Medications  Medication Sig Dispense Refill  . finasteride (PROSCAR) 5 MG tablet Take 1 tablet (5 mg total) by mouth daily. 90 tablet 3  . fluticasone (FLONASE) 50 MCG/ACT nasal spray     . Melatonin 1 MG TABS Take 1 mg by mouth at bedtime as needed.    . naproxen sodium (ALEVE) 220 MG tablet Take 220 mg by mouth daily as needed.    . rosuvastatin (CRESTOR) 10 MG tablet TAKE ONE TABLET BY MOUTH DAILY 90 tablet 4   No current facility-administered medications for this visit.     Allergies:   Patient has no known allergies.    Social History:  The patient  reports that he has never smoked. He has never used smokeless tobacco. He reports that he drinks about 3.0 standard drinks of alcohol per week. He reports that he does not use drugs.   Family History:  The patient's family history includes Diabetes  in his father; Heart disease in his father; Prostate cancer in his maternal uncle.    ROS:  Please see the history of present illness.   Otherwise, review of systems are positive for none.   All other systems are reviewed and negative.    PHYSICAL EXAM: VS:  BP 124/78 (BP Location: Right Arm, Patient Position: Sitting, Cuff Size: Normal)   Ht 5\' 9"  (1.753 m)   Wt 144 lb 8 oz (65.5 kg)   BMI 21.34 kg/m  , BMI Body mass index is 21.34 kg/m. GEN: Well nourished, well developed, in no acute distress  HEENT: normal  Neck: no JVD, carotid bruits, or masses Cardiac: RRR; no murmurs, rubs, or gallops,no edema  Respiratory:  clear to auscultation bilaterally, normal work of breathing GI: soft, nontender, nondistended, + BS MS: no deformity or atrophy  Skin: warm and  dry, no rash Neuro:  Strength and sensation are intact Psych: euthymic mood, full affect   EKG:  EKG is ordered today. The ekg ordered today demonstrates normal sinus rhythm with possible left atrial enlargement.  No significant ST or T wave changes.  No evidence of heart block.  Normal QT interval.   Recent Labs: 03/22/2018: ALT 27 07/31/2018: BUN 19; Creatinine, Ser 0.98; Hemoglobin 16.3; Magnesium 2.2; Platelets 251; Potassium 4.3; Sodium 141; TSH 5.667    Lipid Panel    Component Value Date/Time   CHOL 155 03/22/2018 0758   CHOL 183 08/22/2016 0815   TRIG 69.0 03/22/2018 0758   HDL 68.30 03/22/2018 0758   HDL 80 08/22/2016 0815   CHOLHDL 2 03/22/2018 0758   VLDL 13.8 03/22/2018 0758   LDLCALC 73 03/22/2018 0758   LDLCALC 81 08/22/2016 0815   LDLDIRECT 120.8 12/29/2013 0931      Wt Readings from Last 3 Encounters:  08/06/18 144 lb 8 oz (65.5 kg)  07/31/18 144 lb (65.3 kg)  04/24/18 147 lb (66.7 kg)      PAD Screen 08/06/2018  Previous PAD dx? No  Previous surgical procedure? No  Pain with walking? No  Feet/toe relief with dangling? No  Painful, non-healing ulcers? No  Extremities discolored? No      ASSESSMENT AND PLAN:  1.  Recent syncope: Certainly could be vasovagal but the lack of preceding symptoms in the sudden onset is worrisome for possible arrhythmia.  Due to that, I recommend a 2-week outpatient monitor.  I also think we have to exclude structural heart disease and I requested an echocardiogram. He had no features suggestive of seizures but he might require further work-up if his cardiac work-up is unremarkable. I advised him not to drive until we get further work-up on him. The patient is not orthostatic today.  He is very active and exercises regularly with no exertional symptoms.  Thus, doubt significant underlying ischemic heart disease.  2.  Hyperlipidemia: Currently on rosuvastatin.    Disposition:   FU with me as needed  Signed,  Kathlyn Sacramento, MD  08/06/2018 2:55 PM    [place]

## 2018-08-06 NOTE — Patient Instructions (Signed)
Medication Instructions: Your physician recommends that you continue on your current medications as directed. Please refer to the Current Medication list given to you today.  If you need a refill on your cardiac medications before your next appointment, please call your pharmacy.   Procedures/Testing: Your physician has requested that you have an echocardiogram. Echocardiography is a painless test that uses sound waves to create images of your heart. It provides your doctor with information about the size and shape of your heart and how well your heart's chambers and valves are working. You may receive an ultrasound enhancing agent through an IV if needed to better visualize your heart during the echo.This procedure takes approximately one hour. There are no restrictions for this procedure. This will take place at the [facility] clinic.   Your physician has recommended that you wear a 14 day Zio event monitor. Event monitors are medical devices that record the heart's electrical activity. Doctors most often [place] these monitors to diagnose arrhythmias. Arrhythmias are problems with the speed or rhythm of the heartbeat. The monitor is a small, portable device. You can wear one while you do your normal daily activities. This is usually used to diagnose what is causing palpitations/syncope (passing out).  Thank you for choosing [facility] at [facility]!

## 2018-08-10 ENCOUNTER — Ambulatory Visit: Payer: Self-pay | Admitting: Family Medicine

## 2018-08-12 ENCOUNTER — Ambulatory Visit (INDEPENDENT_AMBULATORY_CARE_PROVIDER_SITE_OTHER): Payer: Medicare Other | Admitting: Family Medicine

## 2018-08-12 ENCOUNTER — Encounter: Payer: Self-pay | Admitting: Family Medicine

## 2018-08-12 VITALS — BP 118/78 | HR 59 | Temp 98.5°F | Ht 69.0 in | Wt 146.4 lb

## 2018-08-12 DIAGNOSIS — S0101XA Laceration without foreign body of scalp, initial encounter: Secondary | ICD-10-CM

## 2018-08-12 DIAGNOSIS — G44309 Post-traumatic headache, unspecified, not intractable: Secondary | ICD-10-CM | POA: Diagnosis not present

## 2018-08-12 DIAGNOSIS — R55 Syncope and collapse: Secondary | ICD-10-CM | POA: Diagnosis not present

## 2018-08-12 DIAGNOSIS — Z4802 Encounter for removal of sutures: Secondary | ICD-10-CM | POA: Diagnosis not present

## 2018-08-12 NOTE — Progress Notes (Signed)
Subjective:    Patient ID: Johnny Munoz, male    DOB: 1951/09/26, 67 y.o.   MRN: [id number]  HPI   Patient presents to clinic for ER follow-up and staple removal from left side of head done 07/31/18.  Patient states he fainted and hit his head off unknown object.  3 staples were placed.  While hooked to telemetry monitoring in emergency department, it was noted patient did have PVCs.  It was recommended he follow-up with cardiology.  Patient has seen Dr. Lattie Haw, currently is wearing Holter monitor for monitoring.  Patient is unsure as to why he fainted, had been taking melatonin 3 mg at night to help sleep, but stated he would feel hung over the next day and very loopy.  Speculates melatonin could have been contributory to his fall.  Patient has stopped taking melatonin and has noticed he feels much better, no longer feeling loopy.  Also reports a mild headache across front part of head, has not taken many medications to help headache pain due to concerns of how they would affect him.   CT scan done of the brain in the ER reviewed by me which showed no acute intracranial abnormality and no skull fracture.  Patient's EKG revealed sinus bradycardia, but was otherwise normal.  Electrolyte levels stable, complete blood count normal, troponin negative, TSH slightly abnormal at 5.6.  CBC Latest Ref Rng & Units 07/31/2018 08/25/2017 08/27/2016  WBC 3.8 - 10.6 K/uL 5.7 5.6 6.4  Hemoglobin 13.0 - 18.0 g/dL 16.3 16.1 15.8  Hematocrit 40.0 - 52.0 % 47.3 48.6 46  Platelets 150 - 440 K/uL 251 277.0 255   BMP Latest Ref Rng & Units 07/31/2018 03/22/2018 09/23/2017  Glucose 70 - 99 mg/dL 127(H) 107(H) 92  BUN 8 - 23 mg/dL 19 21 17   Creatinine 0.61 - 1.24 mg/dL 0.98 1.02 0.98  BUN/Creat Ratio 10 - 24 - - -  Sodium 135 - 145 mmol/L 141 140 139  Potassium 3.5 - 5.1 mmol/L 4.3 4.5 4.0  Chloride 98 - 111 mmol/L 103 104 102  CO2 22 - 32 mmol/L 31 28 29   Calcium 8.9 - 10.3 mg/dL 9.3 9.5 9.4   Lab  Results  Component Value Date   TSH 5.667 (H) 07/31/2018      Patient Active Problem List   Diagnosis Date Noted  . Infected sebaceous cyst 08/26/2017  . Insomnia 08/21/2016  . Benign essential tremor 08/21/2016  . History of elevated PSA 12/15/2015  . Welcome to Medicare preventive visit 06/28/2013  . Osteoarthritis 06/14/2012  . Hyperlipidemia, unspecified 06/14/2012  . Enlarged prostate with lower urinary tract symptoms (LUTS) 06/14/2012   Social History   Tobacco Use  . Smoking status: Never Smoker  . Smokeless tobacco: Never Used  Substance Use Topics  . Alcohol use: Yes    Alcohol/week: 3.0 standard drinks    Types: 3 drink(s) per week   Review of Systems  Constitutional: Negative for chills, fatigue and fever.  HENT: Negative for congestion, ear pain, sinus pain and sore throat.   Eyes: Negative.   Respiratory: Negative for cough, shortness of breath and wheezing.   Cardiovascular: Negative for chest pain, palpitations and leg swelling.  Gastrointestinal: Negative for abdominal pain, diarrhea, nausea and vomiting.  Genitourinary: Negative for dysuria, frequency and urgency.  Musculoskeletal: Negative for arthralgias and myalgias.  Skin: Negative for color change, pallor and rash.  Neurological: Syncope episode x1. Mild frontal headache, ?sinus related and/or related to fall.  Psychiatric/Behavioral: The patient  is not nervous/anxious.       Objective:   Physical Exam  Constitutional: He is oriented to person, place, and time. He appears well-developed and well-nourished. No distress.  HENT:  Head: Normocephalic.  Small laceration left side of head consistent with story of fall.  3 staples were placed by ER.  Gauze soaked with sterile normal saline placed on top of scab/staples to help soften skin.  Staples removed successfully with staple removal tool.  Scab skin easily fell off.  Laceration is healed very well, no signs of infection.  Thin layer of bacitracin  placed over skin.  Eyes: EOM are normal. No scleral icterus.  Neck: Neck supple. No tracheal deviation present.  Cardiovascular: Regular rhythm.  HR 59 Monitor in place on chest  Pulmonary/Chest: Effort normal. No respiratory distress.  Neurological: He is alert and oriented to person, place, and time. No cranial nerve deficit. Coordination normal.  Skin: Skin is warm and dry. No pallor.  Psychiatric: He has a normal mood and affect. His behavior is normal. Thought content normal.  Nursing note and vitals reviewed.     Vitals:   08/12/18 0829  BP: 118/78  Pulse: (!) 59  Temp: 98.5 F (36.9 C)  SpO2: 95%    Assessment & Plan:   Syncope and collapse --patient fainted for unknown reason.  Currently being worked up by cardiology looking for cause of fall.  Patient has stopped melatonin as this medication made him feel loopy, advised this was a good plan.  Head laceration/staple removal- staple successfully removed, laceration healed very well.  Patient advised to keep area clean and dry, apply thin layer of bacitracin over healed left for next 3 days then can stop.  Headache- headache could be related to fall, it is mild in nature.  Patient advised to use Tylenol for headache pain.  Advised to take at thousand milligrams of Tylenol at one time and also can place ice pack over her head to help soothe headache pain.  Patient and wife also advised to take no more than 3000 mg of Tylenol in a 24-hour period.  If you develop severe head pain, neurological symptoms such as speech changes,  vision changes weakness in extremities, vomiting, worsening dizziness or weakness call office or go to emergency room right away.  Keep regular follow-up as already scheduled.  Return to clinic sooner if any issues arise.

## 2018-08-12 NOTE — Patient Instructions (Signed)
Laceration on head has healed well!  Wash skin with soap and water, dry, apply thin layer of bacitracin or antibiotic ointment once day for next 3 days then you can stop.

## 2018-08-20 DIAGNOSIS — R55 Syncope and collapse: Secondary | ICD-10-CM | POA: Diagnosis not present

## 2018-08-23 ENCOUNTER — Ambulatory Visit (INDEPENDENT_AMBULATORY_CARE_PROVIDER_SITE_OTHER): Payer: Medicare Other

## 2018-08-23 DIAGNOSIS — R55 Syncope and collapse: Secondary | ICD-10-CM | POA: Diagnosis not present

## 2018-08-24 DIAGNOSIS — R55 Syncope and collapse: Secondary | ICD-10-CM | POA: Diagnosis not present

## 2018-08-26 ENCOUNTER — Telehealth: Payer: Self-pay | Admitting: *Deleted

## 2018-08-26 ENCOUNTER — Encounter: Payer: Self-pay | Admitting: Internal Medicine

## 2018-08-26 DIAGNOSIS — I493 Ventricular premature depolarization: Secondary | ICD-10-CM

## 2018-08-26 NOTE — Telephone Encounter (Signed)
Patient returning call.

## 2018-08-26 NOTE — Telephone Encounter (Signed)
Left a message for the patient to call back.  

## 2018-08-26 NOTE — Telephone Encounter (Signed)
-----   Message from Wellington Hampshire, MD sent at 08/26/2018  2:15 PM EDT ----- Inform patient that monitor showed mild abnormalities that do not explain his syncopal episode.  However, he was noted to have frequent PVCs and due to that I recommend evaluation with a treadmill Myoview.

## 2018-08-27 NOTE — Telephone Encounter (Signed)
Patient made aware of results and verbalized understanding.  Patient has agreed to a treadmill myoview. Order has been placed and scheduling notified.

## 2018-08-27 NOTE — Telephone Encounter (Signed)
lmov to schedule  °

## 2018-08-27 NOTE — Telephone Encounter (Signed)
Patient scheduled myoview for 10/15

## 2018-09-14 ENCOUNTER — Ambulatory Visit
Admission: RE | Admit: 2018-09-14 | Discharge: 2018-09-14 | Disposition: A | Discharge status: Home / Self Care | Payer: Medicare Other | Source: Ambulatory Visit | Attending: Cardiovascular Disease | Admitting: Cardiovascular Disease

## 2018-09-14 DIAGNOSIS — I493 Ventricular premature depolarization: Secondary | ICD-10-CM

## 2018-09-14 LAB — NM MYOCAR MULTI W/SPECT W/WALL MOTION / EF
CHL CUP MPHR: 154 {beats}/min
CHL CUP RESTING HR STRESS: 58 {beats}/min
CSEPHR: 106 %
CSEPPHR: 164 {beats}/min
Estimated workload: 10.6 METS
Exercise duration (min): 9 min
Exercise duration (sec): 1 s
LVDIAVOL: 43 mL (ref 62–150)
LVSYSVOL: 12 mL
NUC STRESS TID: 0.9

## 2018-09-14 MED ORDER — TECHNETIUM TC 99M TETROFOSMIN IV KIT
11.4120 | PACK | Freq: Once | INTRAVENOUS | Status: AC | PRN
  Administered 2018-09-14: 11.412 via INTRAVENOUS

## 2018-09-14 MED ORDER — TECHNETIUM TC 99M TETROFOSMIN IV KIT
29.8130 | PACK | Freq: Once | INTRAVENOUS | Status: AC | PRN
  Administered 2018-09-14: 29.813 via INTRAVENOUS

## 2018-09-15 ENCOUNTER — Encounter: Payer: Self-pay | Admitting: Podiatry

## 2018-09-15 ENCOUNTER — Ambulatory Visit (INDEPENDENT_AMBULATORY_CARE_PROVIDER_SITE_OTHER): Payer: Medicare Other | Admitting: Podiatry

## 2018-09-15 DIAGNOSIS — G5762 Lesion of plantar nerve, left lower limb: Secondary | ICD-10-CM

## 2018-09-15 NOTE — Progress Notes (Signed)
He presents today after having not seen him since last November with a chief complaint of pain in between the second and third toes of the left foot.  He states it feels like a neuroma that we had before.  Objective: Vital signs are stable alert and oriented x3.  Pulses are palpable.  Pain on palpation second interdigital space and third interspace of the left foot.  Palpable Mulder's click is present.  Consistent with neuroma.  Assessment: Neuroma second interdigital space and to lesser degree third interdigital space.  Plan: Injected 10 mg Kenalog 5 mg Marcaine point maximal tenderness second interdigital space of the left foot after sterile Betadine skin prep.  Tolerated procedure well without complications follow-up PRN.

## 2018-09-17 ENCOUNTER — Encounter: Payer: Self-pay | Admitting: Internal Medicine

## 2018-09-17 ENCOUNTER — Ambulatory Visit (INDEPENDENT_AMBULATORY_CARE_PROVIDER_SITE_OTHER): Payer: Medicare Other | Admitting: Internal Medicine

## 2018-09-17 ENCOUNTER — Ambulatory Visit (INDEPENDENT_AMBULATORY_CARE_PROVIDER_SITE_OTHER): Payer: Medicare Other

## 2018-09-17 ENCOUNTER — Ambulatory Visit: Payer: Self-pay

## 2018-09-17 VITALS — BP 114/74 | HR 59 | Temp 98.9°F | Resp 14 | Ht 69.0 in | Wt 141.4 lb

## 2018-09-17 DIAGNOSIS — R001 Bradycardia, unspecified: Secondary | ICD-10-CM

## 2018-09-17 DIAGNOSIS — R7301 Impaired fasting glucose: Secondary | ICD-10-CM | POA: Diagnosis not present

## 2018-09-17 DIAGNOSIS — R7989 Other specified abnormal findings of blood chemistry: Secondary | ICD-10-CM | POA: Diagnosis not present

## 2018-09-17 DIAGNOSIS — M150 Primary generalized (osteo)arthritis: Secondary | ICD-10-CM

## 2018-09-17 DIAGNOSIS — R3911 Hesitancy of micturition: Secondary | ICD-10-CM

## 2018-09-17 DIAGNOSIS — R5383 Other fatigue: Secondary | ICD-10-CM | POA: Diagnosis not present

## 2018-09-17 DIAGNOSIS — E785 Hyperlipidemia, unspecified: Secondary | ICD-10-CM | POA: Diagnosis not present

## 2018-09-17 DIAGNOSIS — R634 Abnormal weight loss: Secondary | ICD-10-CM

## 2018-09-17 DIAGNOSIS — N401 Enlarged prostate with lower urinary tract symptoms: Secondary | ICD-10-CM

## 2018-09-17 DIAGNOSIS — Z23 Encounter for immunization: Secondary | ICD-10-CM | POA: Diagnosis not present

## 2018-09-17 DIAGNOSIS — Z0000 Encounter for general adult medical examination without abnormal findings: Secondary | ICD-10-CM | POA: Diagnosis not present

## 2018-09-17 DIAGNOSIS — Z87898 Personal history of other specified conditions: Secondary | ICD-10-CM | POA: Diagnosis not present

## 2018-09-17 DIAGNOSIS — G250 Essential tremor: Secondary | ICD-10-CM | POA: Diagnosis not present

## 2018-09-17 DIAGNOSIS — M159 Polyosteoarthritis, unspecified: Secondary | ICD-10-CM

## 2018-09-17 MED ORDER — PREDNISONE 10 MG PO TABS: ORAL_TABLET | ORAL | 0 refills | Status: DC

## 2018-09-17 MED ORDER — AMOXICILLIN-POT CLAVULANATE 875-125 MG PO TABS: 1.0000 | ORAL_TABLET | Freq: Two times a day (BID) | ORAL | 0 refills | Status: DC

## 2018-09-17 MED ORDER — FLUTICASONE PROPIONATE 50 MCG/ACT NA SUSP: 2.0000 | Freq: Every day | NASAL | 6 refills | Status: AC

## 2018-09-17 NOTE — Patient Instructions (Addendum)
Buy yourself a pulse oximeter at [employer]  And check pulse before you get out of bed  I agree that Sudafed   Or sudafed PE  before flying to keep ears /sinuses  Happy.  PROTECT YOURSELF ON THE FLIGHT WITH A MASK IF INDICATED AND IRRIGATE SINUSES AFTER YOU LAND   Repeat thyroid and sugar labs today   If your thyroid is underactive,  I will prescribed levothyroxine (artificial thyroid)  Take the augmentin and prednisone with you to [place], Male A healthy lifestyle and preventive care is important for your health and wellness. Ask your health care provider about what schedule of regular examinations is right for you. What should I know about weight and diet? Eat a Healthy Diet  Eat plenty of vegetables, fruits, whole grains, low-fat dairy products, and lean protein.  Do not eat a lot of foods high in solid fats, added sugars, or salt.  Maintain a Healthy Weight Regular exercise can help you achieve or maintain a healthy weight. You should:  Do at least 150 minutes of exercise each week. The exercise should increase your heart rate and make you sweat (moderate-intensity exercise).  Do strength-training exercises at least twice a week.  Watch Your Levels of Cholesterol and Blood Lipids  Have your blood tested for lipids and cholesterol every 5 years starting at 67 years of age. If you are at high risk for heart disease, you should start having your blood tested when you are 67 years old. You may need to have your cholesterol levels checked more often if: ? Your lipid or cholesterol levels are high. ? You are older than 67 years of age. ? You are at high risk for heart disease.  What should I know about cancer screening? Many types of cancers can be detected early and may often be prevented. Lung Cancer  You should be screened every year for lung cancer if: ? You are a current smoker who has smoked for at least 30 years. ? You are a former smoker who has quit  within the past 15 years.  Talk to your health care provider about your screening options, when you should start screening, and how often you should be screened.  Colorectal Cancer  Routine colorectal cancer screening usually begins at 67 years of age and should be repeated every 5-10 years until you are 67 years old. You may need to be screened more often if early forms of precancerous polyps or small growths are found. Your health care provider may recommend screening at an earlier age if you have risk factors for colon cancer.  Your health care provider may recommend using home test kits to check for hidden blood in the stool.  A small camera at the end of a tube can be used to examine your colon (sigmoidoscopy or colonoscopy). This checks for the earliest forms of colorectal cancer.  Prostate and Testicular Cancer  Depending on your age and overall health, your health care provider may do certain tests to screen for prostate and testicular cancer.  Talk to your health care provider about any symptoms or concerns you have about testicular or prostate cancer.  Skin Cancer  Check your skin from head to toe regularly.  Tell your health care provider about any new moles or changes in moles, especially if: ? There is a change in a mole's size, shape, or color. ? You have a mole that is larger than a pencil eraser.  Always use  sunscreen. Apply sunscreen liberally and repeat throughout the day.  Protect yourself by wearing long sleeves, pants, a wide-brimmed hat, and sunglasses when outside.  What should I know about heart disease, diabetes, and high blood pressure?  If you are 53-67 years of age, have your blood pressure checked every 3-5 years. If you are 72 years of age or older, have your blood pressure checked every year. You should have your blood pressure measured twice-once when you are at a hospital or clinic, and once when you are not at a hospital or clinic. Record the average  of the two measurements. To check your blood pressure when you are not at a hospital or clinic, you can use: ? An automated blood pressure machine at a pharmacy. ? A home blood pressure monitor.  Talk to your health care provider about your target blood pressure.  If you are between 9-66 years old, ask your health care provider if you should take aspirin to prevent heart disease.  Have regular diabetes screenings by checking your fasting blood sugar level. ? If you are at a normal weight and have a low risk for diabetes, have this test once every three years after the age of 15. ? If you are overweight and have a high risk for diabetes, consider being tested at a younger age or more often.  A one-time screening for abdominal aortic aneurysm (AAA) by ultrasound is recommended for men aged 23-75 years who are current or former smokers. What should I know about preventing infection? Hepatitis B If you have a higher risk for hepatitis B, you should be screened for this virus. Talk with your health care provider to find out if you are at risk for hepatitis B infection. Hepatitis C Blood testing is recommended for:  Everyone born from [birth year] through 1965.  Anyone with known risk factors for hepatitis C.  Sexually Transmitted Diseases (STDs)  You should be screened each year for STDs including gonorrhea and chlamydia if: ? You are sexually active and are younger than 67 years of age. ? You are older than 67 years of age and your health care provider tells you that you are at risk for this type of infection. ? Your sexual activity has changed since you were last screened and you are at an increased risk for chlamydia or gonorrhea. Ask your health care provider if you are at risk.  Talk with your health care provider about whether you are at high risk of being infected with HIV. Your health care provider may recommend a prescription medicine to help prevent HIV infection.  What else can I  do?  Schedule regular health, dental, and eye exams.  Stay current with your vaccines (immunizations).  Do not use any tobacco products, such as cigarettes, chewing tobacco, and e-cigarettes. If you need help quitting, ask your health care provider.  Limit alcohol intake to no more than 2 drinks per day. One drink equals 12 ounces of beer, 5 ounces of wine, or 1 ounces of hard liquor.  Do not use street drugs.  Do not share needles.  Ask your health care provider for help if you need support or information about quitting drugs.  Tell your health care provider if you often feel depressed.  Tell your health care provider if you have ever been abused or do not feel safe at home. This information is not intended to replace advice given to you by your health care provider. Make sure you discuss any questions you  have with your health care provider. Document Released: 05/15/2008 Document Revised: 07/16/2016 Document Reviewed: 08/21/2015 Elsevier Interactive Patient Education  [employer].

## 2018-09-17 NOTE — Progress Notes (Signed)
Patient ID: Johnny Munoz, male    DOB: 07-Sep-1951  Age: 67 y.o. MRN: [id number]  The patient is here for  Follow up and management of other chronic and acute problems.  Sees [facility] annually Eye exam  next week Colon cancer screening up to date Sees Urology for PSA/BPH     The risk factors are reflected in the social history.  The roster of all physicians providing medical care to patient - is listed in the Snapshot section of the chart.  Activities of daily living:  The patient is 100% independent in all ADLs: dressing, toileting, feeding as well as independent mobility  Home safety : The patient has smoke detectors in the home. They wear seatbelts.  There are no firearms at home. There is no violence in the home.   There is no risks for hepatitis, STDs or HIV. There is no   history of blood transfusion. They have no travel history to infectious disease endemic areas of the world.  The patient has seen their dentist in the last six month. They have seen their eye doctor in the last year. They admit to slight hearing difficulty with regard to whispered voices and some television programs.  They have deferred audiologic testing in the last year.  They do not  have excessive sun exposure. Discussed the need for sun protection: hats, long sleeves and use of sunscreen if there is significant sun exposure.   Diet: the importance of a healthy diet is discussed. They do have a healthy diet.  The benefits of regular aerobic exercise were discussed. he walks 5 times per week ,  60 to 90 minutes.   Depression screen: there are no signs or vegative symptoms of depression- irritability, change in appetite, anhedonia, sadness/tearfullness.  Cognitive assessment: the patient manages all their financial and personal affairs and is actively engaged. They could relate day,date,year and events; recalled 2/3 objects at 3 minutes; performed clock-face test normally.  The following portions of the  patient's history were reviewed and updated as appropriate: allergies, current medications, past family history, past medical history,  past surgical history, past social history  and problem list.  Visual acuity was not assessed per patient preference since she has regular follow up with her ophthalmologist. Hearing and body mass index were assessed and reviewed.   During the course of the visit the patient was educated and counseled about appropriate screening and preventive services including : fall prevention , diabetes screening, nutrition counseling, colorectal cancer screening, and recommended immunizations.    CC: The primary encounter diagnosis was Fatigue, unspecified type. Diagnoses of Weight loss, Impaired fasting glucose, Need for immunization against influenza, Benign essential tremor, Primary osteoarthritis involving multiple joints, Abnormal thyroid blood test, Bradycardia with 41-50 beats per minute, History of elevated PSA, Hyperlipidemia, unspecified hyperlipidemia type, Benign prostatic hyperplasia with urinary hesitancy, and History of syncope were also pertinent to this visit.  1) syncopal episode resulting in blunt head trauma on August 31,  treated in ED for scalp laceration with staples, .   labs reviewed,  TSH was elevated and t4 low.  A head CT noted an old lacunar infarct in the right caudate nucleus.  EKG sinus  brady at a rate 46,  And  He was referred to [place] .  Repeat ekg rate 61  arrhythmia suspected ,  A  2 week outpatient heart monitor was ordered and myoview done    He has not had any subsequent events     History:  Johnny Munoz has a past medical history of Allergy, Arthritis, BPH (benign prostatic hyperplasia), and Hyperlipidemia.   He has a past surgical history that includes Trans Urethral Microwave Therapy and Colonoscopy (2003, 10/12/13).   His family history includes Diabetes in his father; Heart disease in his father; Prostate cancer in his maternal uncle.He  reports that he has never smoked. He has never used smokeless tobacco. He reports that he drinks about 3.0 standard drinks of alcohol per week. He reports that he does not use drugs.  Outpatient Medications Prior to Visit  Medication Sig Dispense Refill  . acetaminophen (TYLENOL) 500 MG tablet Take 500 mg by mouth every 6 (six) hours as needed.    . finasteride (PROSCAR) 5 MG tablet Take 1 tablet (5 mg total) by mouth daily. 90 tablet 3  . rosuvastatin (CRESTOR) 10 MG tablet TAKE ONE TABLET BY MOUTH DAILY 90 tablet 4  . naproxen sodium (ALEVE) 220 MG tablet Take 220 mg by mouth daily as needed.     No facility-administered medications prior to visit.     Review of Systems   Patient denies headache, fevers, malaise, unintentional weight loss, skin rash, eye pain, sinus congestion and sinus pain, sore throat, dysphagia,  hemoptysis , cough, dyspnea, wheezing, chest pain, palpitations, orthopnea, edema, abdominal pain, nausea, melena, diarrhea, constipation, flank pain, dysuria, hematuria, urinary  Frequency, nocturia, numbness, tingling, seizures,  Focal weakness, Loss of consciousness,  Tremor, insomnia, depression, anxiety, and suicidal ideation.      Objective:  BP 114/74 (BP Location: Left Arm, Patient Position: Sitting, Cuff Size: Normal)   Pulse (!) 59   Temp 98.9 F (37.2 C) (Oral)   Resp 14   Ht 5\' 9"  (1.753 m)   Wt 141 lb 6.4 oz (64.1 kg)   SpO2 98%   BMI 20.88 kg/m   Physical Exam   General appearance: alert, cooperative and appears stated age Ears: normal TM's and external ear canals both ears Throat: lips, mucosa, and tongue normal; teeth and gums normal Neck: no adenopathy, no carotid bruit, supple, symmetrical, trachea midline and thyroid not enlarged, symmetric, no tenderness/mass/nodules Back: symmetric, no curvature. ROM normal. No CVA tenderness. Lungs: clear to auscultation bilaterally Heart: regular rate and rhythm, S1, S2 normal, no murmur, click, rub or  gallop Abdomen: soft, non-tender; bowel sounds normal; no masses,  no organomegaly Pulses: 2+ and symmetric Skin: Skin color, texture, turgor normal. No rashes or lesions Lymph nodes: Cervical, supraclavicular, and axillary nodes normal.    Assessment & Plan:   Problem List Items Addressed This Visit    Abnormal thyroid blood test    Suggested by ER labs done after a syncopal event accompanied by bradycardia.  Normal upon repeat today.  Lab Results  Component Value Date   TSH 0.81 09/17/2018         Benign essential tremor    Non progressive,  Not interfering with his ability to play the piano/organ      Bradycardia with 41-50 beats per minute    His baseline  Heart rate is on the low side but he has not been symptomatic until his recent syncopal event.  Recommended that he purchase a pulse oximeter  To periodically assess pulse upon waking       Enlarged prostate with lower urinary tract symptoms (LUTS)    Managed with proscar, which may have caused the syncopal event due to orthostasis.  If syncope recurs with no evidence of arrhythmia will recommend that he suspend proscar  History of elevated PSA    Accompanied by BPH,  Managed by urology  Johnny Munoz   Lab Results  Component Value Date   PSA 1.0 08/27/2016   PSA 0.75 03/06/2016   PSA 2.11 06/28/2013         History of syncope    Attributed to bradycardia but may have been orthostasis given use of Proscar.       Hyperlipidemia, unspecified    He has resumed rosuvastatin  After a trial of  a vinegar tonic resulted in elevation in risk to 16% LDL and triglycerides are at goal on current medications. He has no side effects. No changes today  Lab Results  Component Value Date   CHOL 155 03/22/2018   HDL 68.30 03/22/2018   LDLCALC 73 03/22/2018   LDLDIRECT 120.8 12/29/2013   TRIG 69.0 03/22/2018   CHOLHDL 2 03/22/2018   Lab Results  Component Value Date   ALT 27 03/22/2018   AST 23 03/22/2018    GGT 15 08/22/2016   ALKPHOS 51 03/22/2018   BILITOT 0.4 03/22/2018          Osteoarthritis    Affecting DIPs.  managed with twice weekly aleve .  Recommended trial of fish oil.       Relevant Medications   acetaminophen (TYLENOL) 500 MG tablet   predniSONE (DELTASONE) 10 MG tablet    Other Visit Diagnoses    Fatigue, unspecified type    -  Primary   Weight loss       Relevant Orders   TSH (Completed)   T4, free (Completed)   Impaired fasting glucose       Relevant Orders   Hemoglobin A1c (Completed)   Need for immunization against influenza       Relevant Orders   Flu Vaccine QUAD 36+ mos IM (Completed)      I have discontinued Johnny Munoz's naproxen sodium. I am also having him start on fluticasone, amoxicillin-clavulanate, and predniSONE. Additionally, I am having him maintain his finasteride, rosuvastatin, and acetaminophen.  Meds ordered this encounter  Medications  . fluticasone (FLONASE) 50 MCG/ACT nasal spray    Sig: Place 2 sprays into both nostrils daily.    Dispense:  16 g    Refill:  6  . amoxicillin-clavulanate (AUGMENTIN) 875-125 MG tablet    Sig: Take 1 tablet by mouth 2 (two) times daily.    Dispense:  14 tablet    Refill:  0  . predniSONE (DELTASONE) 10 MG tablet    Sig: 6 tablets on Day 1 , then reduce by 1 tablet daily until gone    Dispense:  21 tablet    Refill:  0  A total of 40 minutes was spent with patient more than half of which was spent in counseling patient on the above mentioned issues , reviewing and explaining recent labs and imaging studies done, and coordination of care.   Medications Discontinued During This Encounter  Medication Reason  . naproxen sodium (ALEVE) 220 MG tablet Patient Preference    Follow-up: Return in about 1 year (around 09/18/2019).   [name], MD

## 2018-09-17 NOTE — Progress Notes (Signed)
Subjective:   Johnny Munoz is a 67 y.o. male who presents for an Initial Medicare Annual Wellness Visit.  Review of Systems  No ROS.  Medicare Wellness Visit. Additional risk factors are reflected in the social history. Cardiac Risk Factors include: advanced age (>46men, >7 women);male gender    Objective:    Today's Vitals   09/17/18 1531  BP: 114/74  Pulse: (!) 59  Resp: 14  Temp: 98.9 F (37.2 C)  TempSrc: Oral  SpO2: 98%  Weight: 141 lb 6.4 oz (64.1 kg)  Height: 5\' 9"  (1.753 m)   Body mass index is 20.88 kg/m.  Advanced Directives 09/17/2018 07/31/2018  Does Patient Have a Medical Advance Directive? Yes Yes  Type of Paramedic of [place];Living will [place]  Does patient want to make changes to medical advance directive? No - Patient declined No - Patient declined  Copy of [place] in Chart? No - copy requested No - copy requested  Would patient like information on creating a medical advance directive? - No - Patient declined    Current Medications (verified) Outpatient Encounter Medications as of 09/17/2018  Medication Sig  . finasteride (PROSCAR) 5 MG tablet Take 1 tablet (5 mg total) by mouth daily.  . fluticasone (FLONASE) 50 MCG/ACT nasal spray Place 2 sprays into both nostrils daily.  . rosuvastatin (CRESTOR) 10 MG tablet TAKE ONE TABLET BY MOUTH DAILY   No facility-administered encounter medications on file as of 09/17/2018.     Allergies (verified) Patient has no known allergies.   History: Past Medical History:  Diagnosis Date  . Allergy   . Arthritis   . BPH (benign prostatic hyperplasia)   . Hyperlipidemia    Past Surgical History:  Procedure Laterality Date  . COLONOSCOPY  2003, 10/12/13   Dr. [name], Dr [name]  . Trans Urethral Microwave Therapy     Family History  Problem Relation Age of Onset  . Diabetes Father   . Heart disease Father        CABG after 67   . Prostate cancer Maternal Uncle   . Kidney cancer Neg Hx   . Bladder Cancer Neg Hx    Social History   Socioeconomic History  . Marital status: Married    Spouse name: Not on file  . Number of children: Not on file  . Years of education: 27  . Highest education level: Not on file  Occupational History  . Occupation: Research scientist (physical sciences): [employer]  . Occupation: Retired  Scientific laboratory technician  . Financial resource strain: Not hard at all  . Food insecurity:    Worry: Never true    Inability: Never true  . Transportation needs:    Medical: No    Non-medical: No  Tobacco Use  . Smoking status: Never Smoker  . Smokeless tobacco: Never Used  Substance and Sexual Activity  . Alcohol use: Yes    Alcohol/week: 3.0 standard drinks    Types: 3 drink(s) per week  . Drug use: No  . Sexual activity: Not on file  Lifestyle  . Physical activity:    Days per week: 5 days    Minutes per session: 40 min  . Stress: Not at all  Relationships  . Social connections:    Talks on phone: Not on file    Gets together: More than three times a week    Attends religious service: More than 4 times per  year    Active member of club or organization: Yes    Attends meetings of clubs or organizations: More than 4 times per year    Relationship status: Married  Other Topics Concern  . Not on file  Social History Narrative   Regular exercise-yes   Caffeine Use-yes         Tobacco Counseling Counseling given: Not Answered   Clinical Intake:  Pre-visit preparation completed: Yes  Pain : No/denies pain     Nutritional Status: BMI of 19-24  Normal Diabetes: No  How often do you need to have someone help you when you read instructions, pamphlets, or other written materials from your doctor or pharmacy?: 1 - Never  Interpreter Needed?: No     Activities of Daily Living In your present state of health, do you have any difficulty performing the following activities:  09/17/2018  Hearing? N  Vision? N  Difficulty concentrating or making decisions? N  Walking or climbing stairs? N  Dressing or bathing? N  Doing errands, shopping? N  Preparing Food and eating ? N  Using the Toilet? N  In the past six months, have you accidently leaked urine? N  Do you have problems with loss of bowel control? N  Managing your Medications? N  Managing your Finances? N  Housekeeping or managing your Housekeeping? N  Some recent data might be hidden     Immunizations and Health Maintenance Immunization History  Administered Date(s) Administered  . Influenza, High Dose Seasonal PF 08/25/2017  . Influenza,inj,Quad PF,6+ Mos 08/11/2014, 09/14/2015, 09/17/2018  . Influenza-Unspecified 08/31/2012  . Pneumococcal Conjugate-13 08/25/2017  . Td 12/30/2007  . Tdap 07/31/2018  . Zoster 08/13/2015   Health Maintenance Due  Topic Date Due  . INFLUENZA VACCINE  07/01/2018  . PNA vac Low Risk Adult (2 of 2 - PPSV23) 08/25/2018    Patient Care Team: [name], MD as PCP - General (Internal Medicine) [name], Forest Gleason, MD (General Surgery) [name], MD (Internal Medicine) Christene Lye, MD (General Surgery) [name], MD (Internal Medicine)  Indicate any recent Medical Services you may have received from other than [facility] providers in the past year (date may be approximate).    Assessment:   This is a routine wellness examination for [name].  The goal of the wellness visit is to assist the patient how to close the gaps in care and create a preventative care plan for the patient.   Labs following as directed by pcp.  Osteoarthritis. Osteoporosis risk reviewed.    Safety issues reviewed.  No new identified risk were noted.  No failures at ADL's or IADL's.   Regular diet.  Adequate fluid intake.   Dental- every 6 months.  Eye- Visual acuity not assessed per patient preference. Eye exam scheduled in the upcoming week.   Pneumococcal  23 discussed. Deferred; received influenza vaccine earlier today.   Keep all routine maintenance appointments.   Hearing/Vision screen Hearing Screening Comments: Patient is able to hear conversational tones without difficulty.  No issues reported.   Vision Screening Comments: Follow[facility] Wears corrective lenses Last OV 08/2017 Visual acuity not assessed per patient preference, annual exam scheduled next week.   Dietary issues and exercise activities discussed: Current Exercise Habits: Home exercise routine, Type of exercise: walking, Time (Minutes): 45, Frequency (Times/Week): 5, Weekly Exercise (Minutes/Week): 225  Goals    . Maintain Healthy Lifestyle      Depression Screen PHQ 2/9 Scores  09/17/2018 08/25/2017  PHQ - 2 Score 0 0  PHQ- 9 Score 1 1    Fall Risk Fall Risk  08/25/2017  Falls in the past year? No   Cognitive Function: MMSE - Mini Mental State Exam 09/17/2018  Orientation to time 5  Orientation to Place 5  Registration 3  Attention/ Calculation 5  Recall 3  Language- name 2 objects 2  Language- repeat 1  Language- follow 3 step command 3  Language- read & follow direction 1  Write a sentence 1  Copy design 1  Total score 30        Screening Tests Health Maintenance  Topic Date Due  . INFLUENZA VACCINE  07/01/2018  . PNA vac Low Risk Adult (2 of 2 - PPSV23) 08/25/2018  . COLONOSCOPY  10/13/2023  . TETANUS/TDAP  07/31/2028  . Hepatitis C Screening  Completed      Plan:    End of life planning; Advance aging; Advanced directives discussed. Copy of current HCPOA/Living Will requested.    I have personally reviewed and noted the following in the patient's chart:   . Medical and social history . Use of alcohol, tobacco or illicit drugs  . Current medications and supplements . Functional ability and status . Nutritional status . Physical activity . Advanced directives . List of other physicians . Hospitalizations, surgeries,  and ER visits in previous 12 months . Vitals . Screenings to include cognitive, depression, and falls . Referrals and appointments  In addition, I have reviewed and discussed with patient certain preventive protocols, quality metrics, and best practice recommendations. A written personalized care plan for preventive services as well as general preventive health recommendations were provided to patient.     OBrien-Blaney, Rukaya Kleinschmidt L, LPN   [id number]     I have reviewed the above information and agree with above.   Deborra Medina, MD

## 2018-09-17 NOTE — Patient Instructions (Addendum)
  Mr. Ohman , Thank you for taking time to come for your Medicare Wellness Visit. I appreciate your ongoing commitment to your health goals. Please review the following plan we discussed and let me know if I can assist you in the future.   Follow up as needed.    Bring a copy of your [place] and/or Living Will to be scanned into chart.  Have a great day!  These are the goals we discussed: Goals    . Maintain Healthy Lifestyle       This is a list of the screening recommended for you and due dates:  Health Maintenance  Topic Date Due  . Flu Shot  07/01/2018  . Pneumonia vaccines (2 of 2 - PPSV23) 08/25/2018  . Colon Cancer Screening  10/13/2023  . Tetanus Vaccine  07/31/2028  .  Hepatitis C: One time screening is recommended by Center for Disease Control  (CDC) for  adults born from [birth year] through 1965.   Completed

## 2018-09-18 LAB — T4, FREE: FREE T4: 0.9 ng/dL (ref 0.8–1.8)

## 2018-09-18 LAB — HEMOGLOBIN A1C
HEMOGLOBIN A1C: 5.9 %{Hb} — AB (ref <5.7)
Mean Plasma Glucose: 123 (calc)
eAG (mmol/L): 6.8 (calc)

## 2018-09-18 LAB — TSH: TSH: 0.81 m[IU]/L (ref 0.40–4.50)

## 2018-09-19 DIAGNOSIS — Z87898 Personal history of other specified conditions: Secondary | ICD-10-CM | POA: Insufficient documentation

## 2018-09-19 DIAGNOSIS — R7989 Other specified abnormal findings of blood chemistry: Secondary | ICD-10-CM | POA: Insufficient documentation

## 2018-09-19 DIAGNOSIS — R001 Bradycardia, unspecified: Secondary | ICD-10-CM | POA: Insufficient documentation

## 2018-09-19 NOTE — Assessment & Plan Note (Signed)
Accompanied by BPH,  Managed by urology  Johnny Munoz   Lab Results  Component Value Date   PSA 1.0 08/27/2016   PSA 0.75 03/06/2016   PSA 2.11 06/28/2013

## 2018-09-19 NOTE — Assessment & Plan Note (Addendum)
Suggested by ER labs done after a syncopal event accompanied by bradycardia.  Normal upon repeat today.  Lab Results  Component Value Date   TSH 0.81 09/17/2018

## 2018-09-19 NOTE — Assessment & Plan Note (Signed)
Attributed to bradycardia but may have been orthostasis given use of Proscar.

## 2018-09-19 NOTE — Assessment & Plan Note (Signed)
Non progressive,  Not interfering with his ability to play the piano/organ

## 2018-09-19 NOTE — Assessment & Plan Note (Addendum)
Affecting DIPs.  managed with twice weekly aleve .  Recommended trial of fish oil.

## 2018-09-19 NOTE — Assessment & Plan Note (Signed)
He has resumed rosuvastatin  After a trial of  a vinegar tonic resulted in elevation in risk to 16% LDL and triglycerides are at goal on current medications. He has no side effects. No changes today  Lab Results  Component Value Date   CHOL 155 03/22/2018   HDL 68.30 03/22/2018   LDLCALC 73 03/22/2018   LDLDIRECT 120.8 12/29/2013   TRIG 69.0 03/22/2018   CHOLHDL 2 03/22/2018   Lab Results  Component Value Date   ALT 27 03/22/2018   AST 23 03/22/2018   GGT 15 08/22/2016   ALKPHOS 51 03/22/2018   BILITOT 0.4 03/22/2018

## 2018-09-19 NOTE — Assessment & Plan Note (Addendum)
Managed with proscar, which may have caused the syncopal event due to orthostasis.  If syncope recurs with no evidence of arrhythmia will recommend that he suspend proscar

## 2018-09-19 NOTE — Assessment & Plan Note (Signed)
His baseline  Heart rate is on the low side but he has not been symptomatic until his recent syncopal event.  Recommended that he purchase a pulse oximeter  To periodically assess pulse upon waking

## 2018-10-05 ENCOUNTER — Ambulatory Visit (INDEPENDENT_AMBULATORY_CARE_PROVIDER_SITE_OTHER): Payer: Medicare Other | Admitting: Podiatry

## 2018-10-05 DIAGNOSIS — G5782 Other specified mononeuropathies of left lower limb: Secondary | ICD-10-CM

## 2018-10-05 DIAGNOSIS — G5762 Lesion of plantar nerve, left lower limb: Secondary | ICD-10-CM

## 2018-10-05 NOTE — Progress Notes (Signed)
He presents today before he leaves for his trip to [place].  He is complaining of pain to the third interdigital space of the left foot.  Objective: Vital signs are stable he is alert and oriented x3 still has reactive hyperkeratotic lesion to the medial aspect of the third digit of the left foot which does not appear to be clinically infected but is still also has palpable Mulder's click to third interdigital space of the left foot.  Assessment: Neuroma third interspace left foot.  Plan: At this point I went ahead and injected Kenalog once again to hopefully alleviate his symptoms.  Discussed appropriate shoe gear wider shoe gear and placed padding to the interdigital spot.  I will follow-up with him as needed.

## 2018-10-11 ENCOUNTER — Ambulatory Visit: Payer: Medicare Other | Admitting: Podiatry

## 2018-11-08 DIAGNOSIS — L570 Actinic keratosis: Secondary | ICD-10-CM | POA: Diagnosis not present

## 2018-11-08 DIAGNOSIS — Z85828 Personal history of other malignant neoplasm of skin: Secondary | ICD-10-CM | POA: Diagnosis not present

## 2018-11-08 DIAGNOSIS — B078 Other viral warts: Secondary | ICD-10-CM | POA: Diagnosis not present

## 2018-11-08 DIAGNOSIS — R238 Other skin changes: Secondary | ICD-10-CM | POA: Diagnosis not present

## 2018-11-08 DIAGNOSIS — Z08 Encounter for follow-up examination after completed treatment for malignant neoplasm: Secondary | ICD-10-CM | POA: Diagnosis not present

## 2018-11-08 DIAGNOSIS — X32XXXA Exposure to sunlight, initial encounter: Secondary | ICD-10-CM | POA: Diagnosis not present

## 2018-11-08 DIAGNOSIS — D2271 Melanocytic nevi of right lower limb, including hip: Secondary | ICD-10-CM | POA: Diagnosis not present

## 2018-11-08 DIAGNOSIS — D2262 Melanocytic nevi of left upper limb, including shoulder: Secondary | ICD-10-CM | POA: Diagnosis not present

## 2018-11-08 DIAGNOSIS — D225 Melanocytic nevi of trunk: Secondary | ICD-10-CM | POA: Diagnosis not present

## 2018-11-08 DIAGNOSIS — D2261 Melanocytic nevi of right upper limb, including shoulder: Secondary | ICD-10-CM | POA: Diagnosis not present

## 2018-11-08 DIAGNOSIS — D2272 Melanocytic nevi of left lower limb, including hip: Secondary | ICD-10-CM | POA: Diagnosis not present

## 2018-12-09 ENCOUNTER — Other Ambulatory Visit: Payer: Medicare Other

## 2018-12-13 ENCOUNTER — Other Ambulatory Visit: Payer: Self-pay | Admitting: Family Medicine

## 2018-12-13 ENCOUNTER — Other Ambulatory Visit: Payer: Medicare Other

## 2018-12-13 DIAGNOSIS — N401 Enlarged prostate with lower urinary tract symptoms: Secondary | ICD-10-CM | POA: Diagnosis not present

## 2018-12-13 DIAGNOSIS — N138 Other obstructive and reflux uropathy: Secondary | ICD-10-CM

## 2018-12-13 DIAGNOSIS — Z87898 Personal history of other specified conditions: Secondary | ICD-10-CM

## 2018-12-14 LAB — PSA: Prostate Specific Ag, Serum: 0.8 ng/mL (ref 0.0–4.0)

## 2018-12-16 ENCOUNTER — Ambulatory Visit (INDEPENDENT_AMBULATORY_CARE_PROVIDER_SITE_OTHER): Payer: Medicare Other | Admitting: Urology

## 2018-12-16 ENCOUNTER — Encounter: Payer: Self-pay | Admitting: Urology

## 2018-12-16 VITALS — BP 132/81 | HR 57 | Ht 69.0 in | Wt 137.0 lb

## 2018-12-16 DIAGNOSIS — N401 Enlarged prostate with lower urinary tract symptoms: Secondary | ICD-10-CM

## 2018-12-16 DIAGNOSIS — Z87898 Personal history of other specified conditions: Secondary | ICD-10-CM | POA: Diagnosis not present

## 2018-12-16 DIAGNOSIS — N138 Other obstructive and reflux uropathy: Secondary | ICD-10-CM

## 2018-12-16 LAB — BLADDER SCAN AMB NON-IMAGING

## 2018-12-16 MED ORDER — FINASTERIDE 5 MG PO TABS: 5.0000 mg | ORAL_TABLET | Freq: Every day | ORAL | 3 refills | Status: DC

## 2018-12-16 NOTE — Progress Notes (Signed)
12/16/2018  9:06 AM   Johnny Munoz [birth year] [id number]  Referring provider: [name], MD [address] [place], [place] [address]  Chief Complaint  Patient presents with  . Elevated PSA    HPI: Johnny Munoz is a 68 y.o. White or Caucasian male with a personal history of elevated PSA and BPH with LUTS. He presents today for his annual evaluation and management of above.  I last saw the patient on 12/10/2017 to establish care for his elevated PSA and BPH. At this visit, we decided continue PSA surveillance and continue conservative management and finasteride 5 mg qd for his lower urinary tract symptoms.  History of Elevated PSA: The patient underwent a prostate biopsy in 10/2011 following a PSA of 4.1 that was benign. He was followed by Dr. [name] at [facility] at that time.  Most recent PSA was 0.8 ng/dL on 12/13/2018. It remains stable.  BPH with LUTS: His IPSS score today is as below. It indicated moderate lower urinary tract symptomatology and he is currently pleased with his quality of life due to these symptoms. His score today is a slight outlook improvement from his previous 8/2.  His PVR today is 99 mL.  He denies any dysuria, hematuria, and/or suprapubic pain. Denies any recent fevers, chills, nausea and/or vomiting.   He is managing his symptoms with finasteride 5 mg qd.   In 2007, he underwent a TUMT with Dr. [name]. His family history of PCa via a maternal uncle.   IPSS    Row Name 12/16/18 0800         International Prostate Symptom Score   How often have you had the sensation of not emptying your bladder?  Less than 1 in 5     How often have you had to urinate less than every two hours?  Less than half the time     How often have you found you stopped and started again several times when you urinated?  Less than 1 in 5 times     How often have you found it difficult to postpone urination?  Less than 1 in 5 times     How often have you had a weak urinary stream?  Less than 1 in 5 times     How often have you had to strain to start urination?  Less than 1 in 5 times     How many times did you typically get up at night to urinate?  1 Time     Total IPSS Score  8       Quality of Life due to urinary symptoms   If you were to spend the rest of your life with your urinary condition just the way it is now how would you feel about that?  Pleased       PMH: Past Medical History:  Diagnosis Date  . Allergy   . Arthritis   . BPH (benign prostatic hyperplasia)   . Hyperlipidemia     Surgical History: Past Surgical History:  Procedure Laterality Date  . COLONOSCOPY  2003, 10/12/13   Dr. [name], Dr [name]  . Trans Urethral Microwave Therapy      Home Medications:  Allergies as of 12/16/2018   No Known Allergies     Medication List       Accurate as of December 16, 2018  9:06 AM. Always use your most recent med list.        acetaminophen 500 MG tablet  Commonly known as:  TYLENOL Take 500 mg by mouth every 6 (six) hours as needed.   finasteride 5 MG tablet Commonly known as:  PROSCAR Take 1 tablet (5 mg total) by mouth daily.   fluticasone 50 MCG/ACT nasal spray Commonly known as:  FLONASE Place 2 sprays into both nostrils daily.   rosuvastatin 10 MG tablet Commonly known as:  CRESTOR TAKE ONE TABLET BY MOUTH DAILY      Allergies: No Known Allergies  Family History: Family History  Problem Relation Age of Onset  . Diabetes Father   . Heart disease Father        CABG after 32  . Prostate cancer Maternal Uncle   . Kidney cancer Neg Hx   . Bladder Cancer Neg Hx     Social History:  reports that he has never smoked. He has never used smokeless tobacco. He reports current alcohol use of about 3.0 standard drinks of alcohol per week. He reports that he does not use drugs.  ROS: UROLOGY Frequent Urination?: No Hard to postpone urination?: No Burning/pain with urination?: No Get  up at night to urinate?: No Leakage of urine?: No Urine stream starts and stops?: No Trouble starting stream?: No Do you have to strain to urinate?: No Blood in urine?: No Urinary tract infection?: No Sexually transmitted disease?: No Injury to kidneys or bladder?: No Painful intercourse?: No Weak stream?: No Erection problems?: No Penile pain?: No  Gastrointestinal Nausea?: No Vomiting?: No Indigestion/heartburn?: No Diarrhea?: No Constipation?: No  Constitutional Fever: No Night sweats?: No Weight loss?: No Fatigue?: No  Skin Skin rash/lesions?: No Itching?: No  Eyes Blurred vision?: No Double vision?: No  Ears/Nose/Throat Sore throat?: No Sinus problems?: No  Hematologic/Lymphatic Swollen glands?: No Easy bruising?: No  Cardiovascular Leg swelling?: No Chest pain?: No  Respiratory Shortness of breath?: No  Endocrine Excessive thirst?: No  Musculoskeletal Back pain?: No Joint pain?: No  Neurological Headaches?: No Dizziness?: No  Psychologic Depression?: No Anxiety?: No  Physical Exam: BP 132/81   Pulse (!) 57   Ht 5\' 9"  (1.753 m)   Wt 137 lb (62.1 kg)   BMI 20.23 kg/m   Constitutional:  Well nourished. Alert and oriented, No acute distress. HEENT: [place] AT, moist mucus membranes.  Trachea midline, no masses. Cardiovascular: No clubbing, cyanosis, or edema. Respiratory: Normal respiratory effort, no increased work of breathing. GI: Abdomen is soft, non tender, non distended, no abdominal masses. Liver and spleen not palpable.  No hernias appreciated.  Stool sample for occult testing is not indicated.   GU: No CVA tenderness.  No bladder fullness or masses.  Patient with circumcised phallus.  Urethral meatus is patent.  No penile discharge. No penile lesions or rashes. Scrotum without lesions, cysts, rashes and/or edema.  Testicles are located scrotally bilaterally. No masses are appreciated in the testicles. Left and right epididymis are  normal. Rectal: Patient with  normal sphincter tone. Anus and perineum without scarring or rashes. No rectal masses are appreciated. Prostate is approximately 45 grams, no nodules are appreciated.  Skin: No rashes, bruises or suspicious lesions. Lymph: No inguinal adenopathy. Neurologic: Grossly intact, no focal deficits, moving all 4 extremities. Psychiatric: Normal mood and affect.   Laboratory Data: Lab Results  Component Value Date   WBC 5.7 07/31/2018   HGB 16.3 07/31/2018   HCT 47.3 07/31/2018   MCV 92.6 07/31/2018   PLT 251 07/31/2018   Lab Results  Component Value Date   CREATININE 0.98 07/31/2018  Results for GLENNIE, RODDA (MRN [id number]) as of 12/16/2018 08:54  Ref. Range 06/28/2013 09:13 03/06/2016 10:42 08/22/2016 08:15 08/27/2016 00:00 12/10/2017 10:59 12/13/2018 08:55  PSA Unknown 2.11 0.75  1.0    Prostate Specific Ag, Serum Latest Ref Range: 0.0 - 4.0 ng/mL   1.0  0.9 0.8   Lab Results  Component Value Date   HGBA1C 5.9 (H) 09/17/2018   Results for Graciano, Sebastion A (MRN [id number]) as of 12/16/2018 08:54  Ref. Range 12/16/2018 08:40  Scan Result Unknown 54ml   Assessment & Plan:   1. Elevated PSA  - Prostate biopsy (10/2011) was benign, following 4.1 ng/dL PSA  - Most recent PSA was 0.8 (1.6) ng/dL on 12/13/2018. It remains stable.  2. BPH with LUTS  - His symptoms are well managed with conservative management (avoiding bladder irritants and timed voiding) and finasteride 5 mg qd  - Discussed surgical intervention options (BOO procedures v. uroLIFT) for future possible progression of his symptoms  - Recommended that if he feels that his symptoms worsen to the point where he is considering these options he should follow up with Dr. Bernardo Heater for evaluation.  - Patient reports that he is still pleased with his symptoms at this time.   Return in about 1 year (around 12/17/2019) for IPSS, PSA and exam.  Zara Council, PA-C [place] [address] [place], [place] [address] [phone]  I, Temidayo Atanda-Ogunleye , am acting as a scribe for [facility], PA-C  I have reviewed the above documentation for accuracy and completeness, and I agree with the above.    Zara Council, PA-C

## 2019-02-02 DIAGNOSIS — H6123 Impacted cerumen, bilateral: Secondary | ICD-10-CM | POA: Diagnosis not present

## 2019-02-02 DIAGNOSIS — H60339 Swimmer's ear, unspecified ear: Secondary | ICD-10-CM | POA: Diagnosis not present

## 2019-02-02 DIAGNOSIS — J309 Allergic rhinitis, unspecified: Secondary | ICD-10-CM | POA: Diagnosis not present

## 2019-04-22 ENCOUNTER — Other Ambulatory Visit: Payer: Self-pay | Admitting: Internal Medicine

## 2019-04-22 ENCOUNTER — Encounter: Payer: Self-pay | Admitting: Internal Medicine

## 2019-04-25 ENCOUNTER — Ambulatory Visit (INDEPENDENT_AMBULATORY_CARE_PROVIDER_SITE_OTHER): Payer: Medicare Other | Admitting: Internal Medicine

## 2019-04-25 ENCOUNTER — Other Ambulatory Visit: Payer: Self-pay

## 2019-04-25 DIAGNOSIS — R001 Bradycardia, unspecified: Secondary | ICD-10-CM

## 2019-04-25 DIAGNOSIS — R7303 Prediabetes: Secondary | ICD-10-CM

## 2019-04-25 DIAGNOSIS — E785 Hyperlipidemia, unspecified: Secondary | ICD-10-CM

## 2019-04-25 DIAGNOSIS — R7989 Other specified abnormal findings of blood chemistry: Secondary | ICD-10-CM | POA: Diagnosis not present

## 2019-04-25 DIAGNOSIS — Z87898 Personal history of other specified conditions: Secondary | ICD-10-CM | POA: Diagnosis not present

## 2019-04-25 DIAGNOSIS — Z7189 Other specified counseling: Secondary | ICD-10-CM | POA: Diagnosis not present

## 2019-04-25 DIAGNOSIS — R5383 Other fatigue: Secondary | ICD-10-CM | POA: Diagnosis not present

## 2019-04-25 MED ORDER — ROSUVASTATIN CALCIUM 10 MG PO TABS: 10.0000 mg | ORAL_TABLET | Freq: Every day | ORAL | 0 refills | Status: DC

## 2019-04-25 NOTE — Patient Instructions (Signed)
[place] Day!  I have ordered the fasting labs  And will refill the crestor for 90 days  You can set up the fasting lab appt at your leisure

## 2019-04-25 NOTE — Progress Notes (Signed)
Virtual Visit via [email or web address]  This visit type was conducted due to national recommendations for restrictions regarding the COVID-19 pandemic (e.g. social distancing).  This format is felt to be most appropriate for this patient at this time.  All issues noted in this document were discussed and addressed.  No physical exam was performed (except for noted visual exam findings with Video Visits).   I connected with@ on 04/25/19 at  9:30 AM EDT by a video enabled telemedicine application or telephone and verified that I am speaking with the correct person using two identifiers. Location patient: home Location provider: home office Persons participating in the virtual visit: patient, provider  I discussed the limitations, risks, security and privacy concerns of performing an evaluation and management service by telephone and the availability of in person appointments. I also discussed with the patient that there may be a patient responsible charge related to this service. The patient expressed understanding and agreed to proceed.  Reason for visit: 6 month follow up on hyperlipidemia, prediabetes, and history of syncope   HPI:   The patient has no signs or symptoms of COVID 19 infection (fever, cough, sore throat  or shortness of breath beyond what is typical for patient).  Patient denies contact with other persons with the above mentioned symptoms or with anyone confirmed to have COVID 19 .  She has been minimizing her contact with the public and using a mask and hand sanitizer when she comes into any contact with the public.   He feels generally well.  He has intentionally lost weight by reducing his starches and walking daily for several miles.  He has not had any subsequent syncopal episodes. His stress level has improved since last visit now that he has successfully sold his house and moved into a smaller home.    ROS: Patient denies headache, fevers, malaise, unintentional weight loss, skin rash,  eye pain, sinus congestion and sinus pain, sore throat, dysphagia,  hemoptysis , cough, dyspnea, wheezing, chest pain, palpitations, orthopnea, edema, abdominal pain, nausea, melena, diarrhea, constipation, flank pain, dysuria, hematuria, urinary  Frequency, nocturia, numbness, tingling, seizures,  Focal weakness, Loss of consciousness,  Tremor, insomnia, depression, anxiety, and suicidal ideation.      Past Medical History:  Diagnosis Date  . Allergy   . Arthritis   . BPH (benign prostatic hyperplasia)   . Hyperlipidemia     Past Surgical History:  Procedure Laterality Date  . COLONOSCOPY  2003, 10/12/13   Dr. [name], Dr [name]  . Trans Urethral Microwave Therapy      Family History  Problem Relation Age of Onset  . Diabetes Father   . Heart disease Father        CABG after 7  . Prostate cancer Maternal Uncle   . Kidney cancer Neg Hx   . Bladder Cancer Neg Hx     SOCIAL HX:  reports that he has never smoked. He has never used smokeless tobacco. He reports current alcohol use of about 3.0 standard drinks of alcohol per week. He reports that he does not use drugs.   Current Outpatient Medications:  .  acetaminophen (TYLENOL) 500 MG tablet, Take 500 mg by mouth every 6 (six) hours as needed., Disp: , Rfl:  .  finasteride (PROSCAR) 5 MG tablet, Take 1 tablet (5 mg total) by mouth daily., Disp: 90 tablet, Rfl: 3 .  fluticasone (FLONASE) 50 MCG/ACT nasal spray, Place 2 sprays into both nostrils daily., Disp: 16 g, Rfl: 6 .  mometasone (ELOCON) 0.1 % lotion, , Disp: , Rfl:  .  rosuvastatin (CRESTOR) 10 MG tablet, Take 1 tablet (10 mg total) by mouth daily., Disp: 90 tablet, Rfl: 0  EXAM:  VITALS per patient if applicable:  GENERAL: alert, oriented, appears well and in no acute distress  HEENT: atraumatic, conjunttiva clear, no obvious abnormalities on inspection of external nose and ears  NECK: normal movements of the head and neck  LUNGS: on inspection no signs of  respiratory distress, breathing rate appears normal, no obvious gross SOB, gasping or wheezing  CV: no obvious cyanosis  MS: moves all visible extremities without noticeable abnormality  PSYCH/NEURO: pleasant and cooperative, no obvious depression or anxiety, speech and thought processing grossly intact  ASSESSMENT AND PLAN:  Discussed the following assessment and plan:  Hyperlipidemia, unspecified hyperlipidemia type - Plan: Lipid panel  Abnormal thyroid blood test  Prediabetes - Plan: Comprehensive metabolic panel, Hemoglobin A1c  Fatigue, unspecified type - Plan: TSH  Bradycardia with 41-50 beats per minute  History of elevated PSA  Educated About Covid-19 Virus Infection  Abnormal thyroid blood test TSH was elevated during prior ER visit for syncope, but was normal on repeat testing.  Follow up testing advised   Bradycardia with 41-50 beats per minute His baseline heart rate is on the low side but increases appropriately with exercise.  He has not had any subsequent  syncopal events. .   History of elevated PSA Secondary to BPH, managed with proscar.  If syncope recurs with no evidence of arrhythmia will recommend that he suspend proscar   Educated About Covid-19 Virus Infection Educated patient on the newly broadened list of signs and symptoms of COVID-19 infection and ways to avoid the viral infection including washing hands frequently with soap and water,  using hand sanitizer if unable to wash, avoiding touching face,  staying at home and limiting visitors,  and avoiding contact with people coming in and out of home.  Discussed the potential ineffectiveness of hand sanitizer if left in environments > 110 degrees (ie , the car).  Reminded patient to call office with questions/concerns.  The importance of social distancing was discussed today  Hyperlipidemia, unspecified Managed with Crestor due to elevation in risk to 16% using the FRC.   He has no side effects. No  changes today,  Liver enzymes ordered   Lab Results  Component Value Date   CHOL 155 03/22/2018   HDL 68.30 03/22/2018   LDLCALC 73 03/22/2018   LDLDIRECT 120.8 12/29/2013   TRIG 69.0 03/22/2018   CHOLHDL 2 03/22/2018   Lab Results  Component Value Date   ALT 27 03/22/2018   AST 23 03/22/2018   GGT 15 08/22/2016   ALKPHOS 51 03/22/2018   BILITOT 0.4 03/22/2018       I discussed the assessment and treatment plan with the patient. The patient was provided an opportunity to ask questions and all were answered. The patient agreed with the plan and demonstrated an understanding of the instructions.   The patient was advised to call back or seek an in-person evaluation if the symptoms worsen or if the condition fails to improve as anticipated.  I provided  25 minutes of non-face-to-face time during this encounter.   Johnny Mc, MD

## 2019-04-26 DIAGNOSIS — Z7189 Other specified counseling: Secondary | ICD-10-CM | POA: Insufficient documentation

## 2019-04-26 NOTE — Assessment & Plan Note (Signed)

## 2019-04-26 NOTE — Assessment & Plan Note (Signed)
Secondary to BPH, managed with proscar.  If syncope recurs with no evidence of arrhythmia will recommend that he suspend proscar

## 2019-04-26 NOTE — Assessment & Plan Note (Addendum)
Managed with Crestor due to elevation in risk to 16% using the FRC.   He has no side effects. No changes today,  Liver enzymes ordered   Lab Results  Component Value Date   CHOL 155 03/22/2018   HDL 68.30 03/22/2018   LDLCALC 73 03/22/2018   LDLDIRECT 120.8 12/29/2013   TRIG 69.0 03/22/2018   CHOLHDL 2 03/22/2018   Lab Results  Component Value Date   ALT 27 03/22/2018   AST 23 03/22/2018   GGT 15 08/22/2016   ALKPHOS 51 03/22/2018   BILITOT 0.4 03/22/2018

## 2019-04-26 NOTE — Assessment & Plan Note (Signed)
His baseline heart rate is on the low side but increases appropriately with exercise.  He has not had any subsequent  syncopal events. Johnny Munoz

## 2019-04-26 NOTE — Assessment & Plan Note (Signed)
TSH was elevated during prior ER visit for syncope, but was normal on repeat testing.  Follow up testing advised

## 2019-04-28 ENCOUNTER — Other Ambulatory Visit (INDEPENDENT_AMBULATORY_CARE_PROVIDER_SITE_OTHER): Payer: Medicare Other

## 2019-04-28 ENCOUNTER — Other Ambulatory Visit: Payer: Self-pay

## 2019-04-28 DIAGNOSIS — R5383 Other fatigue: Secondary | ICD-10-CM | POA: Diagnosis not present

## 2019-04-28 DIAGNOSIS — R7303 Prediabetes: Secondary | ICD-10-CM | POA: Diagnosis not present

## 2019-04-28 DIAGNOSIS — E785 Hyperlipidemia, unspecified: Secondary | ICD-10-CM

## 2019-04-28 LAB — LIPID PANEL
Cholesterol: 170 mg/dL (ref 0–200)
HDL: 69.5 mg/dL (ref >39.00)
LDL Cholesterol: 84 mg/dL (ref 0–99)
NonHDL: 100.55
Total CHOL/HDL Ratio: 2
Triglycerides: 85 mg/dL (ref 0.0–149.0)
VLDL: 17 mg/dL (ref 0.0–40.0)

## 2019-04-28 LAB — TSH: TSH: 1.45 u[IU]/mL (ref 0.35–4.50)

## 2019-04-28 LAB — HEMOGLOBIN A1C: Hgb A1c MFr Bld: 6.1 % (ref 4.6–6.5)

## 2019-04-28 LAB — COMPREHENSIVE METABOLIC PANEL
ALT: 22 U/L (ref 0–53)
AST: 15 U/L (ref 0–37)
Albumin: 4.4 g/dL (ref 3.5–5.2)
Alkaline Phosphatase: 53 U/L (ref 39–117)
BUN: 19 mg/dL (ref 6–23)
CO2: 31 mEq/L (ref 19–32)
Calcium: 9.3 mg/dL (ref 8.4–10.5)
Chloride: 103 mEq/L (ref 96–112)
Creatinine, Ser: 0.94 mg/dL (ref 0.40–1.50)
GFR: 79.91 mL/min (ref >60.00)
Glucose, Bld: 95 mg/dL (ref 70–99)
Potassium: 4.5 mEq/L (ref 3.5–5.1)
Sodium: 141 mEq/L (ref 135–145)
Total Bilirubin: 0.5 mg/dL (ref 0.2–1.2)
Total Protein: 6.5 g/dL (ref 6.0–8.3)

## 2019-08-03 DIAGNOSIS — J309 Allergic rhinitis, unspecified: Secondary | ICD-10-CM | POA: Diagnosis not present

## 2019-08-03 DIAGNOSIS — H6123 Impacted cerumen, bilateral: Secondary | ICD-10-CM | POA: Diagnosis not present

## 2019-08-18 ENCOUNTER — Encounter: Payer: Self-pay | Admitting: Internal Medicine

## 2019-08-27 ENCOUNTER — Other Ambulatory Visit: Payer: Self-pay

## 2019-08-27 ENCOUNTER — Ambulatory Visit (INDEPENDENT_AMBULATORY_CARE_PROVIDER_SITE_OTHER): Payer: Medicare Other

## 2019-08-27 DIAGNOSIS — Z23 Encounter for immunization: Secondary | ICD-10-CM | POA: Diagnosis not present

## 2019-08-30 ENCOUNTER — Ambulatory Visit: Payer: Medicare Other

## 2019-09-19 ENCOUNTER — Encounter: Payer: Self-pay | Admitting: Internal Medicine

## 2019-09-20 ENCOUNTER — Encounter: Payer: Medicare Other | Admitting: Internal Medicine

## 2019-09-22 ENCOUNTER — Ambulatory Visit (INDEPENDENT_AMBULATORY_CARE_PROVIDER_SITE_OTHER): Payer: Medicare Other

## 2019-09-22 ENCOUNTER — Other Ambulatory Visit: Payer: Self-pay

## 2019-09-22 DIAGNOSIS — Z0000 Encounter for general adult medical examination without abnormal findings: Secondary | ICD-10-CM

## 2019-09-22 NOTE — Patient Instructions (Addendum)
  Mr. Hennesy , Thank you for taking time to come for your Medicare Wellness Visit. I appreciate your ongoing commitment to your health goals. Please review the following plan we discussed and let me know if I can assist you in the future.   These are the goals we discussed: Goals      Patient Stated   . Obtain Hemoglobin A1C at least 2 times per year (pt-stated)     I want to monitor my A1C and make sure it stays well within range. Also, I want to have a healthy diet that compliments lower A1C.       This is a list of the screening recommended for you and due dates:  Health Maintenance  Topic Date Due  . Pneumonia vaccines (2 of 2 - PPSV23) 10/11/2019*  . Colon Cancer Screening  10/13/2023  . Tetanus Vaccine  07/31/2028  . Flu Shot  Completed  .  Hepatitis C: One time screening is recommended by Center for Disease Control  (CDC) for  adults born from [birth year] through 1965.   Completed  *Topic was postponed. The date shown is not the original due date.

## 2019-09-22 NOTE — Progress Notes (Signed)
Subjective:   Johnny Munoz is a 68 y.o. male who presents for Medicare Annual/Subsequent preventive examination.  Review of Systems:  No ROS.  Medicare Wellness Virtual Visit.  Visual/audio telehealth visit, UTA vital signs.   See social history for additional risk factors.   Cardiac Risk Factors include: advanced age (>30men, >28 women);male gender     Objective:    Vitals: There were no vitals taken for this visit.  There is no height or weight on file to calculate BMI.  Advanced Directives 09/22/2019 09/17/2018 07/31/2018  Does Patient Have a Medical Advance Directive? Yes Yes Yes  Type of Paramedic of [place];Living will [place];Living will [place]  Does patient want to make changes to medical advance directive? No - Patient declined No - Patient declined No - Patient declined  Copy of [place] in Chart? No - copy requested No - copy requested No - copy requested  Would patient like information on creating a medical advance directive? - - No - Patient declined    Tobacco Social History   Tobacco Use  Smoking Status Never Smoker  Smokeless Tobacco Never Used     Counseling given: Not Answered   Clinical Intake:  Pre-visit preparation completed: Yes        Diabetes: No  How often do you need to have someone help you when you read instructions, pamphlets, or other written materials from your doctor or pharmacy?: 1 - Never  Interpreter Needed?: No     Past Medical History:  Diagnosis Date  . Allergy   . Arthritis   . BPH (benign prostatic hyperplasia)   . Hyperlipidemia    Past Surgical History:  Procedure Laterality Date  . COLONOSCOPY  2003, 10/12/13   Dr. [name], Dr [name]  . Trans Urethral Microwave Therapy     Family History  Problem Relation Age of Onset  . Diabetes Father   . Heart disease Father        CABG after 4  . Prostate cancer Maternal  Uncle   . Kidney cancer Neg Hx   . Bladder Cancer Neg Hx    Social History   Socioeconomic History  . Marital status: Married    Spouse name: Not on file  . Number of children: Not on file  . Years of education: 70  . Highest education level: Not on file  Occupational History  . Occupation: Research scientist (physical sciences): [employer]  . Occupation: Retired  Scientific laboratory technician  . Financial resource strain: Not hard at all  . Food insecurity    Worry: Never true    Inability: Never true  . Transportation needs    Medical: No    Non-medical: No  Tobacco Use  . Smoking status: Never Smoker  . Smokeless tobacco: Never Used  Substance and Sexual Activity  . Alcohol use: Yes    Alcohol/week: 3.0 standard drinks    Types: 3 drink(s) per week  . Drug use: No  . Sexual activity: Not on file  Lifestyle  . Physical activity    Days per week: 5 days    Minutes per session: 40 min  . Stress: Not at all  Relationships  . Social Herbalist on phone: Not on file    Gets together: More than three times a week    Attends religious service: More than 4 times per year    Active member  of club or organization: Yes    Attends meetings of clubs or organizations: More than 4 times per year    Relationship status: Married  Other Topics Concern  . Not on file  Social History Narrative   Regular exercise-yes   Caffeine Use-yes          Outpatient Encounter Medications as of 09/22/2019  Medication Sig  . acetaminophen (TYLENOL) 500 MG tablet Take 500 mg by mouth every 6 (six) hours as needed.  . finasteride (PROSCAR) 5 MG tablet Take 1 tablet (5 mg total) by mouth daily.  . fluticasone (FLONASE) 50 MCG/ACT nasal spray Place 2 sprays into both nostrils daily.  . mometasone (ELOCON) 0.1 % lotion   . rosuvastatin (CRESTOR) 10 MG tablet Take 1 tablet (10 mg total) by mouth daily.   No facility-administered encounter medications on file as of 09/22/2019.     Activities of  Daily Living In your present state of health, do you have any difficulty performing the following activities: 09/22/2019  Hearing? N  Vision? N  Difficulty concentrating or making decisions? N  Walking or climbing stairs? N  Dressing or bathing? N  Doing errands, shopping? N  Preparing Food and eating ? N  Using the Toilet? N  In the past six months, have you accidently leaked urine? N  Do you have problems with loss of bowel control? N  Managing your Medications? N  Managing your Finances? N  Housekeeping or managing your Housekeeping? N  Some recent data might be hidden    Patient Care Team: [name], MD as PCP - General (Internal Medicine) [name], Forest Gleason, MD (General Surgery) [name], MD (Internal Medicine) Christene Lye, MD (General Surgery) [name], MD (Internal Medicine)   Assessment:   This is a routine wellness examination for Johnny Munoz.  Nurse connected with patient 09/22/19 at  9:00 AM EDT by a phone  enabled telemedicine application and verified that I am speaking with the correct person using two identifiers. Patient stated full name and DOB. Patient gave permission to continue with virtual visit. Patient's location was at home and Nurse's location was at [place] office.   Health Maintenance Due: -PNA - discussed; to be completed with doctor in visit or local pharmacy.  Update all pending maintenance due as appropriate.   See completed HM at the end of note.   Eye: Visual acuity not assessed. Virtual visit. Wears corrective lenses. Followed by their ophthalmologist every 12 months.   Dental: Visits every 6 months.    Hearing: Demonstrates normal hearing during visit.  Safety:  Patient feels safe at home- yes Patient does have smoke detectors at home- yes Patient does wear sunscreen or protective clothing when in direct sunlight - yes Patient does wear seat belt when in a moving vehicle - yes Patient drives- yes Adequate  lighting in walkways free from debris- yes Grab bars and handrails used as appropriate- yes Ambulates with no assistive device Cell phone on person when ambulating outside of the home- yes  Social: Alcohol intake - yes      Smoking history- never   Smokers in home? none Illicit drug use? none  Depression: PHQ 2 &9 complete. See screening below. Denies irritability, anhedonia, sadness/tearfullness.    Falls: See screening below.    Medication: Taking as directed and without issues.   Covid-19: Precautions and sickness symptoms discussed. Wears mask, social distancing, hand hygiene as appropriate.   Activities of Daily Living Patient denies needing  assistance with: household chores, feeding themselves, getting from bed to chair, getting to the toilet, bathing/showering, dressing, managing money, or preparing meals.   Memory: Patient is alert. Patient denies difficulty focusing or concentrating. Correctly identified the president of the [place], season and recall. Patient likes to play the piano for brain stimulation.  BMI- discussed the importance of a healthy diet, water intake and the benefits of aerobic exercise.  Educational material provided.  Physical activity- walking 2.5-5 mile daily  Diet: 2000 calorie intake Water: good intake Caffeine: yes  Other Providers Patient Care Team: [name], MD as PCP - General (Internal Medicine) [name], Forest Gleason, MD (General Surgery) [name], MD (Internal Medicine) Christene Lye, MD (General Surgery) [name], MD (Internal Medicine) Exercise Activities and Dietary recommendations Current Exercise Habits: Home exercise routine, Type of exercise: walking, Time (Minutes): > 60, Frequency (Times/Week): 5, Weekly Exercise (Minutes/Week): 0, Intensity: Mild  Goals      Patient Stated   . Obtain Hemoglobin A1C at least 2 times per year (pt-stated)     I want to monitor my A1C and make sure it stays well  within range. Also, I want to have a healthy diet that compliments lower A1C.       Fall Risk Fall Risk  09/22/2019 08/25/2017  Falls in the past year? 0 No   Timed Get Up and Go Performed: no, virtual visit  Depression Screen PHQ 2/9 Scores 09/22/2019 09/17/2018 08/25/2017  PHQ - 2 Score 0 0 0  PHQ- 9 Score - 1 1    Cognitive Function MMSE - Mini Mental State Exam 09/17/2018  Orientation to time 5  Orientation to Place 5  Registration 3  Attention/ Calculation 5  Recall 3  Language- name 2 objects 2  Language- repeat 1  Language- follow 3 step command 3  Language- read & follow direction 1  Write a sentence 1  Copy design 1  Total score 30     6CIT Screen 09/22/2019  What Year? 0 points  What month? 0 points  What time? 0 points  Count back from 20 0 points  Months in reverse 0 points  Repeat phrase 0 points  Total Score 0    Immunization History  Administered Date(s) Administered  . Fluad Quad(high Dose 65+) 08/27/2019  . Influenza, High Dose Seasonal PF 08/25/2017  . Influenza,inj,Quad PF,6+ Mos 08/11/2014, 09/14/2015, 09/17/2018  . Influenza-Unspecified 08/31/2012  . Pneumococcal Conjugate-13 08/25/2017  . Td 12/30/2007  . Tdap 07/31/2018  . Zoster 08/13/2015   Screening Tests Health Maintenance  Topic Date Due  . PNA vac Low Risk Adult (2 of 2 - PPSV23) 10/11/2019 (Originally 08/25/2018)  . COLONOSCOPY  10/13/2023  . TETANUS/TDAP  07/31/2028  . INFLUENZA VACCINE  Completed  . Hepatitis C Screening  Completed       Plan:   Keep all routine maintenance appointments.   Cpe 10/11/19 @ 2:30.   Medicare Attestation I have personally reviewed: The patient's medical and social history Their use of alcohol, tobacco or illicit drugs Their current medications and supplements The patient's functional ability including ADLs,fall risks, home safety risks, cognitive, and hearing and visual impairment Diet and physical activities Evidence for depression    In addition, I have reviewed and discussed with patient certain preventive protocols, quality metrics, and best practice recommendations. A written personalized care plan for preventive services as well as general preventive health recommendations were provided to patient via mail.     Lynder Parents  L, LPN  [id number]

## 2019-10-07 ENCOUNTER — Other Ambulatory Visit: Payer: Self-pay

## 2019-10-11 ENCOUNTER — Other Ambulatory Visit: Payer: Self-pay

## 2019-10-11 ENCOUNTER — Ambulatory Visit (INDEPENDENT_AMBULATORY_CARE_PROVIDER_SITE_OTHER): Payer: Medicare Other | Admitting: Internal Medicine

## 2019-10-11 ENCOUNTER — Encounter: Payer: Self-pay | Admitting: Internal Medicine

## 2019-10-11 VITALS — BP 108/70 | HR 60 | Temp 97.0°F | Ht 69.0 in | Wt 137.2 lb

## 2019-10-11 DIAGNOSIS — E785 Hyperlipidemia, unspecified: Secondary | ICD-10-CM | POA: Diagnosis not present

## 2019-10-11 DIAGNOSIS — R7989 Other specified abnormal findings of blood chemistry: Secondary | ICD-10-CM

## 2019-10-11 DIAGNOSIS — R7303 Prediabetes: Secondary | ICD-10-CM

## 2019-10-11 DIAGNOSIS — R5383 Other fatigue: Secondary | ICD-10-CM

## 2019-10-11 NOTE — Patient Instructions (Addendum)
Try the SOLA low carb bread Kristopher Oppenheim  And [employer],  Frozen bread )  3 g/slice  Tastes great!  .[employer] and [employer] both make  Low carb tortillas made from whole wheat  stouffer's low carb Pizza in a bowl (made with cauliflower)  Healthy choice entrees have fewer carbs than lean cuisine  Avoid  Bananas,  Pineapple ,  Watermelon and corn   Health Maintenance After Age 68 After age 2, you are at a higher risk for certain long-term diseases and infections as well as injuries from falls. Falls are a major cause of broken bones and head injuries in people who are older than age 25. Getting regular preventive care can help to keep you healthy and well. Preventive care includes getting regular testing and making lifestyle changes as recommended by your health care provider. Talk with your health care provider about:  Which screenings and tests you should have. A screening is a test that checks for a disease when you have no symptoms.  A diet and exercise plan that is right for you. What should I know about screenings and tests to prevent falls? Screening and testing are the best ways to find a health problem early. Early diagnosis and treatment give you the best chance of managing medical conditions that are common after age 5. Certain conditions and lifestyle choices may cbcmake you more likely to have a fall. Your health care provider may recommend:  Regular vision checks. Poor vision and conditions such as cataracts can make you more likely to have a fall. If you wear glasses, make sure to get your prescription updated if your vision changes.  Medicine review. Work with your health care provider to regularly review all of the medicines you are taking, including over-the-counter medicines. Ask your health care provider about any side effects that may make you more likely to have a fall. Tell your health care provider if any medicines that you take make you feel dizzy or sleepy.   Osteoporosis screening. Osteoporosis is a condition that causes the bones to get weaker. This can make the bones weak and cause them to break more easily.  Blood pressure screening. Blood pressure changes and medicines to control blood pressure can make you feel dizzy.  Strength and balance checks. Your health care provider may recommend certain tests to check your strength and balance while standing, walking, or changing positions.  Foot health exam. Foot pain and numbness, as well as not wearing proper footwear, can make you more likely to have a fall.  Depression screening. You may be more likely to have a fall if you have a fear of falling, feel emotionally low, or feel unable to do activities that you used to do.  Alcohol use screening. Using too much alcohol can affect your balance and may make you more likely to have a fall. What actions can I take to lower my risk of falls? General instructions  Talk with your health care provider about your risks for falling. Tell your health care provider if: ? You fall. Be sure to tell your health care provider about all falls, even ones that seem minor. ? You feel dizzy, sleepy, or off-balance.  Take over-the-counter and prescription medicines only as told by your health care provider. These include any supplements.  Eat a healthy diet and maintain a healthy weight. A healthy diet includes low-fat dairy products, low-fat (lean) meats, and fiber from whole grains, beans, and lots of fruits and vegetables.  Home safety  Remove any tripping hazards, such as rugs, cords, and clutter.  Install safety equipment such as grab bars in bathrooms and safety rails on stairs.  Keep rooms and walkways well-lit. Activity   Follow a regular exercise program to stay fit. This will help you maintain your balance. Ask your health care provider what types of exercise are appropriate for you.  If you need a cane or walker, use it as recommended by your health  care provider.  Wear supportive shoes that have nonskid soles. Lifestyle  Do not drink alcohol if your health care provider tells you not to drink.  If you drink alcohol, limit how much you have: ? 0-1 drink a day for women. ? 0-2 drinks a day for men.  Be aware of how much alcohol is in your drink. In the [place] one drink equals one typical bottle of beer (12 oz), one-half glass of wine (5 oz), or one shot of hard liquor (1 oz).  Do not use any products that contain nicotine or tobacco, such as cigarettes and e-cigarettes. If you need help quitting, ask your health care provider. Summary  Having a healthy lifestyle and getting preventive care can help to protect your health and wellness after age 51.  Screening and testing are the best way to find a health problem early and help you avoid having a fall. Early diagnosis and treatment give you the best chance for managing medical conditions that are more common for people who are older than age 7.  Falls are a major cause of broken bones and head injuries in people who are older than age 39. Take precautions to prevent a fall at home.  Work with your health care provider to learn what changes you can make to improve your health and wellness and to prevent falls. This information is not intended to replace advice given to you by your health care provider. Make sure you discuss any questions you have with your health care provider. Document Released: 09/30/2017 Document Revised: 03/10/2019 Document Reviewed: 09/30/2017 Elsevier Patient Education  2020 [employer].

## 2019-10-11 NOTE — Assessment & Plan Note (Addendum)
Managed with Crestor due to elevation in risk to 16% using the FRC.   He has no side effects. Repeat labs due   Lab Results  Component Value Date   CHOL 170 04/28/2019   HDL 69.50 04/28/2019   LDLCALC 84 04/28/2019   LDLDIRECT 120.8 12/29/2013   TRIG 85.0 04/28/2019   CHOLHDL 2 04/28/2019   Lab Results  Component Value Date   ALT 22 04/28/2019   AST 15 04/28/2019   GGT 15 08/22/2016   ALKPHOS 53 04/28/2019   BILITOT 0.5 04/28/2019

## 2019-10-11 NOTE — Assessment & Plan Note (Signed)
Given his weight loss,  If TSH is suppressed will refer to endocrinology

## 2019-10-11 NOTE — Progress Notes (Signed)
Patient ID: Johnny Munoz, male    DOB: 1951/05/18  Age: 68 y.o. MRN: [id number]  The patient is here for follow up and  examination and management of other chronic and acute problems.   The risk factors are reflected in the social history.  The roster of all physicians providing medical care to patient - is listed in the Snapshot section of the chart.  Activities of daily living:  The patient is 100% independent in all ADLs: dressing, toileting, feeding as well as independent mobility  Home safety : The patient has smoke detectors in the home. They wear seatbelts.  There are no firearms at home. There is no violence in the home.   There is no risks for hepatitis, STDs or HIV. There is no   history of blood transfusion. They have no travel history to infectious disease endemic areas of the world.  The patient has seen their dentist in the last six month. They have seen their eye doctor in the last year. They admit to slight hearing difficulty with regard to whispered voices and some television programs.  They have deferred audiologic testing in the last year.  They do not  have excessive sun exposure. Discussed the need for sun protection: hats, long sleeves and use of sunscreen if there is significant sun exposure.   Diet: the importance of a healthy diet is discussed. They do have a healthy diet.  The benefits of regular aerobic exercise were discussed. She walks 4 times per week ,  20 minutes.   Depression screen: there are no signs or vegative symptoms of depression- irritability, change in appetite, anhedonia, sadness/tearfullness.  Cognitive assessment: the patient manages all their financial and personal affairs and is actively engaged. They could relate day,date,year and events; recalled 2/3 objects at 3 minutes; performed clock-face test normally.  The following portions of the patient's history were reviewed and updated as appropriate: allergies, current medications, past  family history, past medical history,  past surgical history, past social history  and problem list.  Visual acuity was not assessed per patient preference since she has regular follow up with her ophthalmologist. Hearing and body mass index were assessed and reviewed.   During the course of the visit the patient was educated and counseled about appropriate screening and preventive services including : fall prevention , diabetes screening, nutrition counseling, colorectal cancer screening, and recommended immunizations.    CC: The primary encounter diagnosis was Fatigue, unspecified type. Diagnoses of Hyperlipidemia, unspecified hyperlipidemia type, Prediabetes, and Abnormal thyroid blood test were also pertinent to this visit.  1) prediabetes: very worried about developing diabetes.  Has lost 12 lbs and changed diet  . Diet reviewed in detail.  2) Left hip pain ,  Intermittent . POSTERIOR  Has been walking 5 miles daily.  Pain improves with activity.  Using advil once in a blue moon ,  Not severe.   History Mateusz has a past medical history of Allergy, Arthritis, BPH (benign prostatic hyperplasia), and Hyperlipidemia.   He has a past surgical history that includes Trans Urethral Microwave Therapy and Colonoscopy (2003, 10/12/13).   His family history includes Diabetes in his father; Heart disease in his father; Prostate cancer in his maternal uncle.He reports that he has never smoked. He has never used smokeless tobacco. He reports current alcohol use of about 3.0 standard drinks of alcohol per week. He reports that he does not use drugs.  Outpatient Medications Prior to Visit  Medication Sig Dispense Refill  .  acetaminophen (TYLENOL) 500 MG tablet Take 500 mg by mouth every 6 (six) hours as needed.    . finasteride (PROSCAR) 5 MG tablet Take 1 tablet (5 mg total) by mouth daily. 90 tablet 3  . fluticasone (FLONASE) 50 MCG/ACT nasal spray Place 2 sprays into both nostrils daily. 16 g 6  .  mometasone (ELOCON) 0.1 % lotion     . rosuvastatin (CRESTOR) 10 MG tablet Take 1 tablet (10 mg total) by mouth daily. 90 tablet 0   No facility-administered medications prior to visit.     Review of Systems  Objective:  BP 108/70 (BP Location: Left Arm, Patient Position: Sitting, Cuff Size: Normal)   Pulse 60   Temp (!) 97 F (36.1 C) (Temporal)   Ht 5\' 9"  (1.753 m)   Wt 137 lb 3.2 oz (62.2 kg)   BMI 20.26 kg/m   Physical Exam   General appearance: alert, cooperative and appears stated age Ears: normal TM's and external ear canals both ears Throat: lips, mucosa, and tongue normal; teeth and gums normal Neck: no adenopathy, no carotid bruit, supple, symmetrical, trachea midline and thyroid not enlarged, symmetric, no tenderness/mass/nodules Back: symmetric, no curvature. ROM normal. No CVA tenderness. Lungs: clear to auscultation bilaterally Heart: regular rate and rhythm, S1, S2 normal, no murmur, click, rub or gallop Abdomen: soft, non-tender; bowel sounds normal; no masses,  no organomegaly Pulses: 2+ and symmetric Skin: Skin color, texture, turgor normal. No rashes or lesions Lymph nodes: Cervical, supraclavicular, and axillary nodes normal.    Assessment & Plan:   Problem List Items Addressed This Visit      Unprioritized   Hyperlipidemia, unspecified    Managed with Crestor due to elevation in risk to 16% using the FRC.   He has no side effects. Repeat labs due   Lab Results  Component Value Date   CHOL 170 04/28/2019   HDL 69.50 04/28/2019   LDLCALC 84 04/28/2019   LDLDIRECT 120.8 12/29/2013   TRIG 85.0 04/28/2019   CHOLHDL 2 04/28/2019   Lab Results  Component Value Date   ALT 22 04/28/2019   AST 15 04/28/2019   GGT 15 08/22/2016   ALKPHOS 53 04/28/2019   BILITOT 0.5 04/28/2019         Relevant Orders   Lipid panel   Abnormal thyroid blood test    Given his weight loss,  If TSH is suppressed will refer to endocrinology      Prediabetes    A  total of 25 minutes of face to face time was spent with patient more than half of which was spent in counselling about the hidden sugars in various foods he has been eating       Relevant Orders   Hemoglobin A1c    Other Visit Diagnoses    Fatigue, unspecified type    -  Primary   Relevant Orders   Comprehensive metabolic panel   TSH   CBC with Differential/Platelet      I am having Dellis Filbert A. Depaz maintain his acetaminophen, fluticasone, finasteride, mometasone, and rosuvastatin.  No orders of the defined types were placed in this encounter.   There are no discontinued medications.  Follow-up: No follow-ups on file.   [name], MD

## 2019-10-11 NOTE — Assessment & Plan Note (Signed)
A total of 25 minutes of face to face time was spent with patient more than half of which was spent in counselling about the hidden sugars in various foods he has been eating

## 2019-10-12 LAB — COMPREHENSIVE METABOLIC PANEL
ALT: 13 U/L (ref 0–53)
AST: 14 U/L (ref 0–37)
Albumin: 4.6 g/dL (ref 3.5–5.2)
Alkaline Phosphatase: 50 U/L (ref 39–117)
BUN: 17 mg/dL (ref 6–23)
CO2: 29 mEq/L (ref 19–32)
Calcium: 9.7 mg/dL (ref 8.4–10.5)
Chloride: 101 mEq/L (ref 96–112)
Creatinine, Ser: 0.92 mg/dL (ref 0.40–1.50)
GFR: 81.8 mL/min (ref >60.00)
Glucose, Bld: 97 mg/dL (ref 70–99)
Potassium: 4.3 mEq/L (ref 3.5–5.1)
Sodium: 139 mEq/L (ref 135–145)
Total Bilirubin: 0.6 mg/dL (ref 0.2–1.2)
Total Protein: 6.6 g/dL (ref 6.0–8.3)

## 2019-10-12 LAB — LIPID PANEL
Cholesterol: 162 mg/dL (ref 0–200)
HDL: 69.7 mg/dL (ref >39.00)
LDL Cholesterol: 77 mg/dL (ref 0–99)
NonHDL: 91.91
Total CHOL/HDL Ratio: 2
Triglycerides: 77 mg/dL (ref 0.0–149.0)
VLDL: 15.4 mg/dL (ref 0.0–40.0)

## 2019-10-12 LAB — CBC WITH DIFFERENTIAL/PLATELET
Basophils Absolute: 0 10*3/uL (ref 0.0–0.1)
Basophils Relative: 0.5 % (ref 0.0–3.0)
Eosinophils Absolute: 0.2 10*3/uL (ref 0.0–0.7)
Eosinophils Relative: 2.9 % (ref 0.0–5.0)
HCT: 46.7 % (ref 39.0–52.0)
Hemoglobin: 15.6 g/dL (ref 13.0–17.0)
Lymphocytes Relative: 27 % (ref 12.0–46.0)
Lymphs Abs: 2.1 10*3/uL (ref 0.7–4.0)
MCHC: 33.3 g/dL (ref 30.0–36.0)
MCV: 94 fl (ref 78.0–100.0)
Monocytes Absolute: 0.5 10*3/uL (ref 0.1–1.0)
Monocytes Relative: 6.5 % (ref 3.0–12.0)
Neutro Abs: 4.8 10*3/uL (ref 1.4–7.7)
Neutrophils Relative %: 63.1 % (ref 43.0–77.0)
Platelets: 269 10*3/uL (ref 150.0–400.0)
RBC: 4.97 Mil/uL (ref 4.22–5.81)
RDW: 14.3 % (ref 11.5–15.5)
WBC: 7.6 10*3/uL (ref 4.0–10.5)

## 2019-10-12 LAB — HEMOGLOBIN A1C: Hgb A1c MFr Bld: 5.8 % (ref 4.6–6.5)

## 2019-10-12 LAB — TSH: TSH: 1.12 u[IU]/mL (ref 0.35–4.50)

## 2019-10-14 ENCOUNTER — Other Ambulatory Visit: Payer: Self-pay | Admitting: Internal Medicine

## 2019-11-07 DIAGNOSIS — D2262 Melanocytic nevi of left upper limb, including shoulder: Secondary | ICD-10-CM | POA: Diagnosis not present

## 2019-11-07 DIAGNOSIS — L821 Other seborrheic keratosis: Secondary | ICD-10-CM | POA: Diagnosis not present

## 2019-11-07 DIAGNOSIS — D225 Melanocytic nevi of trunk: Secondary | ICD-10-CM | POA: Diagnosis not present

## 2019-11-07 DIAGNOSIS — Z08 Encounter for follow-up examination after completed treatment for malignant neoplasm: Secondary | ICD-10-CM | POA: Diagnosis not present

## 2019-11-07 DIAGNOSIS — D2261 Melanocytic nevi of right upper limb, including shoulder: Secondary | ICD-10-CM | POA: Diagnosis not present

## 2019-11-07 DIAGNOSIS — D2271 Melanocytic nevi of right lower limb, including hip: Secondary | ICD-10-CM | POA: Diagnosis not present

## 2019-11-07 DIAGNOSIS — Z85828 Personal history of other malignant neoplasm of skin: Secondary | ICD-10-CM | POA: Diagnosis not present

## 2019-11-07 DIAGNOSIS — D2272 Melanocytic nevi of left lower limb, including hip: Secondary | ICD-10-CM | POA: Diagnosis not present

## 2019-11-07 DIAGNOSIS — D485 Neoplasm of uncertain behavior of skin: Secondary | ICD-10-CM | POA: Diagnosis not present

## 2019-12-16 ENCOUNTER — Other Ambulatory Visit: Payer: Self-pay | Admitting: Family Medicine

## 2019-12-16 DIAGNOSIS — N138 Other obstructive and reflux uropathy: Secondary | ICD-10-CM

## 2019-12-16 DIAGNOSIS — Z87898 Personal history of other specified conditions: Secondary | ICD-10-CM

## 2019-12-19 ENCOUNTER — Other Ambulatory Visit: Payer: Medicare Other

## 2019-12-19 ENCOUNTER — Other Ambulatory Visit: Payer: Self-pay

## 2019-12-19 DIAGNOSIS — N138 Other obstructive and reflux uropathy: Secondary | ICD-10-CM

## 2019-12-19 DIAGNOSIS — Z87898 Personal history of other specified conditions: Secondary | ICD-10-CM | POA: Diagnosis not present

## 2019-12-19 DIAGNOSIS — N401 Enlarged prostate with lower urinary tract symptoms: Secondary | ICD-10-CM | POA: Diagnosis not present

## 2019-12-19 NOTE — Progress Notes (Signed)
12/20/2019  8:59 AM   Johnny Munoz November 10, 1951 [id number]  Referring provider: [name], MD Johnny Munoz,  [place] [address]  Chief Complaint  Patient presents with  . Benign Prostatic Hypertrophy    HPI: Johnny Munoz is a 69 y.o.  male with a personal history of elevated PSA and BPH with LU TS who presents today for follow up.  History of elevated PSA Underwent a prostate biopsy in 10/2011 following a PSA of 4.1 that was benign. He was followed by Dr. Zannie Munoz at [facility] at that time.   Component     Latest Ref Rng & Units 08/22/2016 12/10/2017 12/13/2018 12/19/2019  Prostate Specific Ag, Serum     0.0 - 4.0 ng/mL 1.0 0.9 0.8 1.3    BPH WITH LUTS  (prostate and/or bladder) IPSS score: 6/1    PVR: 99 mL   Previous score: 8/2   Previous PVR: 99 mL  Major complaint(s):  None.  Denies any dysuria, hematuria or suprapubic pain.  Currently taking: finasteride 5 mg daily   In 2007, he underwent a TUMT with Dr. Yves Munoz. His family history of PCa via a maternal uncle.  Denies any recent fevers, chills, nausea or vomiting.  IPSS    Row Name 12/20/19 0800         International Prostate Symptom Score   How often have you had the sensation of not emptying your bladder?  Less than 1 in 5     How often have you had to urinate less than every two hours?  Less than 1 in 5 times     How often have you found you stopped and started again several times when you urinated?  Less than 1 in 5 times     How often have you found it difficult to postpone urination?  Less than 1 in 5 times     How often have you had a weak urinary stream?  Less than 1 in 5 times     How often have you had to strain to start urination?  Not at All     How many times did you typically get up at night to urinate?  1 Time     Total IPSS Score  6       Quality of Life due to urinary symptoms   If you were to spend the rest of your life with your urinary condition just the  way it is now how would you feel about that?  Pleased        Score:  1-7 Mild 8-19 Moderate 20-35 Severe   PMH: Past Medical History:  Diagnosis Date  . Allergy   . Arthritis   . BPH (benign prostatic hyperplasia)   . Hyperlipidemia     Surgical History: Past Surgical History:  Procedure Laterality Date  . COLONOSCOPY  2003, 10/12/13   Dr. [name], Dr [name]  . Trans Urethral Microwave Therapy      Home Medications:  Allergies as of 12/20/2019   No Known Allergies     Medication List       Accurate as of December 20, 2019  8:59 AM. If you have any questions, ask your nurse or doctor.        acetaminophen 500 MG tablet Commonly known as: TYLENOL Take 500 mg by mouth every 6 (six) hours as needed.   finasteride 5 MG tablet Commonly known as: PROSCAR Take 1 tablet (5 mg total) by mouth daily.  fluticasone 50 MCG/ACT nasal spray Commonly known as: FLONASE Place 2 sprays into both nostrils daily.   mometasone 0.1 % lotion Commonly known as: ELOCON   rosuvastatin 10 MG tablet Commonly known as: CRESTOR TAKE ONE TABLET BY MOUTH DAILY      Allergies: No Known Allergies  Family History: Family History  Problem Relation Age of Onset  . Diabetes Father   . Heart disease Father        CABG after 9  . Prostate cancer Maternal Uncle   . Kidney cancer Neg Hx   . Bladder Cancer Neg Hx     Social History:  reports that he has never smoked. He has never used smokeless tobacco. He reports current alcohol use of about 3.0 standard drinks of alcohol per week. He reports that he does not use drugs.  ROS: UROLOGY Frequent Urination?: No Hard to postpone urination?: No Burning/pain with urination?: No Get up at night to urinate?: No Leakage of urine?: No Urine stream starts and stops?: No Trouble starting stream?: No Do you have to strain to urinate?: No Blood in urine?: No Urinary tract infection?: No Sexually transmitted disease?: No Injury to kidneys  or bladder?: No Painful intercourse?: No Weak stream?: No Erection problems?: No Penile pain?: No  Gastrointestinal Nausea?: No Vomiting?: No Indigestion/heartburn?: No Diarrhea?: No Constipation?: No  Constitutional Fever: No Night sweats?: No Weight loss?: No Fatigue?: No  Skin Skin rash/lesions?: No Itching?: No  Eyes Blurred vision?: No Double vision?: No  Ears/Nose/Throat Sore throat?: No Sinus problems?: Yes  Hematologic/Lymphatic Swollen glands?: No Easy bruising?: No  Cardiovascular Leg swelling?: No Chest pain?: No  Respiratory Cough?: No Shortness of breath?: No  Endocrine Excessive thirst?: No  Musculoskeletal Back pain?: No Joint pain?: No  Neurological Headaches?: No Dizziness?: No  Psychologic Depression?: No Anxiety?: No  Physical Exam: BP (!) 149/95   Pulse 81   Ht 5\' 9"  (1.753 m)   Wt 135 lb (61.2 kg)   BMI 19.94 kg/m   Constitutional:  Well nourished. Alert and oriented, No acute distress. HEENT: [place] AT, mask in place.  Trachea midline, no masses. Cardiovascular: No clubbing, cyanosis, or edema. Respiratory: Normal respiratory effort, no increased work of breathing. GI: Abdomen is soft, non tender, non distended, no abdominal masses. Liver and spleen not palpable.  No hernias appreciated.  Stool sample for occult testing is not indicated.   GU: No CVA tenderness.  No bladder fullness or masses.  Patient with circumcised phallus.  Urethral meatus is patent.  No penile discharge. No penile lesions or rashes. Scrotum without lesions, cysts, rashes and/or edema.  Testicles are located scrotally bilaterally. No masses are appreciated in the testicles. Left and right epididymis are normal.  Small hydrocele bilaterally.   Rectal: Patient with  normal sphincter tone. Anus and perineum without scarring or rashes. No rectal masses are appreciated. Prostate is approximately 55 grams, no nodules are appreciated. Seminal vesicles are  normal. Skin: No rashes, bruises or suspicious lesions. Lymph: No inguinal adenopathy. Neurologic: Grossly intact, no focal deficits, moving all 4 extremities. Psychiatric: Normal mood and affect.   Laboratory Data: Lab Results  Component Value Date   WBC 7.6 10/11/2019   HGB 15.6 10/11/2019   HCT 46.7 10/11/2019   MCV 94.0 10/11/2019   PLT 269.0 10/11/2019   Lab Results  Component Value Date   CREATININE 0.92 10/11/2019   Results for Novosad, Pesach A (MRN [id number]) as of 12/16/2018 08:54  Ref. Range 06/28/2013 09:13 03/06/2016 10:42  08/22/2016 08:15 08/27/2016 00:00 12/10/2017 10:59 12/13/2018 08:55  PSA Unknown 2.11 0.75  1.0    Prostate Specific Ag, Serum Latest Ref Range: 0.0 - 4.0 ng/mL   1.0  0.9 0.8   Lab Results  Component Value Date   HGBA1C 5.8 10/11/2019   Pertinent Imaging Results for Kulkarni, Abiel A (MRN [id number]) as of 12/20/2019 08:32  Ref. Range 12/16/2018 08:40  Scan Result Unknown 68ml    Assessment & Plan:    1. History of elevated PSA PSA stable 1.3 (2.6) Continue yearly PSA's and finasteride  2. BPH with LUTS IPSS score is 6/1, it is stable/improving/worsening Continue conservative management, avoiding bladder irritants and timed voiding's Continue finasteride 5 mg daily - refills given RTC in 12 months for IPSS, PSA, PVR and exam   Return in about 1 year (around 12/19/2020) for IPSS, PSA, PVR and exam.  Zara Council, [facility] [address] [place], [place] [address] [phone]

## 2019-12-20 ENCOUNTER — Encounter: Payer: Self-pay | Admitting: Urology

## 2019-12-20 ENCOUNTER — Other Ambulatory Visit: Payer: Self-pay | Admitting: Family Medicine

## 2019-12-20 ENCOUNTER — Ambulatory Visit (INDEPENDENT_AMBULATORY_CARE_PROVIDER_SITE_OTHER): Payer: Medicare Other | Admitting: Urology

## 2019-12-20 VITALS — BP 149/95 | HR 81 | Ht 69.0 in | Wt 135.0 lb

## 2019-12-20 DIAGNOSIS — Z87898 Personal history of other specified conditions: Secondary | ICD-10-CM

## 2019-12-20 DIAGNOSIS — N401 Enlarged prostate with lower urinary tract symptoms: Secondary | ICD-10-CM

## 2019-12-20 DIAGNOSIS — N138 Other obstructive and reflux uropathy: Secondary | ICD-10-CM | POA: Diagnosis not present

## 2019-12-20 LAB — BLADDER SCAN AMB NON-IMAGING: Scan Result: 48

## 2019-12-20 LAB — PSA: Prostate Specific Ag, Serum: 1.3 ng/mL (ref 0.0–4.0)

## 2019-12-20 MED ORDER — FINASTERIDE 5 MG PO TABS: 5.0000 mg | ORAL_TABLET | Freq: Every day | ORAL | 3 refills | Status: DC

## 2020-01-05 ENCOUNTER — Other Ambulatory Visit: Payer: Medicare Other

## 2020-01-14 ENCOUNTER — Other Ambulatory Visit: Payer: Self-pay | Admitting: Internal Medicine

## 2020-02-01 DIAGNOSIS — J309 Allergic rhinitis, unspecified: Secondary | ICD-10-CM | POA: Diagnosis not present

## 2020-02-01 DIAGNOSIS — H9311 Tinnitus, right ear: Secondary | ICD-10-CM | POA: Diagnosis not present

## 2020-02-01 DIAGNOSIS — H6123 Impacted cerumen, bilateral: Secondary | ICD-10-CM | POA: Diagnosis not present

## 2020-04-09 ENCOUNTER — Other Ambulatory Visit: Payer: Self-pay | Admitting: Internal Medicine

## 2020-07-17 ENCOUNTER — Ambulatory Visit (INDEPENDENT_AMBULATORY_CARE_PROVIDER_SITE_OTHER): Payer: Medicare Other

## 2020-07-17 ENCOUNTER — Other Ambulatory Visit: Payer: Self-pay

## 2020-07-17 ENCOUNTER — Encounter: Payer: Self-pay | Admitting: Podiatry

## 2020-07-17 ENCOUNTER — Ambulatory Visit (INDEPENDENT_AMBULATORY_CARE_PROVIDER_SITE_OTHER): Payer: Medicare Other | Admitting: Podiatry

## 2020-07-17 DIAGNOSIS — M722 Plantar fascial fibromatosis: Secondary | ICD-10-CM

## 2020-07-17 DIAGNOSIS — M7731 Calcaneal spur, right foot: Secondary | ICD-10-CM

## 2020-07-18 ENCOUNTER — Encounter: Payer: Self-pay | Admitting: Podiatry

## 2020-07-18 NOTE — Progress Notes (Signed)
  Subjective:  Patient ID: Johnny Munoz, male    DOB: 20-Feb-1951,  MRN: [id number]  Chief Complaint  Patient presents with  . Foot Pain    Patient presents today for right heel pain x 10 days    69 y.o. male presents with the above complaint.  Patient presents with right plantar fasciitis/heel pain.  Patient states that this has been going on for about 10 days has progressing on worse.  He states this feels like walking on a sort of bruise there is nagging pain.  Patient states it was worse the first few days.  Patient started talking in [place] salt Advil but has not helped.  Patient states the pain is worse when taking the first up in the morning.  He has not tried anything else.  He has not seen anyone else prior to seeing me.  He was last seen by Dr. Milinda Pointer for different reason.  He denies any other acute complaints.   Review of Systems: Negative except as noted in the HPI. Denies N/V/F/Ch.  Past Medical History:  Diagnosis Date  . Allergy   . Arthritis   . BPH (benign prostatic hyperplasia)   . Hyperlipidemia     Current Outpatient Medications:  .  acetaminophen (TYLENOL) 500 MG tablet, Take 500 mg by mouth every 6 (six) hours as needed., Disp: , Rfl:  .  finasteride (PROSCAR) 5 MG tablet, Take 1 tablet (5 mg total) by mouth daily., Disp: 90 tablet, Rfl: 3 .  fluticasone (FLONASE) 50 MCG/ACT nasal spray, Place 2 sprays into both nostrils daily., Disp: 16 g, Rfl: 6 .  rosuvastatin (CRESTOR) 10 MG tablet, TAKE ONE TABLET BY MOUTH DAILY, Disp: 90 tablet, Rfl: 0  Social History   Tobacco Use  Smoking Status Never Smoker  Smokeless Tobacco Never Used    No Known Allergies Objective:  There were no vitals filed for this visit. There is no height or weight on file to calculate BMI. Constitutional Well developed. Well nourished.  Vascular Dorsalis pedis pulses palpable bilaterally. Posterior tibial pulses palpable bilaterally. Capillary refill normal to all digits.  No  cyanosis or clubbing noted. Pedal hair growth normal.  Neurologic Normal speech. Oriented to person, place, and time. Epicritic sensation to light touch grossly present bilaterally.  Dermatologic Nails well groomed and normal in appearance. No open wounds. No skin lesions.  Orthopedic: Normal joint ROM without pain or crepitus bilaterally. No visible deformities. Tender to palpation at the calcaneal tuber right. No pain with calcaneal squeeze right. Ankle ROM full range of motion right. Silf[place]old Test: negative right.   Radiographs: Taken and reviewed. No acute fractures or dislocations. No evidence of stress fracture.  Plantar heel spur present. Posterior heel spur absent.   Assessment:   1. Plantar fasciitis of right foot   2. Heel spur, right    Plan:  Patient was evaluated and treated and all questions answered.  Plantar Fasciitis, right - XR reviewed as above.  - Educated on icing and stretching. Instructions given.  - Injection delivered to the plantar fascia as below. - DME: Plantar Fascial Brace - Pharmacologic management: None  Procedure: Injection Tendon/Ligament Location: Right plantar fascia at the glabrous junction; medial approach. Skin Prep: alcohol Injectate: 0.5 cc 0.5% marcaine plain, 0.5 cc of 1% Lidocaine, 0.5 cc kenalog 10. Disposition: Patient tolerated procedure well. Injection site dressed with a band-aid.  No follow-ups on file.

## 2020-07-29 ENCOUNTER — Other Ambulatory Visit: Payer: Self-pay | Admitting: Internal Medicine

## 2020-08-01 DIAGNOSIS — M25579 Pain in unspecified ankle and joints of unspecified foot: Secondary | ICD-10-CM | POA: Diagnosis not present

## 2020-08-01 DIAGNOSIS — H903 Sensorineural hearing loss, bilateral: Secondary | ICD-10-CM | POA: Diagnosis not present

## 2020-08-01 DIAGNOSIS — H6123 Impacted cerumen, bilateral: Secondary | ICD-10-CM | POA: Diagnosis not present

## 2020-08-14 ENCOUNTER — Ambulatory Visit: Payer: PRIVATE HEALTH INSURANCE | Admitting: Podiatry

## 2020-08-31 DIAGNOSIS — Z23 Encounter for immunization: Secondary | ICD-10-CM | POA: Diagnosis not present

## 2020-09-07 DIAGNOSIS — Z23 Encounter for immunization: Secondary | ICD-10-CM | POA: Diagnosis not present

## 2020-09-14 DIAGNOSIS — H524 Presbyopia: Secondary | ICD-10-CM | POA: Diagnosis not present

## 2020-09-14 DIAGNOSIS — H43811 Vitreous degeneration, right eye: Secondary | ICD-10-CM | POA: Diagnosis not present

## 2020-09-14 DIAGNOSIS — H5213 Myopia, bilateral: Secondary | ICD-10-CM | POA: Diagnosis not present

## 2020-09-14 DIAGNOSIS — H52223 Regular astigmatism, bilateral: Secondary | ICD-10-CM | POA: Diagnosis not present

## 2020-09-14 DIAGNOSIS — H35341 Macular cyst, hole, or pseudohole, right eye: Secondary | ICD-10-CM | POA: Diagnosis not present

## 2020-09-14 DIAGNOSIS — Z135 Encounter for screening for eye and ear disorders: Secondary | ICD-10-CM | POA: Diagnosis not present

## 2020-09-24 ENCOUNTER — Ambulatory Visit: Payer: Medicare Other

## 2020-10-01 ENCOUNTER — Ambulatory Visit (INDEPENDENT_AMBULATORY_CARE_PROVIDER_SITE_OTHER): Payer: Medicare Other

## 2020-10-01 VITALS — Ht 69.0 in | Wt 135.0 lb

## 2020-10-01 DIAGNOSIS — Z0000 Encounter for general adult medical examination without abnormal findings: Secondary | ICD-10-CM | POA: Diagnosis not present

## 2020-10-01 NOTE — Patient Instructions (Addendum)
Johnny Munoz , Thank you for taking time to come for your Medicare Wellness Visit. I appreciate your ongoing commitment to your health goals. Please review the following plan we discussed and let me know if I can assist you in the future.   These are the goals we discussed: Goals    . Maintain Healthy Lifestyle     Stay active Healthy diet       This is a list of the screening recommended for you and due dates:  Health Maintenance  Topic Date Due  . Pneumonia vaccines (2 of 2 - PPSV23) 08/25/2018  . Colon Cancer Screening  10/13/2023  . Tetanus Vaccine  07/31/2028  . Flu Shot  Completed  . COVID-19 Vaccine  Completed  .  Hepatitis C: One time screening is recommended by Center for Disease Control  (CDC) for  adults born from [birth year] through 1965.   Completed    Immunizations Immunization History  Administered Date(s) Administered  . Fluad Quad(high Dose 65+) 08/27/2019  . Influenza, High Dose Seasonal PF 08/25/2017, 09/07/2020  . Influenza,inj,Quad PF,6+ Mos 08/11/2014, 09/14/2015, 09/17/2018  . Influenza-Unspecified 08/31/2012  . PFIZER SARS-COV-2 Vaccination 01/13/2020, 02/07/2020, 08/31/2020  . Pneumococcal Conjugate-13 08/25/2017  . Td 12/30/2007  . Tdap 07/31/2018  . Zoster 10/10/2010   Keep all routine maintenance appointments.   Cpe 10/16/20 @ 2:30  Advanced directives: completed, on file  Conditions/risks identified: none new.   Follow up in one year for your annual wellness visit.   Preventive Care 24 Years and Older, Male Preventive care refers to lifestyle choices and visits with your health care provider that can promote health and wellness. What does preventive care include?  A yearly physical exam. This is also called an annual well check.  Dental exams once or twice a year.  Routine eye exams. Ask your health care provider how often you should have your eyes checked.  Personal lifestyle choices, including:  Daily care of your teeth and  gums.  Regular physical activity.  Eating a healthy diet.  Avoiding tobacco and drug use.  Limiting alcohol use.  Practicing safe sex.  Taking low doses of aspirin every day.  Taking vitamin and mineral supplements as recommended by your health care provider. What happens during an annual well check? The services and screenings done by your health care provider during your annual well check will depend on your age, overall health, lifestyle risk factors, and family history of disease. Counseling  Your health care provider may ask you questions about your:  Alcohol use.  Tobacco use.  Drug use.  Emotional well-being.  Home and relationship well-being.  Sexual activity.  Eating habits.  History of falls.  Memory and ability to understand (cognition).  Work and work Statistician. Screening  You may have the following tests or measurements:  Height, weight, and BMI.  Blood pressure.  Lipid and cholesterol levels. These may be checked every 5 years, or more frequently if you are over 16 years old.  Skin check.  Lung cancer screening. You may have this screening every year starting at age 22 if you have a 30-pack-year history of smoking and currently smoke or have quit within the past 15 years.  Fecal occult blood test (FOBT) of the stool. You may have this test every year starting at age 15.  Flexible sigmoidoscopy or colonoscopy. You may have a sigmoidoscopy every 5 years or a colonoscopy every 10 years starting at age 82.  Prostate cancer screening. Recommendations will vary depending  on your family history and other risks.  Hepatitis C blood test.  Hepatitis B blood test.  Sexually transmitted disease (STD) testing.  Diabetes screening. This is done by checking your blood sugar (glucose) after you have not eaten for a while (fasting). You may have this done every 1-3 years.  Abdominal aortic aneurysm (AAA) screening. You may need this if you are a  current or former smoker.  Osteoporosis. You may be screened starting at age 69 if you are at high risk. Talk with your health care provider about your test results, treatment options, and if necessary, the need for more tests. Vaccines  Your health care provider may recommend certain vaccines, such as:  Influenza vaccine. This is recommended every year.  Tetanus, diphtheria, and acellular pertussis (Tdap, Td) vaccine. You may need a Td booster every 10 years.  Zoster vaccine. You may need this after age 53.  Pneumococcal 13-valent conjugate (PCV13) vaccine. One dose is recommended after age 5.  Pneumococcal polysaccharide (PPSV23) vaccine. One dose is recommended after age 33. Talk to your health care provider about which screenings and vaccines you need and how often you need them. This information is not intended to replace advice given to you by your health care provider. Make sure you discuss any questions you have with your health care provider. Document Released: 12/14/2015 Document Revised: 08/06/2016 Document Reviewed: 09/18/2015 Elsevier Interactive Patient Education  2017 [place] Prevention in the Home Falls can cause injuries. They can happen to people of all ages. There are many things you can do to make your home safe and to help prevent falls. What can I do on the outside of my home?  Regularly fix the edges of walkways and driveways and fix any cracks.  Remove anything that might make you trip as you walk through a door, such as a raised step or threshold.  Trim any bushes or trees on the path to your home.  Use bright outdoor lighting.  Clear any walking paths of anything that might make someone trip, such as rocks or tools.  Regularly check to see if handrails are loose or broken. Make sure that both sides of any steps have handrails.  Any raised decks and porches should have guardrails on the edges.  Have any leaves, snow, or ice cleared  regularly.  Use sand or salt on walking paths during winter.  Clean up any spills in your garage right away. This includes oil or grease spills. What can I do in the bathroom?  Use night lights.  Install grab bars by the toilet and in the tub and shower. Do not use towel bars as grab bars.  Use non-skid mats or decals in the tub or shower.  If you need to sit down in the shower, use a plastic, non-slip stool.  Keep the floor dry. Clean up any water that spills on the floor as soon as it happens.  Remove soap buildup in the tub or shower regularly.  Attach bath mats securely with double-sided non-slip rug tape.  Do not have throw rugs and other things on the floor that can make you trip. What can I do in the bedroom?  Use night lights.  Make sure that you have a light by your bed that is easy to reach.  Do not use any sheets or blankets that are too big for your bed. They should not hang down onto the floor.  Have a firm chair that has side arms.  You can use this for support while you get dressed.  Do not have throw rugs and other things on the floor that can make you trip. What can I do in the kitchen?  Clean up any spills right away.  Avoid walking on wet floors.  Keep items that you use a lot in easy-to-reach places.  If you need to reach something above you, use a strong step stool that has a grab bar.  Keep electrical cords out of the way.  Do not use floor polish or wax that makes floors slippery. If you must use wax, use non-skid floor wax.  Do not have throw rugs and other things on the floor that can make you trip. What can I do with my stairs?  Do not leave any items on the stairs.  Make sure that there are handrails on both sides of the stairs and use them. Fix handrails that are broken or loose. Make sure that handrails are as long as the stairways.  Check any carpeting to make sure that it is firmly attached to the stairs. Fix any carpet that is loose  or worn.  Avoid having throw rugs at the top or bottom of the stairs. If you do have throw rugs, attach them to the floor with carpet tape.  Make sure that you have a light switch at the top of the stairs and the bottom of the stairs. If you do not have them, ask someone to add them for you. What else can I do to help prevent falls?  Wear shoes that:  Do not have high heels.  Have rubber bottoms.  Are comfortable and fit you well.  Are closed at the toe. Do not wear sandals.  If you use a stepladder:  Make sure that it is fully opened. Do not climb a closed stepladder.  Make sure that both sides of the stepladder are locked into place.  Ask someone to hold it for you, if possible.  Clearly mark and make sure that you can see:  Any grab bars or handrails.  First and last steps.  Where the edge of each step is.  Use tools that help you move around (mobility aids) if they are needed. These include:  Canes.  Walkers.  Scooters.  Crutches.  Turn on the lights when you go into a dark area. Replace any light bulbs as soon as they burn out.  Set up your furniture so you have a clear path. Avoid moving your furniture around.  If any of your floors are uneven, fix them.  If there are any pets around you, be aware of where they are.  Review your medicines with your doctor. Some medicines can make you feel dizzy. This can increase your chance of falling. Ask your doctor what other things that you can do to help prevent falls. This information is not intended to replace advice given to you by your health care provider. Make sure you discuss any questions you have with your health care provider. Document Released: 09/13/2009 Document Revised: 04/24/2016 Document Reviewed: 12/22/2014 Elsevier Interactive Patient Education  2017 [employer].

## 2020-10-01 NOTE — Progress Notes (Addendum)
Subjective:   Johnny Munoz is a 70 y.o. male who presents for Medicare Annual/Subsequent preventive examination.  Review of Systems    No ROS.  Medicare Wellness Virtual Visit.   Cardiac Risk Factors include: advanced age (>66men, >31 women);male gender     Objective:    Today's Vitals   10/01/20 1114  Weight: 135 lb (61.2 kg)  Height: 5\' 9"  (1.753 m)   Body mass index is 19.94 kg/m.  Advanced Directives 10/01/2020 09/22/2019 09/17/2018 07/31/2018  Does Patient Have a Medical Advance Directive? Yes Yes Yes Yes  Type of Paramedic of [place];Living will [place];Living will [place];Living will [place]  Does patient want to make changes to medical advance directive? No - Patient declined No - Patient declined No - Patient declined No - Patient declined  Copy of [place] in Chart? Yes - validated most recent copy scanned in chart (See row information) No - copy requested No - copy requested No - copy requested  Would patient like information on creating a medical advance directive? - - - No - Patient declined    Current Medications (verified) Outpatient Encounter Medications as of 10/01/2020  Medication Sig   acetaminophen (TYLENOL) 500 MG tablet Take 500 mg by mouth every 6 (six) hours as needed.   finasteride (PROSCAR) 5 MG tablet Take 1 tablet (5 mg total) by mouth daily.   fluticasone (FLONASE) 50 MCG/ACT nasal spray Place 2 sprays into both nostrils daily.   rosuvastatin (CRESTOR) 10 MG tablet TAKE ONE TABLET BY MOUTH DAILY   No facility-administered encounter medications on file as of 10/01/2020.    Allergies (verified) Patient has no known allergies.   History: Past Medical History:  Diagnosis Date   Allergy    Arthritis    BPH (benign prostatic hyperplasia)    Hyperlipidemia    Past Surgical History:  Procedure Laterality Date   COLONOSCOPY  2003,  10/12/13   Dr. [name], Dr Hermine Messick Urethral Microwave Therapy     Family History  Problem Relation Age of Onset   Diabetes Father    Heart disease Father        CABG after 98   Prostate cancer Maternal Uncle    Kidney cancer Neg Hx    Bladder Cancer Neg Hx    Social History   Socioeconomic History   Marital status: Married    Spouse name: Not on file   Number of children: Not on file   Years of education: 16   Highest education level: Not on file  Occupational History   Occupation: Customer Service    Employer: [employer]   Occupation: Retired  Tobacco Use   Smoking status: Never Smoker   Smokeless tobacco: Never Used  Scientific laboratory technician Use: Never used  Substance and Sexual Activity   Alcohol use: Yes    Alcohol/week: 3.0 standard drinks    Types: 3 drink(s) per week   Drug use: No   Sexual activity: Not on file  Other Topics Concern   Not on file  Social History Narrative   Regular exercise-yes   Caffeine Use-yes         Social Determinants of Health   Financial Resource Strain: Low Risk    Difficulty of Paying Living Expenses: Not hard at all  Food Insecurity: No Food Insecurity   Worried About [place] in the Last Year:  Never true   Ran Out of Food in the Last Year: Never true  Transportation Needs: No Transportation Needs   Lack of Transportation (Medical): No   Lack of Transportation (Non-Medical): No  Physical Activity:    Days of Exercise per Week: Not on file   Minutes of Exercise per Session: Not on file  Stress: No Stress Concern Present   Feeling of Stress : Not at all  Social Connections: Unknown   Frequency of Communication with Friends and Family: Not on file   Frequency of Social Gatherings with Friends and Family: Not on file   Attends Religious Services: Not on Electrical engineer or Organizations: Not on file   Attends Archivist Meetings: Not on file   Marital Status: Married     Tobacco Counseling Counseling given: Not Answered   Clinical Intake:  Pre-visit preparation completed: Yes        Diabetes: No  How often do you need to have someone help you when you read instructions, pamphlets, or other written materials from your doctor or pharmacy?: 1 - Never    Interpreter Needed?: No      Activities of Daily Living In your present state of health, do you have any difficulty performing the following activities: 10/01/2020  Hearing? N  Vision? N  Difficulty concentrating or making decisions? N  Walking or climbing stairs? N  Dressing or bathing? N  Doing errands, shopping? N  Preparing Food and eating ? N  Using the Toilet? N  In the past six months, have you accidently leaked urine? N  Do you have problems with loss of bowel control? N  Managing your Medications? N  Managing your Finances? N  Housekeeping or managing your Housekeeping? N  Some recent data might be hidden    Patient Care Team: [name], MD as PCP - General (Internal Medicine) [name], Forest Gleason, MD (General Surgery) [name], MD (Internal Medicine) Christene Lye, MD (General Surgery) [name], MD (Internal Medicine)  Indicate any recent Medical Services you may have received from other than [facility] providers in the past year (date may be approximate).     Assessment:   This is a routine wellness examination for Johnny Munoz.  I connected with Johnny Munoz today by telephone and verified that I am speaking with the correct person using two identifiers. Location patient: home Location provider: work Persons participating in the virtual visit: patient, Marine scientist.    I discussed the limitations, risks, security and privacy concerns of performing an evaluation and management service by telephone and the availability of in person appointments. The patient expressed understanding and verbally consented to this telephonic visit.    Interactive audio and  video telecommunications were attempted between this provider and patient, however failed, due to patient having technical difficulties OR patient did not have access to video capability.  We continued and completed visit with audio only.  Some vital signs may be absent or patient reported.   Hearing/Vision screen  Hearing Screening   125Hz  250Hz  500Hz  1000Hz  2000Hz  3000Hz  4000Hz  6000Hz  8000Hz   Right ear:           Left ear:           Comments: Patient is able to hear conversational tones without difficulty. No issues reported.  Vision Screening Comments: Follow[facility]  Wears corrective lenses  Annual visits  Dietary issues and exercise activities discussed: Current Exercise Habits: Home exercise routine,  Type of exercise: walking, Intensity: Mild Healthy diet Good water intake  Goals      Maintain Healthy Lifestyle     Stay active Healthy diet       Depression Screen PHQ 2/9 Scores 10/01/2020 10/11/2019 09/22/2019 09/17/2018 08/25/2017  PHQ - 2 Score 0 0 0 0 0  PHQ- 9 Score - - - 1 1    Fall Risk Fall Risk  10/01/2020 10/11/2019 09/22/2019 08/25/2017  Falls in the past year? 0 0 0 No  Number falls in past yr: 0 - - -  Follow up Falls evaluation completed Falls evaluation completed - -   Handrails in use when climbing stairs? Yes Home free of loose throw rugs in walkways, pet beds, electrical cords, etc? Yes  Adequate lighting in your home to reduce risk of falls? Yes   ASSISTIVE DEVICES UTILIZED TO PREVENT FALLS: Life alert? No  Use of a cane, walker or w/c? No   TIMED UP AND GO: Was the test performed? No . Virtual visit.   Cognitive Function: Patient is alert and oriented x3.  Denies difficulty focusing, making decisions, memory loss.  Church Therapist, nutritional.  MMSE/6CIT deferred. Normal by direct communication/observation.   MMSE - Mini Mental State Exam 09/17/2018  Orientation to time 5  Orientation to Place 5  Registration 3  Attention/ Calculation  5  Recall 3  Language- name 2 objects 2  Language- repeat 1  Language- follow 3 step command 3  Language- read & follow direction 1  Write a sentence 1  Copy design 1  Total score 30     6CIT Screen 09/22/2019  What Year? 0 points  What month? 0 points  What time? 0 points  Count back from 20 0 points  Months in reverse 0 points  Repeat phrase 0 points  Total Score 0    Immunizations Immunization History  Administered Date(s) Administered   Fluad Quad(high Dose 65+) 08/27/2019   Influenza, High Dose Seasonal PF 08/25/2017, 09/07/2020   Influenza,inj,Quad PF,6+ Mos 08/11/2014, 09/14/2015, 09/17/2018   Influenza-Unspecified 08/31/2012   PFIZER SARS-COV-2 Vaccination 01/13/2020, 02/07/2020, 08/31/2020   Pneumococcal Conjugate-13 08/25/2017   Td 12/30/2007   Tdap 07/31/2018   Zoster 10/10/2010  Pneumococcal vaccine status: Declined,  Education has been provided regarding the importance of this vaccine but patient still declined. Advised may receive this vaccine at local pharmacy or Health Dept. Aware to provide a copy of the vaccination record if obtained from local pharmacy or Health Dept. Verbalized acceptance and understanding.  Deferred for follow up with pcp next office visit 10/16/20, per patient preference.   Health Maintenance Health Maintenance  Topic Date Due   PNA vac Low Risk Adult (2 of 2 - PPSV23) 08/25/2018   COLONOSCOPY  10/13/2023   TETANUS/TDAP  07/31/2028   INFLUENZA VACCINE  Completed   COVID-19 Vaccine  Completed   Hepatitis C Screening  Completed   Colorectal cancer screening: Completed 10/12/13. Repeat every 10 years  Lung Cancer Screening: (Low Dose CT Chest recommended if Age 4-80 years, 30 pack-year currently smoking OR have quit w/in 15years.) does not qualify.   Hepatitis C Screening:  Completed 03/06/16.  Vision Screening: Recommended annual ophthalmology exams for early detection of glaucoma and other disorders of the eye. Is the patient up  to date with their annual eye exam?  Yes  Who is the provider or what is the name of the office in which the patient attends annual eye exams? Dr. Matilde Sprang  Dental Screening: Recommended annual  dental exams for proper oral hygiene. Visits every 6 months.   Community Resource Referral / Chronic Care Management: CRR required this visit?  No   CCM required this visit?  No      Plan:   Keep all routine maintenance appointments.   Cpe 10/16/20 @ 2:30  I have personally reviewed and noted the following in the patient's chart:   Medical and social history Use of alcohol, tobacco or illicit drugs  Current medications and supplements Functional ability and status Nutritional status Physical activity Advanced directives List of other physicians Hospitalizations, surgeries, and ER visits in previous 12 months Vitals Screenings to include cognitive, depression, and falls Referrals and appointments  In addition, I have reviewed and discussed with patient certain preventive protocols, quality metrics, and best practice recommendations. A written personalized care plan for preventive services as well as general preventive health recommendations were provided to patient via mychart.     OBrien-Blaney, Serrina Minogue L, LPN   [id number]/12/[id number]    I have reviewed the above information and agree with above.   Deborra Medina, MD

## 2020-10-02 ENCOUNTER — Ambulatory Visit: Payer: Medicare Other

## 2020-10-12 ENCOUNTER — Ambulatory Visit: Payer: Medicare Other | Admitting: Internal Medicine

## 2020-10-15 ENCOUNTER — Telehealth: Payer: Self-pay | Admitting: Internal Medicine

## 2020-10-15 DIAGNOSIS — H35371 Puckering of macula, right eye: Secondary | ICD-10-CM | POA: Diagnosis not present

## 2020-10-15 DIAGNOSIS — H43391 Other vitreous opacities, right eye: Secondary | ICD-10-CM | POA: Diagnosis not present

## 2020-10-15 DIAGNOSIS — H35341 Macular cyst, hole, or pseudohole, right eye: Secondary | ICD-10-CM | POA: Diagnosis not present

## 2020-10-15 DIAGNOSIS — H43822 Vitreomacular adhesion, left eye: Secondary | ICD-10-CM | POA: Diagnosis not present

## 2020-10-15 NOTE — Telephone Encounter (Signed)
Patient called in wanted to know if he needs to have labs and if they are fasting labs if so his appointment is at 2:30 pm on 10-16-2020 need a call back so he will know.

## 2020-10-16 ENCOUNTER — Ambulatory Visit (INDEPENDENT_AMBULATORY_CARE_PROVIDER_SITE_OTHER): Payer: Medicare Other | Admitting: Internal Medicine

## 2020-10-16 ENCOUNTER — Other Ambulatory Visit: Payer: Self-pay

## 2020-10-16 ENCOUNTER — Encounter: Payer: Self-pay | Admitting: Internal Medicine

## 2020-10-16 VITALS — BP 126/66 | HR 50 | Temp 98.6°F | Resp 15 | Ht 69.0 in | Wt 139.6 lb

## 2020-10-16 DIAGNOSIS — F4321 Adjustment disorder with depressed mood: Secondary | ICD-10-CM | POA: Diagnosis not present

## 2020-10-16 DIAGNOSIS — R5383 Other fatigue: Secondary | ICD-10-CM

## 2020-10-16 DIAGNOSIS — R7303 Prediabetes: Secondary | ICD-10-CM

## 2020-10-16 DIAGNOSIS — Z87898 Personal history of other specified conditions: Secondary | ICD-10-CM

## 2020-10-16 DIAGNOSIS — R7989 Other specified abnormal findings of blood chemistry: Secondary | ICD-10-CM | POA: Diagnosis not present

## 2020-10-16 DIAGNOSIS — E785 Hyperlipidemia, unspecified: Secondary | ICD-10-CM | POA: Diagnosis not present

## 2020-10-16 DIAGNOSIS — Z1211 Encounter for screening for malignant neoplasm of colon: Secondary | ICD-10-CM

## 2020-10-16 DIAGNOSIS — N401 Enlarged prostate with lower urinary tract symptoms: Secondary | ICD-10-CM

## 2020-10-16 DIAGNOSIS — R3911 Hesitancy of micturition: Secondary | ICD-10-CM | POA: Diagnosis not present

## 2020-10-16 MED ORDER — ROSUVASTATIN CALCIUM 10 MG PO TABS
10.0000 mg | ORAL_TABLET | Freq: Every day | ORAL | 3 refills | Status: DC
Start: 2020-10-16 — End: 2021-10-16

## 2020-10-16 MED ORDER — AMOXICILLIN-POT CLAVULANATE 875-125 MG PO TABS: 1.0000 | ORAL_TABLET | Freq: Two times a day (BID) | ORAL | 0 refills | Status: DC

## 2020-10-16 MED ORDER — ALPRAZOLAM 0.25 MG PO TABS: 0.2500 mg | ORAL_TABLET | Freq: Two times a day (BID) | ORAL | 0 refills | Status: DC | PRN

## 2020-10-16 NOTE — Assessment & Plan Note (Addendum)
Managed with Crestor since 2018  due to elevation in risk to 16% using the FRC.

## 2020-10-16 NOTE — Assessment & Plan Note (Signed)
He has restricted his diet rather severely and even stopped his one glass of red wine nightly.  Advised him to follow a Mediterranean diet/lifestyle

## 2020-10-16 NOTE — Assessment & Plan Note (Signed)
Secondary to BPH, managed with proscar.  Follow up with Urology every 6 months  

## 2020-10-16 NOTE — Assessment & Plan Note (Signed)
He is grieving the loss of his best friend to [place], as well as the recent loss of his step mother.  He admits to feeling depressed but is not suicidal and defers counselling and pharmacotherapy

## 2020-10-16 NOTE — Assessment & Plan Note (Signed)
Secondary to BPH, managed with proscar by Urology.   Normal by jan 2021 test  Lab Results  Component Value Date   PSA1 1.3 12/19/2019   PSA1 0.8 12/13/2018   PSA1 0.9 12/10/2017   PSA 1.0 08/27/2016   PSA 0.75 03/06/2016   PSA 2.11 06/28/2013

## 2020-10-16 NOTE — Telephone Encounter (Signed)
Spoke with pt to let him know that he will be due for fasting labs at his visit. Pt was advised that he only needing to be fasting for 4 hours before his appt. Pt gave a verbal understanding.

## 2020-10-16 NOTE — Progress Notes (Signed)
Patient ID: Johnny Munoz, male    DOB: September 19, 1951  Age: 69 y.o. MRN: [id number]  The patient is here for annual follow up and management of other chronic and acute problems.   The risk factors are reflected in the social history.  The roster of all physicians providing medical care to patient - is listed in the Snapshot section of the chart.  Activities of daily living:  The patient is 100% independent in all ADLs: dressing, toileting, feeding as well as independent mobility  Home safety : The patient has smoke detectors in the home. They wear seatbelts.  There are no firearms at home. There is no violence in the home.   There is no risks for hepatitis, STDs or HIV. There is no   history of blood transfusion. They have no travel history to infectious disease endemic areas of the world.  The patient has seen their dentist in the last six month. They have seen their eye doctor in the last year. They admit to slight hearing difficulty with regard to whispered voices and some television programs.  They have deferred audiologic testing in the last year.  They do not  have excessive sun exposure. Discussed the need for sun protection: hats, long sleeves and use of sunscreen if there is significant sun exposure.   Diet: the importance of a healthy diet is discussed. They do have a healthy diet.  The benefits of regular aerobic exercise were discussed. He usually walks 5 miles daily but his PF has been preventing it .   Depression screen: there are no signs or vegative symptoms of depression- irritability, change in appetite, anhedonia, sadness/tearfullness.  Cognitive assessment: the patient manages all their financial and personal affairs and is actively engaged. They could relate day,date,year and events; recalled 2/3 objects at 3 minutes; performed clock-face test normally.  The following portions of the patient's history were reviewed and updated as appropriate: allergies, current  medications, past family history, past medical history,  past surgical history, past social history  and problem list.  Visual acuity was not assessed per patient preference since she has regular follow up with her ophthalmologist. Hearing and body mass index were assessed and reviewed.   During the course of the visit the patient was educated and counseled about appropriate screening and preventive services including : fall prevention , diabetes screening, nutrition counseling, colorectal cancer screening, and recommended immunizations.    CC: The primary encounter diagnosis was Prediabetes. Diagnoses of History of elevated PSA, Hyperlipidemia, unspecified hyperlipidemia type, Colon cancer screening, Abnormal thyroid blood test, Fatigue, unspecified type, Grief, and Benign prostatic hyperplasia with urinary hesitancy were also pertinent to this visit.  1) stressed out;  Has a vitrectomy planned for Thursday due to retinal tear. Will have to spend a week face down with breaks in position only for showering and eating   2) seasonal allergies :  Has had headache,  Sinus drainage for the past week,  treating with mucinex  And having rhinitis that is blood streaked at times.  No facial pain. Using saline prn.  3) grief:  He is mourning the loss of his best friend to covid  [name] has a past medical history of Allergy, Arthritis, BPH (benign prostatic hyperplasia), and Hyperlipidemia.   He has a past surgical history that includes Trans Urethral Microwave Therapy and Colonoscopy (2003, 10/12/13).   His family history includes Diabetes in his father; Heart disease in his father; Prostate cancer in his maternal uncle.He reports that  he has never smoked. He has never used smokeless tobacco. He reports current alcohol use of about 3.0 standard drinks of alcohol per week. He reports that he does not use drugs.  Outpatient Medications Prior to Visit  Medication Sig Dispense Refill  .  acetaminophen (TYLENOL) 500 MG tablet Take 500 mg by mouth every 6 (six) hours as needed.    . finasteride (PROSCAR) 5 MG tablet Take 1 tablet (5 mg total) by mouth daily. 90 tablet 3  . fluticasone (FLONASE) 50 MCG/ACT nasal spray Place 2 sprays into both nostrils daily. 16 g 6  . rosuvastatin (CRESTOR) 10 MG tablet TAKE ONE TABLET BY MOUTH DAILY 90 tablet 0  . meloxicam (MOBIC) 15 MG tablet Take 15 mg by mouth daily.     No facility-administered medications prior to visit.    Review of Systems   Patient denies headache, fevers, malaise, unintentional weight loss, skin rash, eye pain, sinus congestion and sinus pain, sore throat, dysphagia,  hemoptysis , cough, dyspnea, wheezing, chest pain, palpitations, orthopnea, edema, abdominal pain, nausea, melena, diarrhea, constipation, flank pain, dysuria, hematuria, urinary  Frequency, nocturia, numbness, tingling, seizures,  Focal weakness, Loss of consciousness,  Tremor, insomnia, depression, anxiety, and suicidal ideation.      Objective:  BP 126/66 (BP Location: Left Arm, Patient Position: Sitting, Cuff Size: Normal)   Pulse (!) 50   Temp 98.6 F (37 C) (Oral)   Resp 15   Ht 5\' 9"  (1.753 m)   Wt 139 lb 9.6 oz (63.3 kg)   SpO2 98%   BMI 20.62 kg/m   Physical Exam  General appearance: alert, cooperative and appears stated age Ears: normal TM's and external ear canals both ears Throat: lips, mucosa, and tongue normal; teeth and gums normal Neck: no adenopathy, no carotid bruit, supple, symmetrical, trachea midline and thyroid not enlarged, symmetric, no tenderness/mass/nodules Back: symmetric, no curvature. ROM normal. No CVA tenderness. Lungs: clear to auscultation bilaterally Heart: regular rate and rhythm, S1, S2 normal, no murmur, click, rub or gallop Abdomen: soft, non-tender; bowel sounds normal; no masses,  no organomegaly Pulses: 2+ and symmetric Skin: Skin color, texture, turgor normal. No rashes or lesions Lymph nodes:  Cervical, supraclavicular, and axillary nodes normal.  Assessment & Plan:   Problem List Items Addressed This Visit      Unprioritized   RESOLVED: Abnormal thyroid blood test   Colon cancer screening    Due in 2024       Enlarged prostate with lower urinary tract symptoms (LUTS)    Secondary to BPH, managed with proscar.  Follow up with Urology every 6 months       Grief    He is grieving the loss of his best friend to [place], as well as the recent loss of his step mother.  He admits to feeling depressed but is not suicidal and defers counselling and pharmacotherapy      History of elevated PSA    Secondary to BPH, managed with proscar by Urology.   Normal by jan 2021 test  Lab Results  Component Value Date   PSA1 1.3 12/19/2019   PSA1 0.8 12/13/2018   PSA1 0.9 12/10/2017   PSA 1.0 08/27/2016   PSA 0.75 03/06/2016   PSA 2.11 06/28/2013          Hyperlipidemia, unspecified    Managed with Crestor since 2018  due to elevation in risk to 16% using the FRC.        Relevant Medications  rosuvastatin (CRESTOR) 10 MG tablet   Other Relevant Orders   Lipid panel   Prediabetes - Primary    He has restricted his diet rather severely and even stopped his one glass of red wine nightly.  Advised him to follow a Mediterranean diet/lifestyle      Relevant Orders   Comprehensive metabolic panel   Hemoglobin A1c    Other Visit Diagnoses    Fatigue, unspecified type       Relevant Orders   TSH      I have changed Johnny Munoz's rosuvastatin. I am also having him start on ALPRAZolam and amoxicillin-clavulanate. Additionally, I am having him maintain his acetaminophen, fluticasone, finasteride, and meloxicam.  Meds ordered this encounter  Medications  . rosuvastatin (CRESTOR) 10 MG tablet    Sig: Take 1 tablet (10 mg total) by mouth daily.    Dispense:  90 tablet    Refill:  3  . ALPRAZolam (XANAX) 0.25 MG tablet    Sig: Take 1 tablet (0.25 mg total) by mouth  2 (two) times daily as needed for anxiety.    Dispense:  20 tablet    Refill:  0  . amoxicillin-clavulanate (AUGMENTIN) 875-125 MG tablet    Sig: Take 1 tablet by mouth 2 (two) times daily.    Dispense:  14 tablet    Refill:  0    Medications Discontinued During This Encounter  Medication Reason  . rosuvastatin (CRESTOR) 10 MG tablet Reorder    Follow-up: No follow-ups on file.   [name], MD

## 2020-10-16 NOTE — Assessment & Plan Note (Addendum)
Due in 2024

## 2020-10-16 NOTE — Patient Instructions (Addendum)
  I have prescribed a very low dose of alprazolam ("xanax") for you to use IF NEEDED during your week of rest.  Ok to resume a glass of red wine nightly (just don't  Take the xanax with the wine:  Separate by 2 hours minimum)  For your sinuses:  Start using afrin and Flonase twice daily for the congrestion.  Flush with salt water before you spray  Add humidity to your bedroom

## 2020-10-17 LAB — COMPREHENSIVE METABOLIC PANEL
ALT: 13 U/L (ref 0–53)
AST: 15 U/L (ref 0–37)
Albumin: 4.5 g/dL (ref 3.5–5.2)
Alkaline Phosphatase: 51 U/L (ref 39–117)
BUN: 17 mg/dL (ref 6–23)
CO2: 30 mEq/L (ref 19–32)
Calcium: 9.4 mg/dL (ref 8.4–10.5)
Chloride: 101 mEq/L (ref 96–112)
Creatinine, Ser: 0.92 mg/dL (ref 0.40–1.50)
GFR: 85.14 mL/min (ref >60.00)
Glucose, Bld: 104 mg/dL — ABNORMAL HIGH (ref 70–99)
Potassium: 4.7 mEq/L (ref 3.5–5.1)
Sodium: 139 mEq/L (ref 135–145)
Total Bilirubin: 0.5 mg/dL (ref 0.2–1.2)
Total Protein: 6.6 g/dL (ref 6.0–8.3)

## 2020-10-17 LAB — HEMOGLOBIN A1C: Hgb A1c MFr Bld: 5.9 % (ref 4.6–6.5)

## 2020-10-17 LAB — LIPID PANEL
Cholesterol: 162 mg/dL (ref 0–200)
HDL: 73.7 mg/dL (ref >39.00)
LDL Cholesterol: 75 mg/dL (ref 0–99)
NonHDL: 88.33
Total CHOL/HDL Ratio: 2
Triglycerides: 69 mg/dL (ref 0.0–149.0)
VLDL: 13.8 mg/dL (ref 0.0–40.0)

## 2020-10-17 LAB — TSH: TSH: 1.32 u[IU]/mL (ref 0.35–4.50)

## 2020-10-18 DIAGNOSIS — Z48810 Encounter for surgical aftercare following surgery on the sense organs: Secondary | ICD-10-CM | POA: Diagnosis not present

## 2020-10-18 DIAGNOSIS — H35341 Macular cyst, hole, or pseudohole, right eye: Secondary | ICD-10-CM | POA: Diagnosis not present

## 2020-10-18 DIAGNOSIS — H35411 Lattice degeneration of retina, right eye: Secondary | ICD-10-CM | POA: Diagnosis not present

## 2020-10-19 DIAGNOSIS — H33311 Horseshoe tear of retina without detachment, right eye: Secondary | ICD-10-CM | POA: Diagnosis not present

## 2020-10-19 NOTE — Progress Notes (Signed)
Your cholesterol ,  thyroid , liver and kidney function are normal.  A1c is essentially unchanged. . Please Plan to repeat fasting labs in 6 months.   Regards,  Dr. Derrel Nip

## 2020-10-30 DIAGNOSIS — H35341 Macular cyst, hole, or pseudohole, right eye: Secondary | ICD-10-CM | POA: Diagnosis not present

## 2020-11-06 ENCOUNTER — Ambulatory Visit (INDEPENDENT_AMBULATORY_CARE_PROVIDER_SITE_OTHER): Payer: Medicare Other | Admitting: Podiatry

## 2020-11-06 ENCOUNTER — Encounter: Payer: Self-pay | Admitting: Podiatry

## 2020-11-06 ENCOUNTER — Other Ambulatory Visit: Payer: Self-pay

## 2020-11-06 DIAGNOSIS — M7731 Calcaneal spur, right foot: Secondary | ICD-10-CM

## 2020-11-06 DIAGNOSIS — M722 Plantar fascial fibromatosis: Secondary | ICD-10-CM

## 2020-11-06 NOTE — Progress Notes (Signed)
Subjective:  Patient ID: Johnny Munoz, male    DOB: 11/01/1951,  MRN: [id number]  Chief Complaint  Patient presents with  . Plantar Fasciitis    "its a little better but still have an annoying pain"    69 y.o. male presents with the above complaint.  Patient presents with right plantar fasciitis/heel pain.  Patient states is about 70 to 80% improved.  He states that he still has some residual pain that he has when he is ambulating.  He has been wearing his brace on and off.  I encouraged him to continue wearing the brace.  He denies any other acute complaints.  He would like to discuss second steroid injection.  Review of Systems: Negative except as noted in the HPI. Denies N/V/F/Ch.  Past Medical History:  Diagnosis Date  . Allergy   . Arthritis   . BPH (benign prostatic hyperplasia)   . Hyperlipidemia     Current Outpatient Medications:  .  acetaminophen (TYLENOL) 500 MG tablet, Take 500 mg by mouth every 6 (six) hours as needed., Disp: , Rfl:  .  ALPRAZolam (XANAX) 0.25 MG tablet, Take 1 tablet (0.25 mg total) by mouth 2 (two) times daily as needed for anxiety., Disp: 20 tablet, Rfl: 0 .  amoxicillin-clavulanate (AUGMENTIN) 875-125 MG tablet, Take 1 tablet by mouth 2 (two) times daily., Disp: 14 tablet, Rfl: 0 .  finasteride (PROSCAR) 5 MG tablet, Take 1 tablet (5 mg total) by mouth daily., Disp: 90 tablet, Rfl: 3 .  fluticasone (FLONASE) 50 MCG/ACT nasal spray, Place 2 sprays into both nostrils daily., Disp: 16 g, Rfl: 6 .  meloxicam (MOBIC) 15 MG tablet, Take 15 mg by mouth daily., Disp: , Rfl:  .  rosuvastatin (CRESTOR) 10 MG tablet, Take 1 tablet (10 mg total) by mouth daily., Disp: 90 tablet, Rfl: 3  Social History   Tobacco Use  Smoking Status Never Smoker  Smokeless Tobacco Never Used    No Known Allergies Objective:  There were no vitals filed for this visit. There is no height or weight on file to calculate BMI. Constitutional Well developed. Well  nourished.  Vascular Dorsalis pedis pulses palpable bilaterally. Posterior tibial pulses palpable bilaterally. Capillary refill normal to all digits.  No cyanosis or clubbing noted. Pedal hair growth normal.  Neurologic Normal speech. Oriented to person, place, and time. Epicritic sensation to light touch grossly present bilaterally.  Dermatologic Nails well groomed and normal in appearance. No open wounds. No skin lesions.  Orthopedic: Normal joint ROM without pain or crepitus bilaterally. No visible deformities. Residual tender to palpation at the calcaneal tuber right. No pain with calcaneal squeeze right. Ankle ROM full range of motion right. Silf[place]old Test: negative right.   Radiographs: Taken and reviewed. No acute fractures or dislocations. No evidence of stress fracture.  Plantar heel spur present. Posterior heel spur absent.   Assessment:   1. Plantar fasciitis of right foot   2. Heel spur, right    Plan:  Patient was evaluated and treated and all questions answered.  Plantar Fasciitis, right - XR reviewed as above.  - Educated on icing and stretching. Instructions given.  -Second injection delivered to the plantar fascia as below. - DME: Plantar Fascial Brace - Pharmacologic management: None  Procedure: Injection Tendon/Ligament Location: Right plantar fascia at the glabrous junction; medial approach. Skin Prep: alcohol Injectate: 0.5 cc 0.5% marcaine plain, 0.5 cc of 1% Lidocaine, 0.5 cc kenalog 10. Disposition: Patient tolerated procedure well. Injection site dressed with  a band-aid.  No follow-ups on file.

## 2020-11-12 DIAGNOSIS — D225 Melanocytic nevi of trunk: Secondary | ICD-10-CM | POA: Diagnosis not present

## 2020-11-12 DIAGNOSIS — D2261 Melanocytic nevi of right upper limb, including shoulder: Secondary | ICD-10-CM | POA: Diagnosis not present

## 2020-11-12 DIAGNOSIS — Z85828 Personal history of other malignant neoplasm of skin: Secondary | ICD-10-CM | POA: Diagnosis not present

## 2020-11-12 DIAGNOSIS — L821 Other seborrheic keratosis: Secondary | ICD-10-CM | POA: Diagnosis not present

## 2020-11-12 DIAGNOSIS — D2262 Melanocytic nevi of left upper limb, including shoulder: Secondary | ICD-10-CM | POA: Diagnosis not present

## 2020-11-20 DIAGNOSIS — H35411 Lattice degeneration of retina, right eye: Secondary | ICD-10-CM | POA: Diagnosis not present

## 2020-11-20 DIAGNOSIS — H35341 Macular cyst, hole, or pseudohole, right eye: Secondary | ICD-10-CM | POA: Diagnosis not present

## 2020-12-11 ENCOUNTER — Encounter: Payer: Medicare Other | Admitting: Podiatry

## 2020-12-12 ENCOUNTER — Other Ambulatory Visit: Payer: Self-pay

## 2020-12-12 DIAGNOSIS — Z87898 Personal history of other specified conditions: Secondary | ICD-10-CM

## 2020-12-14 ENCOUNTER — Other Ambulatory Visit: Payer: Self-pay

## 2020-12-14 ENCOUNTER — Other Ambulatory Visit: Payer: Medicare Other

## 2020-12-14 DIAGNOSIS — R972 Elevated prostate specific antigen [PSA]: Secondary | ICD-10-CM | POA: Diagnosis not present

## 2020-12-14 DIAGNOSIS — Z87898 Personal history of other specified conditions: Secondary | ICD-10-CM

## 2020-12-15 LAB — PSA: Prostate Specific Ag, Serum: 1.2 ng/mL (ref 0.0–4.0)

## 2020-12-18 NOTE — Progress Notes (Signed)
12/19/2020  8:51 AM   Johnny Munoz [birth year] [id number]  Referring provider: [name], MD [place],  [place] [address]  Chief Complaint  Patient presents with  . Benign Prostatic Hypertrophy   Urological history 1. Elevated PSA - Underwent a prostate biopsy in 10/2011 following a PSA of 4.1 that was benign. He was followed by Dr. [name] at [facility] at that time - PSA Trend Component     Latest Ref Rng & Units 08/22/2016 12/10/2017 12/13/2018 12/19/2019  Prostate Specific Ag, Serum     0.0 - 4.0 ng/mL 1.0 0.9 0.8 1.3   Component     Latest Ref Rng & Units 12/14/2020  Prostate Specific Ag, Serum     0.0 - 4.0 ng/mL 1.2   Corrected value 2.4  2. BPH with LU TS - I PSS: 6/1 - underwent TUMT with Dr. [name] in 2007 - on finasteride 5 mg daily  HPI: Johnny Munoz is a 70 y.o.  male who presents today for a yearly follow up.   He has urinary frequency when he consumes a lot of caffeine.  Patient denies any modifying or aggravating factors.  Patient denies any gross hematuria, dysuria or suprapubic/flank pain.  Patient denies any fevers, chills, nausea or vomiting.   His UA is clear.  His PVR is 0 mL.     IPSS    Row Name 12/19/20 0800         International Prostate Symptom Score   How often have you had the sensation of not emptying your bladder? Less than 1 in 5     How often have you had to urinate less than every two hours? Less than 1 in 5 times     How often have you found you stopped and started again several times when you urinated? Less than 1 in 5 times     How often have you found it difficult to postpone urination? Less than 1 in 5 times     How often have you had a weak urinary stream? Less than 1 in 5 times     How often have you had to strain to start urination? Not at All     How many times did you typically get up at night to urinate? 1 Time     Total IPSS Score 6           Quality of Life due to  urinary symptoms   If you were to spend the rest of your life with your urinary condition just the way it is now how would you feel about that? Pleased            Score:  1-7 Mild 8-19 Moderate 20-35 Severe   PMH: Past Medical History:  Diagnosis Date  . Allergy   . Arthritis   . BPH (benign prostatic hyperplasia)   . Hyperlipidemia     Surgical History: Past Surgical History:  Procedure Laterality Date  . COLONOSCOPY  2003, 10/12/13   Dr. [name], Dr [name]  . Trans Urethral Microwave Therapy      Home Medications:  Allergies as of 12/19/2020   No Known Allergies     Medication List       Accurate as of December 19, 2020  8:51 AM. If you have any questions, ask your nurse or doctor.        STOP taking these medications   ALPRAZolam 0.25 MG tablet Commonly known as: Duanne Moron Stopped  by: Elzabeth Mcquerry, PA-C   amoxicillin-clavulanate 875-125 MG tablet Commonly known as: AUGMENTIN Stopped by: Ladonne Sharples, PA-C     TAKE these medications   acetaminophen 500 MG tablet Commonly known as: TYLENOL Take 500 mg by mouth every 6 (six) hours as needed.   finasteride 5 MG tablet Commonly known as: PROSCAR Take 1 tablet (5 mg total) by mouth daily.   fluticasone 50 MCG/ACT nasal spray Commonly known as: FLONASE Place 2 sprays into both nostrils daily.   meloxicam 15 MG tablet Commonly known as: MOBIC Take 15 mg by mouth daily.   rosuvastatin 10 MG tablet Commonly known as: CRESTOR Take 1 tablet (10 mg total) by mouth daily.      Allergies: No Known Allergies  Family History: Family History  Problem Relation Age of Onset  . Diabetes Father   . Heart disease Father        CABG after 42  . Prostate cancer Maternal Uncle   . Kidney cancer Neg Hx   . Bladder Cancer Neg Hx     Social History:  reports that he has never smoked. He has never used smokeless tobacco. He reports current alcohol use of about 3.0 standard drinks of alcohol per week. He  reports that he does not use drugs.  Pertinent ROS in HPI  Physical Exam: BP 134/87   Pulse 71   Ht 5\' 9"  (1.753 m)   Wt 143 lb (64.9 kg)   BMI 21.12 kg/m   Constitutional:  Well nourished. Alert and oriented, No acute distress. HEENT: [place] AT, mask in place.  Trachea midline Cardiovascular: No clubbing, cyanosis, or edema. Respiratory: Normal respiratory effort, no increased work of breathing. GU: No CVA tenderness.  No bladder fullness or masses.  Patient with circumcised phallus.  Urethral meatus is patent.  No penile discharge. No penile lesions or rashes. Scrotum without lesions, cysts, rashes and/or edema.  Testicles are located scrotally bilaterally. No masses are appreciated in the testicles. Left and right epididymis are normal. Rectal: Patient with  normal sphincter tone. Anus and perineum without scarring or rashes. No rectal masses are appreciated. Prostate is approximately 50 grams, no nodules are appreciated. Seminal vesicles could not be palpated Neurologic: Grossly intact, no focal deficits, moving all 4 extremities. Psychiatric: Normal mood and affect.   Laboratory Data: Urinalysis Component     Latest Ref Rng & Units 12/19/2020  Specific Gravity, UA     1.005 - 1.030 >1.030 (H)  pH, UA     5.0 - 7.5 5.0  Color, UA     Yellow Yellow  Appearance Ur     Clear Clear  Leukocytes,UA     Negative Negative  Protein,UA     Negative/Trace Negative  Glucose, UA     Negative Negative  Ketones, UA     Negative Negative  RBC, UA     Negative Negative  Bilirubin, UA     Negative Negative  Urobilinogen, Ur     0.2 - 1.0 mg/dL 0.2  Nitrite, UA     Negative Negative  Microscopic Examination      See below:   Component     Latest Ref Rng & Units 12/19/2020  WBC, UA     0 - 5 /hpf 0-5  RBC     0 - 2 /hpf 0-2  Epithelial Cells (non renal)     0 - 10 /hpf None seen  Mucus, UA     Not Estab. Present (A)  Bacteria, UA  None seen/Few None seen   Lab Results   Component Value Date   CREATININE 0.92 10/16/2020    Lab Results  Component Value Date   HGBA1C 5.9 10/16/2020   Component     Latest Ref Rng & Units 10/16/2020  TSH     0.35 - 4.50 uIU/mL 1.32   Component     Latest Ref Rng & Units 10/16/2020  Sodium     135 - 145 mEq/L 139  Potassium     3.5 - 5.1 mEq/L 4.7  Chloride     96 - 112 mEq/L 101  CO2     19 - 32 mEq/L 30  Glucose     70 - 99 mg/dL 104 (H)  BUN     6 - 23 mg/dL 17  Creatinine     0.40 - 1.50 mg/dL 0.92  Total Bilirubin     0.2 - 1.2 mg/dL 0.5  Alkaline Phosphatase     39 - 117 U/L 51  AST     0 - 37 U/L 15  ALT     0 - 53 U/L 13  Total Protein     6.0 - 8.3 g/dL 6.6  Albumin     3.5 - 5.2 g/dL 4.5  Calcium     8.4 - 10.5 mg/dL 9.4  GFR     >60.00 mL/min 85.14   Component     Latest Ref Rng & Units 10/16/2020  Cholesterol     0 - 200 mg/dL 162  Triglycerides     0.0 - 149.0 mg/dL 69.0  HDL Cholesterol     >39.00 mg/dL 73.70  VLDL     0.0 - 40.0 mg/dL 13.8  LDL (calc)     0 - 99 mg/dL 75  Total CHOL/HDL Ratio      2  NonHDL      88.33   Pertinent Imaging Results for [place], Jerrold A (MRN [id number]) as of 12/19/2020 08:35  Ref. Range 12/19/2020 08:28  Scan Result Unknown 63mL     Assessment & Plan:    1. BPH with LUTS IPSS score is 6/1, it is stable Continue conservative management, avoiding bladder irritants and timed voiding's Most bothersome symptoms is/are frequency Continue finasteride 5 mg daily-refills given PSA has stabilized  RTC in 12 months for IPSS, PSA and exam   Return in about 1 year (around 12/19/2021) for IPSS, PSA and exam.  Zara Council, [facility] [address], [place], [place] [address] [phone]

## 2020-12-19 ENCOUNTER — Other Ambulatory Visit: Payer: Self-pay

## 2020-12-19 ENCOUNTER — Encounter: Payer: Self-pay | Admitting: Urology

## 2020-12-19 ENCOUNTER — Ambulatory Visit (INDEPENDENT_AMBULATORY_CARE_PROVIDER_SITE_OTHER): Payer: Medicare Other | Admitting: Urology

## 2020-12-19 VITALS — BP 134/87 | HR 71 | Ht 69.0 in | Wt 143.0 lb

## 2020-12-19 DIAGNOSIS — N138 Other obstructive and reflux uropathy: Secondary | ICD-10-CM

## 2020-12-19 DIAGNOSIS — N401 Enlarged prostate with lower urinary tract symptoms: Secondary | ICD-10-CM

## 2020-12-19 LAB — URINALYSIS, COMPLETE
Bilirubin, UA: NEGATIVE
Glucose, UA: NEGATIVE
Ketones, UA: NEGATIVE
Leukocytes,UA: NEGATIVE
Nitrite, UA: NEGATIVE
Protein,UA: NEGATIVE
RBC, UA: NEGATIVE
Specific Gravity, UA: >1.03 — ABNORMAL HIGH (ref 1.005–1.030)
Urobilinogen, Ur: 0.2 mg/dL (ref 0.2–1.0)
pH, UA: 5 (ref 5.0–7.5)

## 2020-12-19 LAB — MICROSCOPIC EXAMINATION
Bacteria, UA: NONE SEEN
Epithelial Cells (non renal): NONE SEEN /hpf (ref 0–10)

## 2020-12-19 LAB — BLADDER SCAN AMB NON-IMAGING

## 2020-12-19 MED ORDER — FINASTERIDE 5 MG PO TABS
5.0000 mg | ORAL_TABLET | Freq: Every day | ORAL | 3 refills | Status: DC
Start: 2020-12-19 — End: 2021-12-19

## 2020-12-21 DIAGNOSIS — Z20822 Contact with and (suspected) exposure to covid-19: Secondary | ICD-10-CM | POA: Diagnosis not present

## 2020-12-21 DIAGNOSIS — Z03818 Encounter for observation for suspected exposure to other biological agents ruled out: Secondary | ICD-10-CM | POA: Diagnosis not present

## 2021-01-21 DIAGNOSIS — H6123 Impacted cerumen, bilateral: Secondary | ICD-10-CM | POA: Diagnosis not present

## 2021-01-21 DIAGNOSIS — J309 Allergic rhinitis, unspecified: Secondary | ICD-10-CM | POA: Diagnosis not present

## 2021-01-21 DIAGNOSIS — M722 Plantar fascial fibromatosis: Secondary | ICD-10-CM | POA: Diagnosis not present

## 2021-03-19 DIAGNOSIS — H524 Presbyopia: Secondary | ICD-10-CM | POA: Diagnosis not present

## 2021-03-19 DIAGNOSIS — H35341 Macular cyst, hole, or pseudohole, right eye: Secondary | ICD-10-CM | POA: Diagnosis not present

## 2021-03-19 DIAGNOSIS — H43811 Vitreous degeneration, right eye: Secondary | ICD-10-CM | POA: Diagnosis not present

## 2021-03-19 DIAGNOSIS — H52223 Regular astigmatism, bilateral: Secondary | ICD-10-CM | POA: Diagnosis not present

## 2021-03-19 DIAGNOSIS — H5213 Myopia, bilateral: Secondary | ICD-10-CM | POA: Diagnosis not present

## 2021-03-20 ENCOUNTER — Other Ambulatory Visit: Payer: Self-pay | Admitting: Internal Medicine

## 2021-03-20 DIAGNOSIS — E785 Hyperlipidemia, unspecified: Secondary | ICD-10-CM

## 2021-04-09 ENCOUNTER — Other Ambulatory Visit (INDEPENDENT_AMBULATORY_CARE_PROVIDER_SITE_OTHER): Payer: Medicare Other

## 2021-04-09 ENCOUNTER — Other Ambulatory Visit: Payer: Self-pay

## 2021-04-09 ENCOUNTER — Telehealth: Payer: Self-pay | Admitting: Internal Medicine

## 2021-04-09 DIAGNOSIS — E785 Hyperlipidemia, unspecified: Secondary | ICD-10-CM | POA: Diagnosis not present

## 2021-04-09 NOTE — Telephone Encounter (Signed)
PT is calling in stating that back in November Dr.Tullo wanted him to have labs done as well as getting the Pneumonia Shot. PT wanted to make sure that it was still fine to get the shot. He has a appointment today for labs.

## 2021-04-10 LAB — COMPREHENSIVE METABOLIC PANEL
ALT: 21 U/L (ref 0–53)
AST: 20 U/L (ref 0–37)
Albumin: 4.4 g/dL (ref 3.5–5.2)
Alkaline Phosphatase: 55 U/L (ref 39–117)
BUN: 17 mg/dL (ref 6–23)
CO2: 29 mEq/L (ref 19–32)
Calcium: 9.1 mg/dL (ref 8.4–10.5)
Chloride: 102 mEq/L (ref 96–112)
Creatinine, Ser: 0.9 mg/dL (ref 0.40–1.50)
GFR: 87.13 mL/min (ref >60.00)
Glucose, Bld: 113 mg/dL — ABNORMAL HIGH (ref 70–99)
Potassium: 4.1 mEq/L (ref 3.5–5.1)
Sodium: 139 mEq/L (ref 135–145)
Total Bilirubin: 0.7 mg/dL (ref 0.2–1.2)
Total Protein: 6.5 g/dL (ref 6.0–8.3)

## 2021-04-10 NOTE — Telephone Encounter (Signed)
Yes  prevnar 20

## 2021-04-10 NOTE — Telephone Encounter (Signed)
Pt is wanting to make sure it is okay for him to still get the pneumonia vaccine. If so which one would you like for him to get?

## 2021-04-10 NOTE — Telephone Encounter (Signed)
Spoke with pt and scheduled him an appt for the prevnar 20 vaccine

## 2021-04-11 ENCOUNTER — Ambulatory Visit (INDEPENDENT_AMBULATORY_CARE_PROVIDER_SITE_OTHER): Payer: Medicare Other

## 2021-04-11 ENCOUNTER — Other Ambulatory Visit: Payer: Self-pay

## 2021-04-11 DIAGNOSIS — Z23 Encounter for immunization: Secondary | ICD-10-CM | POA: Diagnosis not present

## 2021-04-11 NOTE — Progress Notes (Signed)
Patient came in today for Prevnar 20 vaccine given in left deltoid IM. Patient tolerated well no signs of distress.

## 2021-04-15 ENCOUNTER — Other Ambulatory Visit: Payer: Self-pay

## 2021-04-15 ENCOUNTER — Encounter: Payer: Self-pay | Admitting: Family

## 2021-04-15 ENCOUNTER — Ambulatory Visit (INDEPENDENT_AMBULATORY_CARE_PROVIDER_SITE_OTHER): Payer: Medicare Other | Admitting: Family

## 2021-04-15 ENCOUNTER — Other Ambulatory Visit: Payer: Medicare Other

## 2021-04-15 VITALS — BP 112/64 | HR 67 | Temp 98.1°F | Ht 69.0 in | Wt 140.6 lb

## 2021-04-15 DIAGNOSIS — M545 Low back pain, unspecified: Secondary | ICD-10-CM | POA: Diagnosis not present

## 2021-04-15 MED ORDER — MELOXICAM 15 MG PO TABS: 15.0000 mg | ORAL_TABLET | Freq: Every day | ORAL | 0 refills | Status: DC | PRN

## 2021-04-15 MED ORDER — CYCLOBENZAPRINE HCL 10 MG PO TABS: 5.0000 mg | ORAL_TABLET | Freq: Every evening | ORAL | 0 refills | Status: DC | PRN

## 2021-04-15 NOTE — Patient Instructions (Signed)
Heat heat heat Salon pas pain patch, biofreeze  Start flexeril ( muscle relexant) and mobic ( anti inflammatory) as needed.   Take mobic with food.   Do not drive or operate heavy machinery while on muscle relaxant. Please do not drink alcohol. Only take this medication as needed for acute muscle spasm at bedtime. This medication make you feel drowsy so be very careful.  Stop taking if become too drowsy or somnolent as this puts you at risk for falls. Please contact our office with any questions.   Let me know if pain is not improving

## 2021-04-15 NOTE — Progress Notes (Signed)
Subjective:    Patient ID: Johnny Munoz, male    DOB: 10-03-1951, 70 y.o.   MRN: [id number]  CC: Johnny Munoz is a 70 y.o. male who presents today for an acute visit.    HPI: Low back pain which started yesterday , started immediately after bent over while working in his yard.  He had been working in yard, had been sliding heavy plants and bag potting soil. Recent move and reports he has been lifting, carrying more than normal.   Pain is sharp and middle to right side of back.  Comfortable while sitting. Pain with bending  No pain with walking, numbness, rash, trouble urinating, flank pain, hematuria, dysuria.    Aleve, mobic 15mg   minimally helped  No h/o low back pain. No h/o renal stones.   H/o BCC on scalp.     HISTORY:  Past Medical History:  Diagnosis Date  . Allergy   . Arthritis   . BPH (benign prostatic hyperplasia)   . Hyperlipidemia    Past Surgical History:  Procedure Laterality Date  . COLONOSCOPY  2003, 10/12/13   Dr. [name], Dr [name]  . Trans Urethral Microwave Therapy     Family History  Problem Relation Age of Onset  . Diabetes Father   . Heart disease Father        CABG after 74  . Prostate cancer Maternal Uncle   . Kidney cancer Neg Hx   . Bladder Cancer Neg Hx     Allergies: Patient has no known allergies. Current Outpatient Medications on File Prior to Visit  Medication Sig Dispense Refill  . acetaminophen (TYLENOL) 500 MG tablet Take 500 mg by mouth every 6 (six) hours as needed.    . finasteride (PROSCAR) 5 MG tablet Take 1 tablet (5 mg total) by mouth daily. 90 tablet 3  . fluticasone (FLONASE) 50 MCG/ACT nasal spray Place 2 sprays into both nostrils daily. 16 g 6  . rosuvastatin (CRESTOR) 10 MG tablet Take 1 tablet (10 mg total) by mouth daily. 90 tablet 3   No current facility-administered medications on file prior to visit.    Social History   Tobacco Use  . Smoking status: Never Smoker  . Smokeless tobacco:  Never Used  Vaping Use  . Vaping Use: Never used  Substance Use Topics  . Alcohol use: Yes    Alcohol/week: 3.0 standard drinks    Types: 3 drink(s) per week  . Drug use: No    Review of Systems  Constitutional: Negative for chills and fever.  Respiratory: Negative for cough.   Cardiovascular: Negative for chest pain and palpitations.  Gastrointestinal: Negative for abdominal pain, nausea and vomiting.  Genitourinary: Negative for decreased urine volume and flank pain.  Musculoskeletal: Positive for back pain.  Neurological: Negative for numbness.      Objective:    BP 112/64 (BP Location: Left Arm, Patient Position: Sitting, Cuff Size: Large)   Pulse 67   Temp 98.1 F (36.7 C) (Oral)   Ht 5\' 9"  (1.753 m)   Wt 140 lb 9.6 oz (63.8 kg)   SpO2 97%   BMI 20.76 kg/m    Physical Exam Vitals reviewed.  Constitutional:      Appearance: He is well-developed.  Cardiovascular:     Rate and Rhythm: Regular rhythm.     Heart sounds: Normal heart sounds.  Pulmonary:     Effort: Pulmonary effort is normal. No respiratory distress.     Breath sounds: Normal breath  sounds. No wheezing, rhonchi or rales.  Musculoskeletal:     Lumbar back: No swelling, spasms or tenderness. Normal range of motion.       Back:     Comments: Full range of motion with flexion, extension, lateral side bends however pain with forward bend when he outstretched to touch his toes.No pain, numbness, tingling elicited with single leg raise bilaterally. No rash.  Lymphadenopathy:     Head:     Left side of head: No submandibular or preauricular adenopathy.  Skin:    General: Skin is warm and dry.  Neurological:     Mental Status: He is alert.  Psychiatric:        Speech: Speech normal.        Behavior: Behavior normal.        Assessment & Plan:   Problem List Items Addressed This Visit      Other   Low back pain - Primary    Acute. No alarm features today.  Onset and presentation consistent with  muscle spasm. We discussed however likely a degree of DDD. Agreed that xray today wouldn't change conservative management with heat, prn mobic, and flexeril . He will let me know if no improvement in the next week or two so we can pursue imaging, PT, and /or orthopedic consult as we discussed today.      Relevant Medications   meloxicam (MOBIC) 15 MG tablet   cyclobenzaprine (FLEXERIL) 10 MG tablet        I have changed Dellis Filbert A. Oldaker's meloxicam. I am also having him start on cyclobenzaprine. Additionally, I am having him maintain his acetaminophen, fluticasone, rosuvastatin, and finasteride.   Meds ordered this encounter  Medications  . meloxicam (MOBIC) 15 MG tablet    Sig: Take 1 tablet (15 mg total) by mouth daily as needed for pain. Take with food.    Dispense:  30 tablet    Refill:  0    Order Specific Question:   Supervising Provider    Answer:   Deborra Medina L [[id number]]  . cyclobenzaprine (FLEXERIL) 10 MG tablet    Sig: Take 0.5 tablets (5 mg total) by mouth at bedtime as needed for muscle spasms.    Dispense:  30 tablet    Refill:  0    Order Specific Question:   Supervising Provider    Answer:   [name] [[id number]]    Return precautions given.   Risks, benefits, and alternatives of the medications and treatment plan prescribed today were discussed, and patient expressed understanding.   Education regarding symptom management and diagnosis given to patient on AVS.  Continue to follow with [name], MD for routine health maintenance.   Johnny Munoz and I agreed with plan.   Mable Paris, FNP

## 2021-04-15 NOTE — Assessment & Plan Note (Addendum)
Acute. No alarm features today.  Onset and presentation consistent with muscle spasm. We discussed however likely a degree of DDD. Agreed that xray today wouldn't change conservative management with heat, prn mobic, and flexeril . He will let me know if no improvement in the next week or two so we can pursue imaging, PT, and /or orthopedic consult as we discussed today.

## 2021-05-27 DIAGNOSIS — L538 Other specified erythematous conditions: Secondary | ICD-10-CM | POA: Diagnosis not present

## 2021-05-27 DIAGNOSIS — L820 Inflamed seborrheic keratosis: Secondary | ICD-10-CM | POA: Diagnosis not present

## 2021-07-22 DIAGNOSIS — H6123 Impacted cerumen, bilateral: Secondary | ICD-10-CM | POA: Diagnosis not present

## 2021-07-22 DIAGNOSIS — J309 Allergic rhinitis, unspecified: Secondary | ICD-10-CM | POA: Diagnosis not present

## 2021-07-22 DIAGNOSIS — M722 Plantar fascial fibromatosis: Secondary | ICD-10-CM | POA: Diagnosis not present

## 2021-08-09 DIAGNOSIS — D1723 Benign lipomatous neoplasm of skin and subcutaneous tissue of right leg: Secondary | ICD-10-CM | POA: Diagnosis not present

## 2021-08-09 DIAGNOSIS — L237 Allergic contact dermatitis due to plants, except food: Secondary | ICD-10-CM | POA: Diagnosis not present

## 2021-08-09 DIAGNOSIS — L918 Other hypertrophic disorders of the skin: Secondary | ICD-10-CM | POA: Diagnosis not present

## 2021-09-02 ENCOUNTER — Other Ambulatory Visit: Payer: Self-pay

## 2021-09-02 ENCOUNTER — Ambulatory Visit
Admission: RE | Admit: 2021-09-02 | Discharge: 2021-09-02 | Disposition: A | Discharge status: Home / Self Care | Payer: Medicare Other | Source: Ambulatory Visit | Attending: Emergency Medicine | Admitting: Emergency Medicine

## 2021-09-02 VITALS — BP 127/84 | HR 106 | Temp 99.3°F | Resp 18

## 2021-09-02 DIAGNOSIS — J0100 Acute maxillary sinusitis, unspecified: Secondary | ICD-10-CM

## 2021-09-02 MED ORDER — AZITHROMYCIN 250 MG PO TABS: 250.0000 mg | ORAL_TABLET | Freq: Every day | ORAL | 0 refills | Status: DC

## 2021-09-02 NOTE — ED Provider Notes (Addendum)
[facility]    CSN: [id number] Arrival date & time: 09/02/21  0930      History   Chief Complaint Chief Complaint  Patient presents with   Appointment    9:45   Cough    HPI Johnny Munoz is a 70 y.o. male.  Patient presents with 10-day history of sinus congestion, postnasal drip, runny nose, nonproductive cough.  He denies fever, rash, sore throat, shortness of breath, or other symptoms.  Several OTC treatments attempted.  His medical history includes prediabetes, BPH, arthritis, hyperlipidemia, seasonal allergies.  The history is provided by the patient and medical records.   Past Medical History:  Diagnosis Date   Allergy    Arthritis    BPH (benign prostatic hyperplasia)    Hyperlipidemia     Patient Active Problem List   Diagnosis Date Noted   Low back pain 04/15/2021   Colon cancer screening 10/16/2020   Grief 10/16/2020   Prediabetes 10/11/2019   Educated about COVID-19 virus infection 04/26/2019   Bradycardia with 41-50 beats per minute 09/19/2018   History of syncope 09/19/2018   Benign essential tremor 08/21/2016   History of elevated PSA 12/15/2015   Welcome to Medicare preventive visit 06/28/2013   Osteoarthritis 06/14/2012   Hyperlipidemia, unspecified 06/14/2012   Enlarged prostate with lower urinary tract symptoms (LUTS) 06/14/2012    Past Surgical History:  Procedure Laterality Date   COLONOSCOPY  2003, 10/12/13   Dr. [name], Dr Hermine Messick Urethral Microwave Therapy         Home Medications    Prior to Admission medications   Medication Sig Start Date End Date Taking? Authorizing Provider  azithromycin (ZITHROMAX) 250 MG tablet Take 1 tablet (250 mg total) by mouth daily. Take first 2 tablets together, then 1 every day until finished. 09/02/21  Yes Sharion Balloon, NP  acetaminophen (TYLENOL) 500 MG tablet Take 500 mg by mouth every 6 (six) hours as needed.    [provider]  cyclobenzaprine (FLEXERIL) 10 MG  tablet Take 0.5 tablets (5 mg total) by mouth at bedtime as needed for muscle spasms. Patient not taking: Reported on 09/02/2021 04/15/21   Burnard Hawthorne, FNP  finasteride (PROSCAR) 5 MG tablet Take 1 tablet (5 mg total) by mouth daily. 12/19/20   McGowan, Larene Beach A, PA-C  fluticasone (FLONASE) 50 MCG/ACT nasal spray Place 2 sprays into both nostrils daily. 09/17/18   [name], MD  meloxicam (MOBIC) 15 MG tablet Take 1 tablet (15 mg total) by mouth daily as needed for pain. Take with food. 04/15/21   Burnard Hawthorne, FNP  rosuvastatin (CRESTOR) 10 MG tablet Take 1 tablet (10 mg total) by mouth daily. 10/16/20   [name], MD    Family History Family History  Problem Relation Age of Onset   Diabetes Father    Heart disease Father        CABG after 22   Prostate cancer Maternal Uncle    Kidney cancer Neg Hx    Bladder Cancer Neg Hx     Social History Social History   Tobacco Use   Smoking status: Never   Smokeless tobacco: Never  Vaping Use   Vaping Use: Never used  Substance Use Topics   Alcohol use: Yes    Alcohol/week: 3.0 standard drinks    Types: 3 drink(s) per week   Drug use: No     Allergies   Patient has no known allergies.   Review  of Systems Review of Systems  Constitutional:  Negative for chills and fever.  HENT:  Positive for congestion, postnasal drip and rhinorrhea. Negative for ear pain and sore throat.   Respiratory:  Positive for cough. Negative for shortness of breath.   Cardiovascular:  Negative for chest pain and palpitations.  Gastrointestinal:  Negative for abdominal pain, diarrhea and vomiting.  Skin:  Negative for color change and rash.  All other systems reviewed and are negative.   Physical Exam Triage Vital Signs ED Triage Vitals  Enc Vitals Group     BP 09/02/21 0952 127/84     Pulse Rate 09/02/21 0952 (!) 106     Resp 09/02/21 0952 18     Temp 09/02/21 0952 99.3 F (37.4 C)     Temp Source 09/02/21 0952 Oral      SpO2 09/02/21 0952 95 %     Weight --      Height --      Head Circumference --      Peak Flow --      Pain Score 09/02/21 1007 0     Pain Loc --      Pain Edu? --      Excl. in [place]? --    No data found.  Updated Vital Signs BP 127/84 (BP Location: Left Arm)   Pulse (!) 106   Temp 99.3 F (37.4 C) (Oral)   Resp 18   SpO2 95%   Visual Acuity Right Eye Distance:   Left Eye Distance:   Bilateral Distance:    Right Eye Near:   Left Eye Near:    Bilateral Near:     Physical Exam Vitals and nursing note reviewed.  Constitutional:      General: He is not in acute distress.    Appearance: He is well-developed.  HENT:     Head: Normocephalic and atraumatic.     Right Ear: Tympanic membrane normal.     Left Ear: Tympanic membrane normal.     Nose: Congestion and rhinorrhea present.     Mouth/Throat:     Mouth: Mucous membranes are moist.     Pharynx: Oropharynx is clear.  Eyes:     Conjunctiva/sclera: Conjunctivae normal.  Cardiovascular:     Rate and Rhythm: Normal rate and regular rhythm.     Heart sounds: No murmur heard. Pulmonary:     Effort: Pulmonary effort is normal. No respiratory distress.     Breath sounds: Normal breath sounds.  Abdominal:     Palpations: Abdomen is soft.     Tenderness: There is no abdominal tenderness.  Musculoskeletal:     Cervical back: Neck supple.  Skin:    General: Skin is warm and dry.  Neurological:     General: No focal deficit present.     Mental Status: He is alert and oriented to person, place, and time.     Gait: Gait normal.  Psychiatric:        Mood and Affect: Mood normal.        Behavior: Behavior normal.     UC Treatments / Results  Labs (all labs ordered are listed, but only abnormal results are displayed) Labs Reviewed - No data to display  EKG   Radiology No results found.  Procedures Procedures (including critical care time)  Medications Ordered in UC Medications - No data to display  Initial  Impression / Assessment and Plan / UC Course  I have reviewed the triage vital signs and the  nursing notes.  Pertinent labs & imaging results that were available during my care of the patient were reviewed by me and considered in my medical decision making (see chart for details).  Acute sinusitis.  Treating with Zithromax.  Patient declines COVID test today.  Instructed him to follow-up with his PCP if his symptoms are not proving.  He agrees to plan of care.   Final Clinical Impressions(s) / UC Diagnoses   Final diagnoses:  Acute non-recurrent maxillary sinusitis     Discharge Instructions      Take the Zithromax as directed.    Follow up with your primary care provider if your symptoms are not improving.         ED Prescriptions     Medication Sig Dispense Auth. Provider   azithromycin (ZITHROMAX) 250 MG tablet Take 1 tablet (250 mg total) by mouth daily. Take first 2 tablets together, then 1 every day until finished. 6 tablet Sharion Balloon, NP      PDMP not reviewed this encounter.   Sharion Balloon, NP 09/02/21 1027    Sharion Balloon, NP 09/02/21 1031

## 2021-09-02 NOTE — Discharge Instructions (Addendum)
Take the Zithromax as directed.  Follow up with your primary care provider if your symptoms are not improving.    

## 2021-09-02 NOTE — ED Triage Notes (Signed)
Patient started feeling bad one week ago.  Patient reports he has this every year.  Has taken robitussin dm and mucinex.  Patient is not able to sleep due to congestion, stuffiness in head

## 2021-09-18 DIAGNOSIS — Z135 Encounter for screening for eye and ear disorders: Secondary | ICD-10-CM | POA: Diagnosis not present

## 2021-09-18 DIAGNOSIS — H5213 Myopia, bilateral: Secondary | ICD-10-CM | POA: Diagnosis not present

## 2021-09-18 DIAGNOSIS — H35341 Macular cyst, hole, or pseudohole, right eye: Secondary | ICD-10-CM | POA: Diagnosis not present

## 2021-09-18 DIAGNOSIS — H43811 Vitreous degeneration, right eye: Secondary | ICD-10-CM | POA: Diagnosis not present

## 2021-09-18 DIAGNOSIS — H524 Presbyopia: Secondary | ICD-10-CM | POA: Diagnosis not present

## 2021-09-18 DIAGNOSIS — H52223 Regular astigmatism, bilateral: Secondary | ICD-10-CM | POA: Diagnosis not present

## 2021-09-23 ENCOUNTER — Ambulatory Visit: Payer: Medicare Other

## 2021-09-30 ENCOUNTER — Ambulatory Visit (INDEPENDENT_AMBULATORY_CARE_PROVIDER_SITE_OTHER): Payer: Medicare Other

## 2021-09-30 ENCOUNTER — Other Ambulatory Visit: Payer: Self-pay

## 2021-09-30 DIAGNOSIS — Z23 Encounter for immunization: Secondary | ICD-10-CM | POA: Diagnosis not present

## 2021-10-02 ENCOUNTER — Ambulatory Visit (INDEPENDENT_AMBULATORY_CARE_PROVIDER_SITE_OTHER): Payer: Medicare Other

## 2021-10-02 VITALS — Ht 69.0 in | Wt 140.0 lb

## 2021-10-02 DIAGNOSIS — Z0000 Encounter for general adult medical examination without abnormal findings: Secondary | ICD-10-CM | POA: Diagnosis not present

## 2021-10-02 NOTE — Progress Notes (Addendum)
Subjective:   Johnny Munoz is a 70 y.o. male who presents for Medicare Annual/Subsequent preventive examination.  Review of Systems    No ROS.  Medicare Wellness Virtual Visit.  Visual/audio telehealth visit, UTA vital signs.   See social history for additional risk factors.   Cardiac Risk Factors include: advanced age (>33men, >49 women);male gender     Objective:    Today's Vitals   10/02/21 1118  Weight: 140 lb (63.5 kg)  Height: 5\' 9"  (1.753 m)   Body mass index is 20.67 kg/m.  Advanced Directives 10/02/2021 10/01/2020 09/22/2019 09/17/2018 07/31/2018  Does Patient Have a Medical Advance Directive? Yes Yes Yes Yes Yes  Type of Paramedic of [place];Living will [place];Living will [place];Living will [place];Living will [place]  Does patient want to make changes to medical advance directive? No - Patient declined No - Patient declined No - Patient declined No - Patient declined No - Patient declined  Copy of [place] in Chart? Yes - validated most recent copy scanned in chart (See row information) Yes - validated most recent copy scanned in chart (See row information) No - copy requested No - copy requested No - copy requested  Would patient like information on creating a medical advance directive? - - - - No - Patient declined    Current Medications (verified) Outpatient Encounter Medications as of 10/02/2021  Medication Sig   acetaminophen (TYLENOL) 500 MG tablet Take 500 mg by mouth every 6 (six) hours as needed.   finasteride (PROSCAR) 5 MG tablet Take 1 tablet (5 mg total) by mouth daily.   fluticasone (FLONASE) 50 MCG/ACT nasal spray Place 2 sprays into both nostrils daily.   meloxicam (MOBIC) 15 MG tablet Take 1 tablet (15 mg total) by mouth daily as needed for pain. Take with food.   rosuvastatin (CRESTOR) 10 MG tablet Take 1 tablet  (10 mg total) by mouth daily.   [DISCONTINUED] azithromycin (ZITHROMAX) 250 MG tablet Take 1 tablet (250 mg total) by mouth daily. Take first 2 tablets together, then 1 every day until finished.   [DISCONTINUED] cyclobenzaprine (FLEXERIL) 10 MG tablet Take 0.5 tablets (5 mg total) by mouth at bedtime as needed for muscle spasms. (Patient not taking: Reported on 09/02/2021)   No facility-administered encounter medications on file as of 10/02/2021.    Allergies (verified) Patient has no known allergies.   History: Past Medical History:  Diagnosis Date   Allergy    Arthritis    BPH (benign prostatic hyperplasia)    Hyperlipidemia    Past Surgical History:  Procedure Laterality Date   COLONOSCOPY  2003, 10/12/13   Dr. [name], Dr Hermine Messick Urethral Microwave Therapy     Family History  Problem Relation Age of Onset   Diabetes Father    Heart disease Father        CABG after 72   Prostate cancer Maternal Uncle    Kidney cancer Neg Hx    Bladder Cancer Neg Hx    Social History   Socioeconomic History   Marital status: Married    Spouse name: Not on file   Number of children: Not on file   Years of education: 16   Highest education level: Not on file  Occupational History   Occupation: Customer Service    Employer: [employer]   Occupation: Retired  Tobacco Use   Smoking status: Never  Smokeless tobacco: Never  Vaping Use   Vaping Use: Never used  Substance and Sexual Activity   Alcohol use: Yes    Alcohol/week: 3.0 standard drinks    Types: 3 drink(s) per week   Drug use: No   Sexual activity: Not on file  Other Topics Concern   Not on file  Social History Narrative   Regular exercise-yes   Caffeine Use-yes         Social Determinants of Health   Financial Resource Strain: Low Risk    Difficulty of Paying Living Expenses: Not hard at all  Food Insecurity: No Food Insecurity   Worried About Charity fundraiser in the Last Year: Never true    Ran Out of Food in the Last Year: Never true  Transportation Needs: No Transportation Needs   Lack of Transportation (Medical): No   Lack of Transportation (Non-Medical): No  Physical Activity: Sufficiently Active   Days of Exercise per Week: 5 days   Minutes of Exercise per Session: 40 min  Stress: No Stress Concern Present   Feeling of Stress : Not at all  Social Connections: Socially Integrated   Frequency of Communication with Friends and Family: Not on file   Frequency of Social Gatherings with Friends and Family: More than three times a week   Attends Religious Services: More than 4 times per year   Active Member of [employer] or Organizations: Yes   Attends Music therapist: More than 4 times per year   Marital Status: Married    Tobacco Counseling Counseling given: Not Answered   Clinical Intake:  Pre-visit preparation completed: Yes        Diabetes: No  How often do you need to have someone help you when you read instructions, pamphlets, or other written materials from your doctor or pharmacy?: 1 - Never   Interpreter Needed?: No      Activities of Daily Living In your present state of health, do you have any difficulty performing the following activities: 10/02/2021  Hearing? N  Vision? N  Difficulty concentrating or making decisions? N  Walking or climbing stairs? N  Dressing or bathing? N  Doing errands, shopping? N  Preparing Food and eating ? N  Using the Toilet? N  In the past six months, have you accidently leaked urine? N  Do you have problems with loss of bowel control? N  Managing your Medications? N  Managing your Finances? N  Housekeeping or managing your Housekeeping? N  Some recent data might be hidden    Patient Care Team: [name], MD as PCP - General (Internal Medicine) [name], Forest Gleason, MD (General Surgery) [name], MD (Internal Medicine) Christene Lye, MD (General Surgery) [name], MD  (Internal Medicine)  Indicate any recent Medical Services you may have received from other than [facility] providers in the past year (date may be approximate).     Assessment:   This is a routine wellness examination for Johnny Munoz.  Virtual Visit via Telephone Note  I connected with  Johnny Munoz on 10/02/21 at 11:15 AM EDT by telephone and verified that I am speaking with the cor;rect person using two identifiers.  Location: Patient: home Provider: office Persons participating in the virtual visit: patient/Nurse Health Advisor   I discussed the limitations, risks, security and privacy concerns of performing an evaluation and management service by telephone and the availability of in person appointments. The patient expressed understanding and agreed to proceed.  Interactive audio and video telecommunications were attempted between this nurse and patient, however failed, due to patient having technical difficulties OR patient did not have access to video capability.  We continued and completed visit with audio only.  Some vital signs may be absent or patient reported.   OBrien-Blaney, Lua Feng L, LPN   Hearing/Vision screen Hearing Screening - Comments:: Patient is able to hear conversational tones without difficulty.  No issues reported. Vision Screening - Comments:: Followed by [facility]  Wears corrective lenses  Annual exams Scheduled for cataract removal.    Dietary issues and exercise activities discussed: Current Exercise Habits: Home exercise routine, Type of exercise: walking, Intensity: Mild   Goals Addressed             This Visit's Progress    Maintain Healthy Lifestyle       Monitor sugar intake       Depression Screen PHQ 2/9 Scores 10/02/2021 10/01/2020 10/11/2019 09/22/2019 09/17/2018 08/25/2017  PHQ - 2 Score 0 0 0 0 0 0  PHQ- 9 Score - - - - 1 1    Fall Risk Fall Risk  10/02/2021 10/16/2020 10/01/2020 10/11/2019 09/22/2019  Falls in the past  year? 0 0 0 0 0  Number falls in past yr: 0 - 0 - -  Follow up Falls evaluation completed Falls evaluation completed Falls evaluation completed Falls evaluation completed -    FALL RISK PREVENTION PERTAINING TO THE HOME: Adequate lighting in your home to reduce risk of falls? Yes   ASSISTIVE DEVICES UTILIZED TO PREVENT FALLS: Life alert? No  Use of a cane, walker or w/c? No   TIMED UP AND GO: Was the test performed? No .   Cognitive Function: Patient is alert and oriented x3.  Enjoys playing the piano and other brain health stimulating activities.  MMSE/6CIT deferred. Normal by direct communication/observation.  MMSE - Mini Mental State Exam 09/17/2018  Orientation to time 5  Orientation to Place 5  Registration 3  Attention/ Calculation 5  Recall 3  Language- name 2 objects 2  Language- repeat 1  Language- follow 3 step command 3  Language- read & follow direction 1  Write a sentence 1  Copy design 1  Total score 30     6CIT Screen 09/22/2019  What Year? 0 points  What month? 0 points  What time? 0 points  Count back from 20 0 points  Months in reverse 0 points  Repeat phrase 0 points  Total Score 0    Immunizations Immunization History  Administered Date(s) Administered   Fluad Quad(high Dose 65+) 08/27/2019, 09/30/2021   Influenza, High Dose Seasonal PF 08/25/2017, 09/07/2020   Influenza,inj,Quad PF,6+ Mos 08/11/2014, 09/14/2015, 09/17/2018   Influenza-Unspecified 08/31/2012   PFIZER(Purple Top)SARS-COV-2 Vaccination 01/13/2020, 02/07/2020, 08/31/2020   PNEUMOCOCCAL CONJUGATE-20 04/11/2021   Pneumococcal Conjugate-13 08/25/2017   Td 12/30/2007   Tdap 07/31/2018   Zoster, Live 10/10/2010   Covid vaccine- 3 completed. Deferred ongoing booster until follow up with pcp at annual appt.   Zostavax/Shingrix vaccine- discontinued per patient. No Hx of chicken pox. Notes antibodies previously drawn with no indicator of need for shingles vaccine at this time.  Verbalizes understanding this vaccine can be added on if needed in the future.   Screening Tests Health Maintenance  Topic Date Due   COVID-19 Vaccine (4 - Booster for [place] series) 10/18/2021 (Originally 10/26/2020)   COLONOSCOPY (Pts 45-67yrs Insurance coverage will need to be confirmed)  10/13/2023   TETANUS/TDAP  07/31/2028  Pneumonia Vaccine 26+ Years old  Completed   INFLUENZA VACCINE  Completed   Hepatitis C Screening  Completed   HPV VACCINES  Aged Out   Zoster Vaccines- Shingrix  Discontinued    Health Maintenance  There are no preventive care reminders to display for this patient.  Lung Cancer Screening: (Low Dose CT Chest recommended if Age 80-80 years, 30 pack-year currently smoking OR have quit w/in 15years.) does not qualify.   Vision Screening: Recommended annual ophthalmology exams for early detection of glaucoma and other disorders of the eye.  Dental Screening: Recommended annual dental exams for proper oral hygiene  Community Resource Referral / Chronic Care Management: CRR required this visit?  No   CCM required this visit?  No      Plan:   Keep all routine maintenance appointments.   I have personally reviewed and noted the following in the patient's chart:   Medical and social history Use of alcohol, tobacco or illicit drugs  Current medications and supplements including opioid prescriptions. Patient is not currently taking opioid prescriptions. Functional ability and status Nutritional status Physical activity Advanced directives List of other physicians Hospitalizations, surgeries, and ER visits in previous 12 months Vitals Screenings to include cognitive, depression, and falls Referrals and appointments  In addition, I have reviewed and discussed with patient certain preventive protocols, quality metrics, and best practice recommendations. A written personalized care plan for preventive services as well as general preventive health  recommendations were provided to patient.     OBrien-Blaney, Kem Hensen L, LPN   16/03/7902    I have reviewed the above information and agree with above.   Deborra Medina, MD

## 2021-10-02 NOTE — Patient Instructions (Addendum)
Johnny Munoz , Thank you for taking time to come for your Medicare Wellness Visit. I appreciate your ongoing commitment to your health goals. Please review the following plan we discussed and let me know if I can assist you in the future.   These are the goals we discussed:  Goals      Maintain Healthy Lifestyle     Monitor sugar intake        This is a list of the screening recommended for you and due dates:  Health Maintenance  Topic Date Due   COVID-19 Vaccine (4 - Booster for Pfizer series) 10/18/2021*   Colon Cancer Screening  10/13/2023   Tetanus Vaccine  07/31/2028   Pneumonia Vaccine  Completed   Flu Shot  Completed   Hepatitis C Screening: USPSTF Recommendation to screen - Ages 18-79 yo.  Completed   HPV Vaccine  Aged Out   Zoster (Shingles) Vaccine  Discontinued  *Topic was postponed. The date shown is not the original due date.    Advanced directives: on file  Conditions/risks identified: none new  Follow up in one year for your annual wellness visit.   Preventive Care 64 Years and Older, Male Preventive care refers to lifestyle choices and visits with your health care provider that can promote health and wellness. What does preventive care include? A yearly physical exam. This is also called an annual well check. Dental exams once or twice a year. Routine eye exams. Ask your health care provider how often you should have your eyes checked. Personal lifestyle choices, including: Daily care of your teeth and gums. Regular physical activity. Eating a healthy diet. Avoiding tobacco and drug use. Limiting alcohol use. Practicing safe sex. Taking low doses of aspirin every day. Taking vitamin and mineral supplements as recommended by your health care provider. What happens during an annual well check? The services and screenings done by your health care provider during your annual well check will depend on your age, overall health, lifestyle risk factors, and  family history of disease. Counseling  Your health care provider may ask you questions about your: Alcohol use. Tobacco use. Drug use. Emotional well-being. Home and relationship well-being. Sexual activity. Eating habits. History of falls. Memory and ability to understand (cognition). Work and work Statistician. Screening  You may have the following tests or measurements: Height, weight, and BMI. Blood pressure. Lipid and cholesterol levels. These may be checked every 5 years, or more frequently if you are over 66 years old. Skin check. Lung cancer screening. You may have this screening every year starting at age 73 if you have a 30-pack-year history of smoking and currently smoke or have quit within the past 15 years. Fecal occult blood test (FOBT) of the stool. You may have this test every year starting at age 57. Flexible sigmoidoscopy or colonoscopy. You may have a sigmoidoscopy every 5 years or a colonoscopy every 10 years starting at age 74. Prostate cancer screening. Recommendations will vary depending on your family history and other risks. Hepatitis C blood test. Hepatitis B blood test. Sexually transmitted disease (STD) testing. Diabetes screening. This is done by checking your blood sugar (glucose) after you have not eaten for a while (fasting). You may have this done every 1-3 years. Abdominal aortic aneurysm (AAA) screening. You may need this if you are a current or former smoker. Osteoporosis. You may be screened starting at age 70 if you are at high risk. Talk with your health care provider about your test  results, treatment options, and if necessary, the need for more tests. Vaccines  Your health care provider may recommend certain vaccines, such as: Influenza vaccine. This is recommended every year. Tetanus, diphtheria, and acellular pertussis (Tdap, Td) vaccine. You may need a Td booster every 10 years. Zoster vaccine. You may need this after age 51. Pneumococcal  13-valent conjugate (PCV13) vaccine. One dose is recommended after age 65. Pneumococcal polysaccharide (PPSV23) vaccine. One dose is recommended after age 24. Talk to your health care provider about which screenings and vaccines you need and how often you need them. This information is not intended to replace advice given to you by your health care provider. Make sure you discuss any questions you have with your health care provider. Document Released: 12/14/2015 Document Revised: 08/06/2016 Document Reviewed: 09/18/2015 Elsevier Interactive Patient Education  2017 [place] Prevention in the Home Falls can cause injuries. They can happen to people of all ages. There are many things you can do to make your home safe and to help prevent falls. What can I do on the outside of my home? Regularly fix the edges of walkways and driveways and fix any cracks. Remove anything that might make you trip as you walk through a door, such as a raised step or threshold. Trim any bushes or trees on the path to your home. Use bright outdoor lighting. Clear any walking paths of anything that might make someone trip, such as rocks or tools. Regularly check to see if handrails are loose or broken. Make sure that both sides of any steps have handrails. Any raised decks and porches should have guardrails on the edges. Have any leaves, snow, or ice cleared regularly. Use sand or salt on walking paths during winter. Clean up any spills in your garage right away. This includes oil or grease spills. What can I do in the bathroom? Use night lights. Install grab bars by the toilet and in the tub and shower. Do not use towel bars as grab bars. Use non-skid mats or decals in the tub or shower. If you need to sit down in the shower, use a plastic, non-slip stool. Keep the floor dry. Clean up any water that spills on the floor as soon as it happens. Remove soap buildup in the tub or shower regularly. Attach  bath mats securely with double-sided non-slip rug tape. Do not have throw rugs and other things on the floor that can make you trip. What can I do in the bedroom? Use night lights. Make sure that you have a light by your bed that is easy to reach. Do not use any sheets or blankets that are too big for your bed. They should not hang down onto the floor. Have a firm chair that has side arms. You can use this for support while you get dressed. Do not have throw rugs and other things on the floor that can make you trip. What can I do in the kitchen? Clean up any spills right away. Avoid walking on wet floors. Keep items that you use a lot in easy-to-reach places. If you need to reach something above you, use a strong step stool that has a grab bar. Keep electrical cords out of the way. Do not use floor polish or wax that makes floors slippery. If you must use wax, use non-skid floor wax. Do not have throw rugs and other things on the floor that can make you trip. What can I do with my stairs?  Do not leave any items on the stairs. Make sure that there are handrails on both sides of the stairs and use them. Fix handrails that are broken or loose. Make sure that handrails are as long as the stairways. Check any carpeting to make sure that it is firmly attached to the stairs. Fix any carpet that is loose or worn. Avoid having throw rugs at the top or bottom of the stairs. If you do have throw rugs, attach them to the floor with carpet tape. Make sure that you have a light switch at the top of the stairs and the bottom of the stairs. If you do not have them, ask someone to add them for you. What else can I do to help prevent falls? Wear shoes that: Do not have high heels. Have rubber bottoms. Are comfortable and fit you well. Are closed at the toe. Do not wear sandals. If you use a stepladder: Make sure that it is fully opened. Do not climb a closed stepladder. Make sure that both sides of the  stepladder are locked into place. Ask someone to hold it for you, if possible. Clearly mark and make sure that you can see: Any grab bars or handrails. First and last steps. Where the edge of each step is. Use tools that help you move around (mobility aids) if they are needed. These include: Canes. Walkers. Scooters. Crutches. Turn on the lights when you go into a dark area. Replace any light bulbs as soon as they burn out. Set up your furniture so you have a clear path. Avoid moving your furniture around. If any of your floors are uneven, fix them. If there are any pets around you, be aware of where they are. Review your medicines with your doctor. Some medicines can make you feel dizzy. This can increase your chance of falling. Ask your doctor what other things that you can do to help prevent falls. This information is not intended to replace advice given to you by your health care provider. Make sure you discuss any questions you have with your health care provider. Document Released: 09/13/2009 Document Revised: 04/24/2016 Document Reviewed: 12/22/2014 Elsevier Interactive Patient Education  2017 [employer].

## 2021-10-16 ENCOUNTER — Encounter: Payer: Self-pay | Admitting: Internal Medicine

## 2021-10-16 ENCOUNTER — Other Ambulatory Visit: Payer: Self-pay

## 2021-10-16 ENCOUNTER — Ambulatory Visit (INDEPENDENT_AMBULATORY_CARE_PROVIDER_SITE_OTHER): Payer: Medicare Other | Admitting: Internal Medicine

## 2021-10-16 VITALS — BP 116/60 | HR 55 | Temp 96.0°F | Ht 69.0 in | Wt 145.2 lb

## 2021-10-16 DIAGNOSIS — Z87898 Personal history of other specified conditions: Secondary | ICD-10-CM

## 2021-10-16 DIAGNOSIS — J029 Acute pharyngitis, unspecified: Secondary | ICD-10-CM | POA: Diagnosis not present

## 2021-10-16 DIAGNOSIS — E785 Hyperlipidemia, unspecified: Secondary | ICD-10-CM

## 2021-10-16 DIAGNOSIS — R7303 Prediabetes: Secondary | ICD-10-CM

## 2021-10-16 DIAGNOSIS — H259 Unspecified age-related cataract: Secondary | ICD-10-CM

## 2021-10-16 DIAGNOSIS — R5383 Other fatigue: Secondary | ICD-10-CM

## 2021-10-16 DIAGNOSIS — H269 Unspecified cataract: Secondary | ICD-10-CM | POA: Insufficient documentation

## 2021-10-16 LAB — LIPID PANEL
Cholesterol: 168 mg/dL (ref 0–200)
HDL: 67.5 mg/dL (ref >39.00)
LDL Cholesterol: 77 mg/dL (ref 0–99)
NonHDL: 100.19
Total CHOL/HDL Ratio: 2
Triglycerides: 114 mg/dL (ref 0.0–149.0)
VLDL: 22.8 mg/dL (ref 0.0–40.0)

## 2021-10-16 LAB — COMPREHENSIVE METABOLIC PANEL
ALT: 14 U/L (ref 0–53)
AST: 14 U/L (ref 0–37)
Albumin: 4.4 g/dL (ref 3.5–5.2)
Alkaline Phosphatase: 61 U/L (ref 39–117)
BUN: 18 mg/dL (ref 6–23)
CO2: 30 mEq/L (ref 19–32)
Calcium: 9.1 mg/dL (ref 8.4–10.5)
Chloride: 102 mEq/L (ref 96–112)
Creatinine, Ser: 0.95 mg/dL (ref 0.40–1.50)
GFR: 81.36 mL/min (ref >60.00)
Glucose, Bld: 95 mg/dL (ref 70–99)
Potassium: 4.3 mEq/L (ref 3.5–5.1)
Sodium: 139 mEq/L (ref 135–145)
Total Bilirubin: 0.6 mg/dL (ref 0.2–1.2)
Total Protein: 6.6 g/dL (ref 6.0–8.3)

## 2021-10-16 LAB — CBC WITH DIFFERENTIAL/PLATELET
Basophils Absolute: 0.1 10*3/uL (ref 0.0–0.1)
Basophils Relative: 1.1 % (ref 0.0–3.0)
Eosinophils Absolute: 0.2 10*3/uL (ref 0.0–0.7)
Eosinophils Relative: 4.6 % (ref 0.0–5.0)
HCT: 46.5 % (ref 39.0–52.0)
Hemoglobin: 15.5 g/dL (ref 13.0–17.0)
Lymphocytes Relative: 27.9 % (ref 12.0–46.0)
Lymphs Abs: 1.5 10*3/uL (ref 0.7–4.0)
MCHC: 33.3 g/dL (ref 30.0–36.0)
MCV: 93.1 fl (ref 78.0–100.0)
Monocytes Absolute: 0.4 10*3/uL (ref 0.1–1.0)
Monocytes Relative: 8.1 % (ref 3.0–12.0)
Neutro Abs: 3.1 10*3/uL (ref 1.4–7.7)
Neutrophils Relative %: 58.3 % (ref 43.0–77.0)
Platelets: 259 10*3/uL (ref 150.0–400.0)
RBC: 5 Mil/uL (ref 4.22–5.81)
RDW: 14 % (ref 11.5–15.5)
WBC: 5.3 10*3/uL (ref 4.0–10.5)

## 2021-10-16 LAB — TSH: TSH: 2.5 u[IU]/mL (ref 0.35–5.50)

## 2021-10-16 LAB — POC INFLUENZA A&B (BINAX/QUICKVUE)
Influenza A, POC: NEGATIVE
Influenza B, POC: NEGATIVE

## 2021-10-16 LAB — POCT RAPID STREP A (OFFICE): Rapid Strep A Screen: NEGATIVE

## 2021-10-16 LAB — MICROALBUMIN / CREATININE URINE RATIO
Creatinine,U: 178.7 mg/dL
Microalb Creat Ratio: 0.7 mg/g (ref 0.0–30.0)
Microalb, Ur: 1.3 mg/dL (ref 0.0–1.9)

## 2021-10-16 LAB — HEMOGLOBIN A1C: Hgb A1c MFr Bld: 6 % (ref 4.6–6.5)

## 2021-10-16 MED ORDER — ROSUVASTATIN CALCIUM 10 MG PO TABS: 10.0000 mg | ORAL_TABLET | Freq: Every day | ORAL | 3 refills | Status: DC

## 2021-10-16 NOTE — Patient Instructions (Signed)
No flu ,  no strep

## 2021-10-16 NOTE — Assessment & Plan Note (Signed)
Present for 4 days.  Exam notable for tonsillar erythema.  Strep and influenza testing dose as POC during visit

## 2021-10-16 NOTE — Assessment & Plan Note (Signed)
He has restricted his diet rather severely and even stopped his one glass of red wine nightly.  Advised him to follow a Mediterranean diet/lifestyle. annual A1c due   Lab Results  Component Value Date   HGBA1C 5.9 10/16/2020

## 2021-10-16 NOTE — Progress Notes (Signed)
Patient ID: Johnny Munoz, male    DOB: 10-Jun-1951  Age: 70 y.o. MRN: [id number]  The patient is here for follow up and  management of other chronic and acute problems.  This visit occurred during the SARS-CoV-2 public health emergency.  Safety protocols were in place, including screening questions prior to the visit, additional usage of staff PPE, and extensive cleaning of exam room while observing appropriate contact time as indicated for disinfecting solutions.     The risk factors are reflected in the social history.  The roster of all physicians providing medical care to patient - is listed in the Snapshot section of the chart.  Activities of daily living:  The patient is 100% independent in all ADLs: dressing, toileting, feeding as well as independent mobility  Home safety : The patient has smoke detectors in the home. They wear seatbelts.  There are no firearms at home. There is no violence in the home.   There is no risks for hepatitis, STDs or HIV. There is no   history of blood transfusion. They have no travel history to infectious disease endemic areas of the world.  The patient has seen their dentist in the last six month. They have seen their eye doctor in the last year. They admit to slight hearing difficulty with regard to whispered voices and some television programs.  They have deferred audiologic testing in the last year.  They do not  have excessive sun exposure. Discussed the need for sun protection: hats, long sleeves and use of sunscreen if there is significant sun exposure.   Diet: the importance of a healthy diet is discussed. They do have a healthy diet.  The benefits of regular aerobic exercise were discussed. She walks 4 times per week ,  20 minutes.   Depression screen: there are no signs or vegative symptoms of depression- irritability, change in appetite, anhedonia, sadness/tearfullness.  Cognitive assessment: the patient manages all their financial and  personal affairs and is actively engaged. They could relate day,date,year and events; recalled 2/3 objects at 3 minutes; performed clock-face test normally.  The following portions of the patient's history were reviewed and updated as appropriate: allergies, current medications, past family history, past medical history,  past surgical history, past social history  and problem list.  Visual acuity was not assessed per patient preference since she has regular follow up with her ophthalmologist. Hearing and body mass index were assessed and reviewed.   During the course of the visit the patient was educated and counseled about appropriate screening and preventive services including : fall prevention , diabetes screening, nutrition counseling, colorectal cancer screening, and recommended immunizations.    CC: The primary encounter diagnosis was Hyperlipidemia, unspecified hyperlipidemia type. Diagnoses of Prediabetes, Fatigue, unspecified type, Pharyngitis, unspecified etiology, History of elevated PSA, and Senile cataract of right eye, unspecified age-related cataract type were also pertinent to this visit.  PSA followed  annually by Dr Johnny Munoz  [facility] dermatology regularly.  No cancers  Colonoscopy 2014 reviewed:    Cc:  Treated for  acute sinusitis with z pack by urgent care .on Oct 3.  Finished  abx  no probiotic advised. Symptoms have resolved except  for sore throat which started 4 days ago after waking up from nap   Tonsillar erythema noted today .    Cataracts right eye seeing Dr Johnny Munoz in [facility] appt coming up. Doesn't like driving at night   Had flu vaccine 2 weeks ago.   [name]  has a past medical history of Allergy, Arthritis, BPH (benign prostatic hyperplasia), and Hyperlipidemia.   He has a past surgical history that includes Trans Urethral Microwave Therapy and Colonoscopy (2003, 10/12/13).   His family history includes Diabetes in his father; Heart disease in his  father; Prostate cancer in his maternal uncle.He reports that he has never smoked. He has never used smokeless tobacco. He reports current alcohol use of about 3.0 standard drinks per week. He reports that he does not use drugs.  Outpatient Medications Prior to Visit  Medication Sig Dispense Refill   acetaminophen (TYLENOL) 500 MG tablet Take 500 mg by mouth every 6 (six) hours as needed.     finasteride (PROSCAR) 5 MG tablet Take 1 tablet (5 mg total) by mouth daily. 90 tablet 3   fluticasone (FLONASE) 50 MCG/ACT nasal spray Place 2 sprays into both nostrils daily. 16 g 6   meloxicam (MOBIC) 15 MG tablet Take 1 tablet (15 mg total) by mouth daily as needed for pain. Take with food. 30 tablet 0   rosuvastatin (CRESTOR) 10 MG tablet Take 1 tablet (10 mg total) by mouth daily. 90 tablet 3   No facility-administered medications prior to visit.    Review of Systems  Patient denies headache, fevers, malaise, unintentional weight loss, skin rash, eye pain, sinus pain, t, dysphagia,  hemoptysis , cough, dyspnea, wheezing, chest pain, palpitations, orthopnea, edema, abdominal pain, nausea, melena, diarrhea, constipation, flank pain, dysuria, hematuria, urinary  Frequency, nocturia, numbness, tingling, seizures,  Focal weakness, Loss of consciousness,  Tremor, insomnia, depression, anxiety, and suicidal ideation.     Objective:  BP 116/60 (BP Location: Left Arm, Patient Position: Sitting, Cuff Size: Normal)   Pulse (!) 55   Temp (!) 96 F (35.6 C) (Temporal)   Ht _0  (1.753 m)   Wt 145 lb 3.2 oz (65.9 kg)   SpO2 96%   BMI 21.44 kg/m   Physical Exam  General appearance: alert, cooperative and appears stated age Ears: normal TM's and external ear canals both ears Throat: lips, mucosa, and tongue normal; teeth and gums normal mild tonsillar erythema without ulcerations Neck: no adenopathy, no carotid bruit, supple, symmetrical, trachea midline and thyroid not enlarged, symmetric, no  tenderness/mass/nodules Back: symmetric, no curvature. ROM normal. No CVA tenderness. Lungs: clear to auscultation bilaterally Heart: regular rate and rhythm, S1, S2 normal, no murmur, click, rub or gallop Abdomen: soft, non-tender; bowel sounds normal; no masses,  no organomegaly Pulses: 2+ and symmetric Skin: Skin color, texture, turgor normal. No rashes or lesions Lymph nodes: Cervical, supraclavicular, and axillary nodes normal.   Assessment & Plan:   Problem List Items Addressed This Visit     Hyperlipidemia, unspecified - Primary   Relevant Medications   rosuvastatin (CRESTOR) 10 MG tablet   Other Relevant Orders   Lipid Profile   History of elevated PSA    Now followed by Urology with annual pSA's. Last one Jan 2022      Prediabetes    He has restricted his diet rather severely and even stopped his one glass of red wine nightly.  Advised him to follow a Mediterranean diet/lifestyle. annual A1c due   Lab Results  Component Value Date   HGBA1C 5.9 10/16/2020         Relevant Orders   HgB A1c   Comp Met (CMET)   Urine Microalbumin w/creat. ratio   Pharyngitis    Present for 4 days.  Exam notable for tonsillar erythema.  Strep and  influenza testing dose as POC during visit       Relevant Orders   POCT rapid strep A   POC Influenza A&B (Binax test)   Cataract, right eye   Other Visit Diagnoses     Fatigue, unspecified type       Relevant Orders   TSH   CBC with Differential/Platelet       I am having Johnny Munoz maintain his acetaminophen, fluticasone, finasteride, meloxicam, and rosuvastatin.  Meds ordered this encounter  Medications   rosuvastatin (CRESTOR) 10 MG tablet    Sig: Take 1 tablet (10 mg total) by mouth daily.    Dispense:  90 tablet    Refill:  3    Medications Discontinued During This Encounter  Medication Reason   rosuvastatin (CRESTOR) 10 MG tablet Reorder    Follow-up: No follow-ups on file.   [name],  MD

## 2021-10-16 NOTE — Assessment & Plan Note (Signed)
Now followed by Urology with annual pSA's. Last one Jan 2022

## 2021-10-23 DIAGNOSIS — Z23 Encounter for immunization: Secondary | ICD-10-CM | POA: Diagnosis not present

## 2021-11-12 DIAGNOSIS — H2511 Age-related nuclear cataract, right eye: Secondary | ICD-10-CM | POA: Diagnosis not present

## 2021-11-12 DIAGNOSIS — H25043 Posterior subcapsular polar age-related cataract, bilateral: Secondary | ICD-10-CM | POA: Diagnosis not present

## 2021-11-12 DIAGNOSIS — H2513 Age-related nuclear cataract, bilateral: Secondary | ICD-10-CM | POA: Diagnosis not present

## 2021-11-12 DIAGNOSIS — H18413 Arcus senilis, bilateral: Secondary | ICD-10-CM | POA: Diagnosis not present

## 2021-11-12 DIAGNOSIS — H25013 Cortical age-related cataract, bilateral: Secondary | ICD-10-CM | POA: Diagnosis not present

## 2021-11-15 DIAGNOSIS — L820 Inflamed seborrheic keratosis: Secondary | ICD-10-CM | POA: Diagnosis not present

## 2021-11-15 DIAGNOSIS — D2272 Melanocytic nevi of left lower limb, including hip: Secondary | ICD-10-CM | POA: Diagnosis not present

## 2021-11-15 DIAGNOSIS — D2261 Melanocytic nevi of right upper limb, including shoulder: Secondary | ICD-10-CM | POA: Diagnosis not present

## 2021-11-15 DIAGNOSIS — Z85828 Personal history of other malignant neoplasm of skin: Secondary | ICD-10-CM | POA: Diagnosis not present

## 2021-11-15 DIAGNOSIS — D225 Melanocytic nevi of trunk: Secondary | ICD-10-CM | POA: Diagnosis not present

## 2021-11-15 DIAGNOSIS — L538 Other specified erythematous conditions: Secondary | ICD-10-CM | POA: Diagnosis not present

## 2021-12-16 ENCOUNTER — Other Ambulatory Visit: Payer: Self-pay

## 2021-12-16 DIAGNOSIS — Z87898 Personal history of other specified conditions: Secondary | ICD-10-CM

## 2021-12-16 DIAGNOSIS — N138 Other obstructive and reflux uropathy: Secondary | ICD-10-CM

## 2021-12-17 ENCOUNTER — Other Ambulatory Visit: Payer: Self-pay

## 2021-12-17 ENCOUNTER — Other Ambulatory Visit: Payer: Medicare Other

## 2021-12-17 DIAGNOSIS — Z87898 Personal history of other specified conditions: Secondary | ICD-10-CM | POA: Diagnosis not present

## 2021-12-17 DIAGNOSIS — N138 Other obstructive and reflux uropathy: Secondary | ICD-10-CM

## 2021-12-17 DIAGNOSIS — N401 Enlarged prostate with lower urinary tract symptoms: Secondary | ICD-10-CM | POA: Diagnosis not present

## 2021-12-18 LAB — PSA: Prostate Specific Ag, Serum: 1.3 ng/mL (ref 0.0–4.0)

## 2021-12-18 NOTE — Progress Notes (Signed)
12/19/2021  8:41 AM   [name] [birth year] [id number]  Referring provider: [name], MD [place] [place],  [place] [address]  Chief Complaint  Patient presents with   Benign Prostatic Hypertrophy   Urological history 1. Elevated PSA - Underwent a prostate biopsy in 10/2011 following a PSA of 4.1 that was benign. He was followed by Dr. [name] at [facility] at that time - PSA Trend Component     Latest Ref Rng & Units 12/10/2017 12/13/2018 12/19/2019 12/14/2020  Prostate Specific Ag, Serum     0.0 - 4.0 ng/mL 0.9 0.8 1.3 1.2   Component     Latest Ref Rng & Units 12/17/2021  Prostate Specific Ag, Serum     0.0 - 4.0 ng/mL 1.3  Corrected value 2.6  2. BPH with LU TS - I PSS: 8/1 - underwent TUMT with Dr. [name] in 2007 - on finasteride 5 mg daily  HPI: Johnny Munoz is a 71 y.o.  male who presents today for a yearly follow up.   He has frequency when he drinks a lot of fluid.  Nocturia x 1.  Patient denies any modifying or aggravating factors.  Patient denies any gross hematuria, dysuria or suprapubic/flank pain.  Patient denies any fevers, chills, nausea or vomiting.     IPSS     Row Name 12/19/21 0800         International Prostate Symptom Score   How often have you had the sensation of not emptying your bladder? Less than 1 in 5     How often have you had to urinate less than every two hours? Less than half the time     How often have you found you stopped and started again several times when you urinated? Less than 1 in 5 times     How often have you found it difficult to postpone urination? Less than 1 in 5 times     How often have you had a weak urinary stream? Less than 1 in 5 times     How often have you had to strain to start urination? Less than 1 in 5 times     How many times did you typically get up at night to urinate? 1 Time     Total IPSS Score 8       Quality of Life due to urinary symptoms   If you  were to spend the rest of your life with your urinary condition just the way it is now how would you feel about that? Pleased               Score:  1-7 Mild 8-19 Moderate 20-35 Severe   PMH: Past Medical History:  Diagnosis Date   Allergy    Arthritis    BPH (benign prostatic hyperplasia)    Hyperlipidemia     Surgical History: Past Surgical History:  Procedure Laterality Date   COLONOSCOPY  2003, 10/12/13   Dr. [name], Dr Hermine Messick Urethral Microwave Therapy      Home Medications:  Allergies as of 12/19/2021   No Known Allergies      Medication List        Accurate as of December 19, 2021  8:41 AM. If you have any questions, ask your nurse or doctor.          acetaminophen 500 MG tablet Commonly known as: TYLENOL Take 500 mg by mouth every 6 (six) hours as needed.  finasteride 5 MG tablet Commonly known as: PROSCAR Take 1 tablet (5 mg total) by mouth daily.   fluticasone 50 MCG/ACT nasal spray Commonly known as: FLONASE Place 2 sprays into both nostrils daily.   meloxicam 15 MG tablet Commonly known as: MOBIC Take 1 tablet (15 mg total) by mouth daily as needed for pain. Take with food.   rosuvastatin 10 MG tablet Commonly known as: CRESTOR Take 1 tablet (10 mg total) by mouth daily.       Allergies: No Known Allergies  Family History: Family History  Problem Relation Age of Onset   Diabetes Father    Heart disease Father        CABG after 75   Prostate cancer Maternal Uncle    Kidney cancer Neg Hx    Bladder Cancer Neg Hx     Social History:  reports that he has never smoked. He has never used smokeless tobacco. He reports current alcohol use of about 3.0 standard drinks per week. He reports that he does not use drugs.  Pertinent ROS in HPI  Physical Exam: BP 135/89    Pulse 73    Ht 5\' 9"  (1.753 m)    Wt 142 lb (64.4 kg)    BMI 20.97 kg/m   Constitutional:  Well nourished. Alert and oriented, No acute distress. HEENT:  [place] AT, mask in  place.  Trachea midline Cardiovascular: No clubbing, cyanosis, or edema. Respiratory: Normal respiratory effort, no increased work of breathing. GU: No CVA tenderness.  No bladder fullness or masses.  Patient with circumcised phallus.  Urethral meatus is patent.  No penile discharge. No penile lesions or rashes. Scrotum without lesions, cysts, rashes and/or edema.  Testicles are located scrotally bilaterally. No masses are appreciated in the testicles. Left and right epididymis are normal. Rectal: Patient with  normal sphincter tone. Anus and perineum without scarring or rashes. No rectal masses are appreciated. Prostate is approximately 50 grams, no nodules are appreciated. Seminal vesicles could not be palpated Neurologic: Grossly intact, no focal deficits, moving all 4 extremities. Psychiatric: Normal mood and affect.   Laboratory Data: Component     Latest Ref Rng & Units 12/10/2017 12/13/2018 12/19/2019 12/14/2020  Prostate Specific Ag, Serum     0.0 - 4.0 ng/mL 0.9 0.8 1.3 1.2   Component     Latest Ref Rng & Units 12/17/2021  Prostate Specific Ag, Serum     0.0 - 4.0 ng/mL 1.3    CBC Latest Ref Rng & Units 10/16/2021 10/11/2019 07/31/2018  WBC 4.0 - 10.5 K/uL 5.3 7.6 5.7  Hemoglobin 13.0 - 17.0 g/dL 15.5 15.6 16.3  Hematocrit 39.0 - 52.0 % 46.5 46.7 47.3  Platelets 150.0 - 400.0 K/uL 259.0 269.0 251    Component     Latest Ref Rng & Units 10/16/2021  TSH     0.35 - 5.50 uIU/mL 2.50   CMP Latest Ref Rng & Units 10/16/2021 04/09/2021 10/16/2020  Glucose 70 - 99 mg/dL 95 113(H) 104(H)  BUN 6 - 23 mg/dL 18 17 17   Creatinine 0.40 - 1.50 mg/dL 0.95 0.90 0.92  Sodium 135 - 145 mEq/L 139 139 139  Potassium 3.5 - 5.1 mEq/L 4.3 4.1 4.7  Chloride 96 - 112 mEq/L 102 102 101  CO2 19 - 32 mEq/L 30 29 30   Calcium 8.4 - 10.5 mg/dL 9.1 9.1 9.4  Total Protein 6.0 - 8.3 g/dL 6.6 6.5 6.6  Total Bilirubin 0.2 - 1.2 mg/dL 0.6 0.7 0.5  Alkaline Phos  39 - 117 U/L 61 55 51  AST 0 -  37 U/L 14 20 15   ALT 0 - 53 U/L 14 21 13     Lab Results  Component Value Date   HGBA1C 6.0 10/16/2021   Component     Latest Ref Rng & Units 10/16/2021  Cholesterol     0 - 200 mg/dL 168  Triglycerides     0.0 - 149.0 mg/dL 114.0  HDL Cholesterol     >39.00 mg/dL 67.50  VLDL     0.0 - 40.0 mg/dL 22.8  LDL (calc)     0 - 99 mg/dL 77  Total CHOL/HDL Ratio      2  NonHDL      100.19  I have reviewed the labs.   Pertinent Imaging N/A    Assessment & Plan:    1. BPH with LUTS -PSA stable -DRE benign -symptoms - frequency with increased fluids -continue conservative management, avoiding bladder irritants and timed voiding's -continue finasteride 5 mg daily-refills given   Return in about 1 year (around 12/19/2022) for IPSS, PSA and exam.  Zara Council, [facility] [place] [address] [place], [place] [address] [phone]

## 2021-12-19 ENCOUNTER — Ambulatory Visit (INDEPENDENT_AMBULATORY_CARE_PROVIDER_SITE_OTHER): Payer: Medicare Other | Admitting: Urology

## 2021-12-19 ENCOUNTER — Encounter: Payer: Self-pay | Admitting: Urology

## 2021-12-19 ENCOUNTER — Other Ambulatory Visit: Payer: Self-pay

## 2021-12-19 VITALS — BP 135/89 | HR 73 | Ht 69.0 in | Wt 142.0 lb

## 2021-12-19 DIAGNOSIS — N138 Other obstructive and reflux uropathy: Secondary | ICD-10-CM

## 2021-12-19 DIAGNOSIS — N401 Enlarged prostate with lower urinary tract symptoms: Secondary | ICD-10-CM | POA: Diagnosis not present

## 2021-12-19 MED ORDER — FINASTERIDE 5 MG PO TABS: 5.0000 mg | ORAL_TABLET | Freq: Every day | ORAL | 3 refills | Status: DC

## 2021-12-31 ENCOUNTER — Ambulatory Visit (INDEPENDENT_AMBULATORY_CARE_PROVIDER_SITE_OTHER): Payer: Medicare Other | Admitting: Adult Health

## 2021-12-31 ENCOUNTER — Encounter: Payer: Self-pay | Admitting: Adult Health

## 2021-12-31 ENCOUNTER — Other Ambulatory Visit: Payer: Self-pay

## 2021-12-31 VITALS — BP 110/78 | HR 65 | Temp 98.0°F | Resp 14 | Ht 69.0 in | Wt 149.0 lb

## 2021-12-31 DIAGNOSIS — L509 Urticaria, unspecified: Secondary | ICD-10-CM

## 2021-12-31 DIAGNOSIS — Z887 Allergy status to serum and vaccine status: Secondary | ICD-10-CM | POA: Diagnosis not present

## 2021-12-31 DIAGNOSIS — Z91018 Allergy to other foods: Secondary | ICD-10-CM

## 2021-12-31 LAB — CBC WITH DIFFERENTIAL/PLATELET
Basophils Absolute: 0 10*3/uL (ref 0.0–0.1)
Basophils Relative: 0.2 % (ref 0.0–3.0)
Eosinophils Absolute: 0.1 10*3/uL (ref 0.0–0.7)
Eosinophils Relative: 1.1 % (ref 0.0–5.0)
HCT: 46 % (ref 39.0–52.0)
Hemoglobin: 15.2 g/dL (ref 13.0–17.0)
Lymphocytes Relative: 18.3 % (ref 12.0–46.0)
Lymphs Abs: 1.5 10*3/uL (ref 0.7–4.0)
MCHC: 33.1 g/dL (ref 30.0–36.0)
MCV: 92.2 fl (ref 78.0–100.0)
Monocytes Absolute: 0.7 10*3/uL (ref 0.1–1.0)
Monocytes Relative: 8.3 % (ref 3.0–12.0)
Neutro Abs: 5.7 10*3/uL (ref 1.4–7.7)
Neutrophils Relative %: 72.1 % (ref 43.0–77.0)
Platelets: 271 10*3/uL (ref 150.0–400.0)
RBC: 4.99 Mil/uL (ref 4.22–5.81)
RDW: 14.1 % (ref 11.5–15.5)
WBC: 8 10*3/uL (ref 4.0–10.5)

## 2021-12-31 LAB — COMPREHENSIVE METABOLIC PANEL
ALT: 14 U/L (ref 0–53)
AST: 14 U/L (ref 0–37)
Albumin: 4.2 g/dL (ref 3.5–5.2)
Alkaline Phosphatase: 58 U/L (ref 39–117)
BUN: 18 mg/dL (ref 6–23)
CO2: 35 mEq/L — ABNORMAL HIGH (ref 19–32)
Calcium: 9.7 mg/dL (ref 8.4–10.5)
Chloride: 102 mEq/L (ref 96–112)
Creatinine, Ser: 1.06 mg/dL (ref 0.40–1.50)
GFR: 71.23 mL/min (ref >60.00)
Glucose, Bld: 85 mg/dL (ref 70–99)
Potassium: 4.3 mEq/L (ref 3.5–5.1)
Sodium: 141 mEq/L (ref 135–145)
Total Bilirubin: 0.6 mg/dL (ref 0.2–1.2)
Total Protein: 6.3 g/dL (ref 6.0–8.3)

## 2021-12-31 MED ORDER — FAMOTIDINE 20 MG PO TABS: 20.0000 mg | ORAL_TABLET | Freq: Every day | ORAL | 0 refills | Status: DC

## 2021-12-31 MED ORDER — CETIRIZINE HCL 10 MG PO TABS: 10.0000 mg | ORAL_TABLET | Freq: Every day | ORAL | 11 refills | Status: DC

## 2021-12-31 MED ORDER — EPINEPHRINE 0.3 MG/0.3ML IJ SOAJ: 0.3000 mg | INTRAMUSCULAR | 1 refills | Status: DC | PRN

## 2021-12-31 NOTE — Assessment & Plan Note (Signed)
Prefers not to be allergy tested at this time again.

## 2021-12-31 NOTE — Progress Notes (Signed)
Acute Office Visit  Subjective:    Patient ID: Johnny Munoz, male    DOB: 1951/05/23, 71 y.o.   MRN: [id number]  Chief Complaint  Patient presents with   Acute Visit    Rash, comes/goes ongoing for weeks. Has been allergy tested before and was allergic to mostly everything healthy.     Rash This is a recurrent problem. The current episode started 1 to 4 weeks ago. The problem is unchanged. The affected locations include the abdomen, torso, left lower leg and left upper leg. He was exposed to a new detergent/soap and a new medication. Associated symptoms include facial edema. Pertinent negatives include no anorexia, congestion, cough, diarrhea, eye pain, fatigue, fever, joint pain, nail changes, rhinorrhea, shortness of breath, sore throat or vomiting. Past treatments include anti-itch cream and antihistamine (improved with benadryl.). His past medical history is significant for allergies.   He has a couple of areas of hives on his right side left abdomen today.  Denies any recent illness or immunizations.  He has started eating grapes and more citrus fruits in the past month this is the only thing new.  He says the hives appear randomly and no known trigger over the past 1-2 months. Denies any new medications, soaps, detergents or products.   He reports today's hives are very mild, he has pictures of previous reactions on his phone. Denies any problems swallowing or swelling today.  Incidentally he also took covid shot in November he had jaw swelling, whelps on his right arm. He had no throat swelling but did have significant swelling of lips and jaw and face and had large red whelps on his arms. after [employer]  covid vaccine. He has been up to date on his 29th immunization is what this was . This resolved after a couple of days. He did take benadryl.   Has flu vaccine and seemed fine 2 weeks after.   He was allergy tested 10 plus years ago, multiple food allergies and was  allergic to many foods veggies. [place] peppers and pickles. He prefers not to have allergy testing again at this time. He will consider in future if not Improving.   Denies being tested again for allergies if able.   Denies any abdominal pain or back pain.  Patient  denies any fever, body aches,chills, rash, chest pain, any current shortness of breath, nausea, vomiting, or diarrhea.  Denies dizziness, lightheadedness, pre syncopal or syncopal episodes.    Past Medical History:  Diagnosis Date   Allergy    Arthritis    BPH (benign prostatic hyperplasia)    Hyperlipidemia     Past Surgical History:  Procedure Laterality Date   COLONOSCOPY  2003, 10/12/13   Dr. [name], Dr Hermine Messick Urethral Microwave Therapy      Family History  Problem Relation Age of Onset   Diabetes Father    Heart disease Father        CABG after 74   Prostate cancer Maternal Uncle    Kidney cancer Neg Hx    Bladder Cancer Neg Hx     Social History   Socioeconomic History   Marital status: Married    Spouse name: Not on file   Number of children: Not on file   Years of education: 16   Highest education level: Not on file  Occupational History   Occupation: Customer Service    Employer: [employer]   Occupation: Retired  Tobacco Use  Smoking status: Never   Smokeless tobacco: Never  Vaping Use   Vaping Use: Never used  Substance and Sexual Activity   Alcohol use: Yes    Alcohol/week: 3.0 standard drinks    Types: 3 drink(s) per week   Drug use: No   Sexual activity: Not on file  Other Topics Concern   Not on file  Social History Narrative   Regular exercise-yes   Caffeine Use-yes         Social Determinants of Health   Financial Resource Strain: Low Risk    Difficulty of Paying Living Expenses: Not hard at all  Food Insecurity: No Food Insecurity   Worried About Charity fundraiser in the Last Year: Never true   Ran Out of Food in the Last Year: Never true   Transportation Needs: No Transportation Needs   Lack of Transportation (Medical): No   Lack of Transportation (Non-Medical): No  Physical Activity: Sufficiently Active   Days of Exercise per Week: 5 days   Minutes of Exercise per Session: 40 min  Stress: No Stress Concern Present   Feeling of Stress : Not at all  Social Connections: Socially Integrated   Frequency of Communication with Friends and Family: Not on file   Frequency of Social Gatherings with Friends and Family: More than three times a week   Attends Religious Services: More than 4 times per year   Active Member of [employer] or Organizations: Yes   Attends Music therapist: More than 4 times per year   Marital Status: Married  Human resources officer Violence: Not At Risk   Fear of Current or Ex-Partner: No   Emotionally Abused: No   Physically Abused: No   Sexually Abused: No    Outpatient Medications Prior to Visit  Medication Sig Dispense Refill   acetaminophen (TYLENOL) 500 MG tablet Take 500 mg by mouth every 6 (six) hours as needed.     finasteride (PROSCAR) 5 MG tablet Take 1 tablet (5 mg total) by mouth daily. 90 tablet 3   fluticasone (FLONASE) 50 MCG/ACT nasal spray Place 2 sprays into both nostrils daily. 16 g 6   meloxicam (MOBIC) 15 MG tablet Take 1 tablet (15 mg total) by mouth daily as needed for pain. Take with food. 30 tablet 0   rosuvastatin (CRESTOR) 10 MG tablet Take 1 tablet (10 mg total) by mouth daily. 90 tablet 3   No facility-administered medications prior to visit.    Allergies  Allergen Reactions   Covid-19 Mrna Vaccine [employer]) [phone] Mrna Vacc (Moderna)]     Patient described lip swelling, whelps on his arm and facial swelling that was fairly extensive. Denied any breathing issues.     Review of Systems  Constitutional:  Negative for fatigue and fever.  HENT:  Negative for congestion, rhinorrhea and sore throat.   Eyes:  Negative for pain.  Respiratory:  Negative for cough and  shortness of breath.   Gastrointestinal:  Negative for anorexia, diarrhea and vomiting.  Musculoskeletal:  Negative for joint pain.  Skin:  Positive for rash. Negative for nail changes.      Objective:    Physical Exam Skin:    Findings: Rash present. Rash is macular.         General: Appearance:    Well developed, well nourished male in no acute distress  Eyes:    PERRL, conjunctiva/corneas clear, EOM's intact       Lungs:     Clear to auscultation bilaterally, respirations  unlabored  Heart:    Normal heart rate. Normal rhythm. No murmurs, rubs, or gallops.    MS:   All extremities are intact.    Neurologic:   Awake, alert, oriented x 3. No apparent focal neurological           defect.     BP 110/78 (BP Location: Left Arm, Patient Position: Sitting, Cuff Size: Small)    Pulse 65    Temp 98 F (36.7 C) (Oral)    Resp 14    Ht 5\' 9"  (1.753 m)    Wt 149 lb (67.6 kg)    SpO2 98%    BMI 22.00 kg/m  Wt Readings from Last 3 Encounters:  12/31/21 149 lb (67.6 kg)  12/19/21 142 lb (64.4 kg)  10/16/21 145 lb 3.2 oz (65.9 kg)    Health Maintenance Due  Topic Date Due   COVID-19 Vaccine (4 - Booster for Pfizer series) 10/26/2020    There are no preventive care reminders to display for this patient.   Lab Results  Component Value Date   TSH 2.50 10/16/2021   Lab Results  Component Value Date   WBC 5.3 10/16/2021   HGB 15.5 10/16/2021   HCT 46.5 10/16/2021   MCV 93.1 10/16/2021   PLT 259.0 10/16/2021   Lab Results  Component Value Date   NA 139 10/16/2021   K 4.3 10/16/2021   CO2 30 10/16/2021   GLUCOSE 95 10/16/2021   BUN 18 10/16/2021   CREATININE 0.95 10/16/2021   BILITOT 0.6 10/16/2021   ALKPHOS 61 10/16/2021   AST 14 10/16/2021   ALT 14 10/16/2021   PROT 6.6 10/16/2021   ALBUMIN 4.4 10/16/2021   CALCIUM 9.1 10/16/2021   ANIONGAP 7 07/31/2018   GFR 81.36 10/16/2021   Lab Results  Component Value Date   CHOL 168 10/16/2021   Lab Results  Component  Value Date   HDL 67.50 10/16/2021   Lab Results  Component Value Date   LDLCALC 77 10/16/2021   Lab Results  Component Value Date   TRIG 114.0 10/16/2021   Lab Results  Component Value Date   CHOLHDL 2 10/16/2021   Lab Results  Component Value Date   HGBA1C 6.0 10/16/2021       Assessment & Plan:   Problem List Items Addressed This Visit       Musculoskeletal and Integument   Hives - Primary   Relevant Medications   cetirizine (ZYRTEC) 10 MG tablet   famotidine (PEPCID) 20 MG tablet   EPINEPHrine 0.3 mg/0.3 mL IJ SOAJ injection   Other Relevant Orders   CBC with Differential/Platelet   Comprehensive metabolic panel     Other   Allergy to COVID-19 vaccine    He also took covid shot in November he had jaw swelling, whelps on his right arm immediately afterwards. He had no throat swelling but did have significant swelling of lips and jaw and face and had large red whelps on his arms. after [employer]  covid vaccine. He has been up to date on his 71th immunization is what this was . This resolved after a couple of days. He did take benadryl.  Discussed this with him, would recommend titer for  Antibodies in the future.  Discuss with his PCP. Do not recommend taking another COVID vaccine.  Epinephrine pen was given. Instruction video reviewed and also to have pharmacist teach him, reviewed only for anaphylaxis and reviewed symptoms and to immediately call 911 after use  if ever needed.        Multiple food allergies    Prefers not to be allergy tested at this time again.         Meds ordered this encounter  Medications   cetirizine (ZYRTEC) 10 MG tablet    Sig: Take 1 tablet (10 mg total) by mouth daily.    Dispense:  30 tablet    Refill:  11   famotidine (PEPCID) 20 MG tablet    Sig: Take 1 tablet (20 mg total) by mouth at bedtime.    Dispense:  90 tablet    Refill:  0   EPINEPHrine 0.3 mg/0.3 mL IJ SOAJ injection    Sig: Inject 0.3 mg into the muscle as  needed for anaphylaxis.    Dispense:  1 each    Refill:  1  CBC and CMP today at lab.  Start zyrtec and Pepcid as advised.  Keep benadryl on hand. ' keep log of diet and possible triggers exposures.  Discussed epinephrine and teaching and use for anaphylaxis and also how to use, have [pharmacy demonstrate and watch the video online. Return for nurse visit if more help is needed. Call 911 is used.    Recommend discussing antibodies at a later date for covid with PCP.  Will defer allergy testing at this time preference of patient.    Red Flags discussed. The patient was given clear instructions to go to ER or return to medical center if any red flags develop, symptoms do not improve, worsen or new problems develop. They verbalized understanding.  Return in about 6 weeks (around 02/11/2022), or if symptoms worsen or fail to improve, for at any time for any worsening symptoms, Go to Emergency room/ urgent care if worse.   Marcille Buffy, FNP

## 2021-12-31 NOTE — Progress Notes (Signed)
Cbc and cmp ok within normal limits. CO2 slightly elevated, we can recheck CMP in 1  month fasting.

## 2021-12-31 NOTE — Patient Instructions (Addendum)
Start Zyrtec daily for 3 weeks then continue as directed on package, then start Pepcid daily  as directed on package. Always keep benadryl on hand ok to take 25mg  to 50 mg. Will cause drowsiness benadryl if needed for allergic reaction.  Have pharmacist instruct on epi- pen use and only use if any anaphylaxis as discussed.   How to Use an Auto-Injector Pen An auto-injector pen (pre-filled automatic epinephrine injection device) is a device that is used to deliver epinephrine to the body. Epinephrine is a medicine that is given as a shot (injection). It works by relaxing the muscles in the airways and tightening the blood vessels. It is used to treat: A life-threatening allergic reaction (anaphylaxis). Serious breathing problems, such as severe asthma attacks, some lung problems, and other emergency conditions. An epinephrine injection can save your life. You should always carry an auto-injector pen with you if you are at risk for severe asthma attacks or anaphylaxis. You may hear other names for an auto-injector pen. They are epinephrine injection, epinephrine auto-injector pen, epinephrine pen, and automatic injection device. What are the risks? Using the auto-injector pen is safe. However, problems may arise, including: Damage to bone or tissue. Make sure that you correctly place the needle in the muscle of your outer thigh as told by your health care provider. When should I use my auto-injector pen? Use your auto-injector pen as soon as you think you are experiencing anaphylaxis or a severe asthma attack. Anaphylaxis is very dangerous if it is not treated right away. Signs and symptoms of anaphylaxis may include: Feeling warm in the face (flushed). This may include redness. Itchy, red, swollen areas of skin (hives). Swelling of the eyes, lips, face, mouth, tongue, or throat. Difficulty breathing, speaking, or swallowing. Noisy breathing (wheezing). Dizziness, light-headedness, or  fainting. Pain or cramping in the abdomen. Vomiting or diarrhea. These symptoms may represent a serious problem that is an emergency. Do not wait to see if the symptoms will go away. Use your auto-injector pen as you have been instructed, and get medical help right away. Call your local emergency services (911 in the [place] Do not drive yourself to the hospital. General tips for using an auto-injector pen  Use epinephrine exactly as told by your health care provider. Do not inject it more often or in greater or smaller doses than your health care provider told you. Most auto-injector pens contain one dose of epinephrine. Some contain two doses. Sit or lie down before giving yourself an injection or before receiving an injection from someone else. Use the auto-injector pen to give yourself an injection under your skin or into your muscle on the outer side of your thigh. Do not give yourself an injection into your buttocks or any other part of your body. In an emergency, you can use your auto-injector pen through your clothing. After you inject a dose of epinephrine, some liquid may remain in your auto-injector pen. This is normal. Replace your epinephrine immediately after you use your auto-injector pen. This is important if you have another reaction. If possible, carry two epinephrine auto-injector pens. If you need to give yourself a second dose of epinephrine, give the second injection in another area on your outer thigh. Do not give two injections in exactly the same place on your body. This can lead to tissue damage. From time to time: Check the expiration date on your auto-injector pen. Check the solution to ensure that it is not cloudy and that there are no  particles floating in it. If your auto-injector pen is expired or if the solution is cloudy or has particles floating in it, throw it away and get a new one. Ask your health care provider how to safely get rid of used or expired  auto-injector pens. Talk with your pharmacist or health care provider if you have questions about how to inject epinephrine correctly. Get help right away if: You inject epinephrine. If you use epinephrine, you must still get emergency medical treatment, even if the medicine seems to be working. You may need additional medical care, and you may need to be monitored for the side effects of epinephrine. The side effects include: Fast or irregular heartbeat. Nervousness or anxiety with shaking that does not stop. Difficulty breathing. Sweating. Headache. Nausea or vomiting. Dizziness or weakness. Summary An auto-injector pen (pre-filled automatic epinephrine injection device) is a device that is used to deliver epinephrine to the body. An auto-injector pen is used to treat a life-threatening allergic reaction (anaphylaxis), asthma attack, or other emergency conditions. You should always carry an auto-injector pen with you if you are at risk for anaphylaxis or severe asthma attacks. Use of this device is safe. However, bone or tissue damage can occur if you do not follow instructions for injecting the medicine. Talk with your pharmacist or health care provider if you have questions about how to inject epinephrine correctly. This information is not intended to replace advice given to you by your health care provider. Make sure you discuss any questions you have with your health care provider. Document Revised: 01/04/2021 Document Reviewed: 01/04/2021 Elsevier Patient Education  [place]. Cetirizine Tablets What is this medication? CETIRIZINE (se TI ra zeen) prevents and treats allergy symptoms, such as red, itchy eyes, sneezing, a runny or stuffy nose, or hives. It works by blocking histamine, a substance released by the body during an allergic reaction. It belongs to a group of medications called antihistamines. This medicine may be used for other purposes; ask your health care provider  or pharmacist if you have questions. COMMON BRAND NAME(S): All Day Allergy, Allergy Relief, Zyrtec, Zyrtec Hives Relief What should I tell my care team before I take this medication? They need to know if you have any of these conditions: Kidney disease Liver disease An unusual or allergic reaction to cetirizine, hydroxyzine, other medications, foods, dyes, or preservatives Pregnant or trying to get pregnant Breast-feeding How should I use this medication? Take this medication by mouth with a glass of water. Follow the directions on the prescription label. You can take this medication with food or on an empty stomach. Take your medication at regular times. Do not take more often than directed. You may need to take this medication for several days before your symptoms improve. Talk to your care team about the use of this medication in children. Special care may be needed. While this medication may be prescribed for children as young as 20 years of age for selected conditions, precautions do apply. Overdosage: If you think you have taken too much of this medicine contact a poison control center or emergency room at once. NOTE: This medicine is only for you. Do not share this medicine with others. What if I miss a dose? If you miss a dose, take it as soon as you can. If it is almost time for your next dose, take only that dose. Do not take double or extra doses. What may interact with this medication? Alcohol Certain medications for anxiety or  sleep Narcotic medications for pain Other medications for colds or allergies This list may not describe all possible interactions. Give your health care provider a list of all the medicines, herbs, non-prescription drugs, or dietary supplements you use. Also tell them if you smoke, drink alcohol, or use illegal drugs. Some items may interact with your medicine. What should I watch for while using this medication? Visit your care team for regular checks on  your health. Tell your care team if your symptoms do not improve. You may get drowsy or dizzy. Do not drive, use machinery, or do anything that needs mental alertness until you know how this medication affects you. Do not stand or sit up quickly, especially if you are an older patient. This reduces the risk of dizzy or fainting spells. Your mouth may get dry. Chewing sugarless gum or sucking hard candy, and drinking plenty of water may help. Contact your care team if the problem does not go away or is severe. What side effects may I notice from receiving this medication? Side effects that you should report to your care team as soon as possible: Allergic reactions--skin rash, itching, hives, swelling of the face, lips, tongue, or throat Side effects that usually do not require medical attention (report to your care team if they continue or are bothersome): Dizziness Drowsiness Dry mouth Fatigue This list may not describe all possible side effects. Call your doctor for medical advice about side effects. You may report side effects to FDA at 1-800-FDA-1088. Where should I keep my medication? Keep out of the reach of children. Store at room temperature between 15 and 30 degrees C (59 and 86 degrees F). Throw away any unused medication after the expiration date. NOTE: This sheet is a summary. It may not cover all possible information. If you have questions about this medicine, talk to your doctor, pharmacist, or health care provider.  2022 Elsevier/Gold Standard (2021-02-13 00:00:00) Famotidine Tablets What is this medication? FAMOTIDINE (fa MOE ti deen) treats heartburn, stomach ulcers, reflux disease, or other conditions that cause too much stomach acid. It works by reducing the amount of acid in the stomach. This medicine may be used for other purposes; ask your health care provider or pharmacist if you have questions. COMMON BRAND NAME(S): Heartburn Relief, Pepcid, Pepcid AC, Pepcid AC Maximum  Strength, Zantac, Zantac 360 What should I tell my care team before I take this medication? They need to know if you have any of these conditions: Kidney disease Liver disease Trouble swallowing An unusual or allergic reaction to famotidine, other medications, foods, dyes, or preservatives Pregnant or trying to get pregnant Breast-feeding How should I use this medication? Take this medication by mouth with a glass of water. Follow the directions on the prescription label. If you only take this medication once a day, take it at bedtime. Take your doses at regular intervals. Do not take your medication more often than directed. Talk to your care team about the use of this medication in children. Special care may be needed. Overdosage: If you think you have taken too much of this medicine contact a poison control center or emergency room at once. NOTE: This medicine is only for you. Do not share this medicine with others. What if I miss a dose? If you miss a dose, take it as soon as you can. If it is almost time for your next dose, take only that dose. Do not take double or extra doses. What may interact with this medication?  Delavirdine Itraconazole Ketoconazole This list may not describe all possible interactions. Give your health care provider a list of all the medicines, herbs, non-prescription drugs, or dietary supplements you use. Also tell them if you smoke, drink alcohol, or use illegal drugs. Some items may interact with your medicine. What should I watch for while using this medication? Tell your care team if your condition does not start to get better or if it gets worse. Do not take with aspirin, ibuprofen or other antiinflammatory medications. These can make your condition worse. Do not smoke cigarettes or drink alcohol. These cause irritation in your stomach and can increase the time it will take for ulcers to heal. If you get black, tarry stools or vomit up what looks like coffee  grounds, call your care team at once. You may have a bleeding ulcer. This medication may cause a decrease in vitamin B12. You should make sure that you get enough vitamin B12 while you are taking this medication. Discuss the foods you eat and the vitamins you take with your care team. What side effects may I notice from receiving this medication? Side effects that you should report to your care team as soon as possible: Allergic reactions--skin rash, itching, hives, swelling of the face, lips, tongue, or throat Confusion Hallucinations Side effects that usually do not require medical attention (report to your care team if they continue or are bothersome): Constipation Diarrhea Dizziness Headache This list may not describe all possible side effects. Call your doctor for medical advice about side effects. You may report side effects to FDA at 1-800-FDA-1088. Where should I keep my medication? Keep out of the reach of children and pets. Store at room temperature between 15 and 30 degrees C (59 and 86 degrees F). Do not freeze. Throw away any unused medication after the expiration date. NOTE: This sheet is a summary. It may not cover all possible information. If you have questions about this medicine, talk to your doctor, pharmacist, or health care provider.  2022 Elsevier/Gold Standard (2020-11-20 00:00:00)

## 2021-12-31 NOTE — Assessment & Plan Note (Signed)
He also took covid shot in November he had jaw swelling, whelps on his right arm immediately afterwards. He had no throat swelling but did have significant swelling of lips and jaw and face and had large red whelps on his arms. after [employer]  covid vaccine. He has been up to date on his 16th immunization is what this was . This resolved after a couple of days. He did take benadryl.  Discussed this with him, would recommend titer for  Antibodies in the future.  Discuss with his PCP. Do not recommend taking another COVID vaccine.  Epinephrine pen was given. Instruction video reviewed and also to have pharmacist teach him, reviewed only for anaphylaxis and reviewed symptoms and to immediately call 911 after use if ever needed.

## 2022-01-10 ENCOUNTER — Other Ambulatory Visit: Payer: Self-pay

## 2022-01-10 ENCOUNTER — Encounter: Payer: Self-pay | Admitting: Internal Medicine

## 2022-01-10 ENCOUNTER — Telehealth (INDEPENDENT_AMBULATORY_CARE_PROVIDER_SITE_OTHER): Payer: Medicare Other | Admitting: Internal Medicine

## 2022-01-10 VITALS — Ht 69.0 in | Wt 142.0 lb

## 2022-01-10 DIAGNOSIS — J40 Bronchitis, not specified as acute or chronic: Secondary | ICD-10-CM

## 2022-01-10 MED ORDER — AZITHROMYCIN 250 MG PO TABS: ORAL_TABLET | ORAL | 0 refills | Status: AC

## 2022-01-10 MED ORDER — PREDNISONE 20 MG PO TABS: 40.0000 mg | ORAL_TABLET | Freq: Every day | ORAL | 0 refills | Status: DC

## 2022-01-10 MED ORDER — ALBUTEROL SULFATE HFA 108 (90 BASE) MCG/ACT IN AERS: 2.0000 | INHALATION_SPRAY | Freq: Four times a day (QID) | RESPIRATORY_TRACT | 0 refills | Status: DC | PRN

## 2022-01-10 NOTE — Progress Notes (Signed)
Virtual Visit via Video Note  I connected with Johnny Munoz  on 01/10/22 at  1:02 PM EST by a video enabled telemedicine application and verified that I am speaking with the correct person using two identifiers.  Location patient: [place] Location provider:work or home office Persons participating in the virtual visit: patient, provider  I discussed the limitations and requested verbal permission for telemedicine visit. The patient expressed understanding and agreed to proceed.   HPI:  Acute telemedicine visit for : Cough x 2 weeks neg covid test no sick contacts not smoker clear mucous at times yellow, +wheezing tried robitussin dm no fever energey ok no h/o asthma leaving for [place] in 1 week   -Pertinent past medical history: see below -Pertinent medication allergies: Allergies  Allergen Reactions   Covid-19 Mrna Vaccine [employer]) [phone] Mrna Vacc (Moderna)]     Patient described lip swelling, whelps on his arm and facial swelling that was fairly extensive. Denied any breathing issues.    -COVID-19 vaccine status:  Immunization History  Administered Date(s) Administered   Fluad Quad(high Dose 65+) 08/27/2019, 09/30/2021   Influenza, High Dose Seasonal PF 08/25/2017, 09/07/2020   Influenza,inj,Quad PF,6+ Mos 08/11/2014, 09/14/2015, 09/17/2018   Influenza-Unspecified 08/31/2012   PFIZER(Purple Top)SARS-COV-2 Vaccination 01/13/2020, 02/07/2020, 08/31/2020   PNEUMOCOCCAL CONJUGATE-20 04/11/2021   Pfizer Covid-19 Vaccine Bivalent Booster 69yrs & up 08/31/2021   Pneumococcal Conjugate-13 08/25/2017   Td 12/30/2007   Tdap 07/31/2018   Zoster, Live 10/10/2010     ROS: See pertinent positives and negatives per HPI.  Past Medical History:  Diagnosis Date   Allergy    Arthritis    BPH (benign prostatic hyperplasia)    Hyperlipidemia     Past Surgical History:  Procedure Laterality Date   COLONOSCOPY  2003, 10/12/13   Dr. [name], Dr Hermine Messick Urethral Microwave  Therapy       Current Outpatient Medications:    acetaminophen (TYLENOL) 500 MG tablet, Take 500 mg by mouth every 6 (six) hours as needed., Disp: , Rfl:    albuterol (VENTOLIN HFA) 108 (90 Base) MCG/ACT inhaler, Inhale 2 puffs into the lungs every 6 (six) hours as needed for wheezing or shortness of breath., Disp: 18 g, Rfl: 0   azithromycin (ZITHROMAX) 250 MG tablet, Take 2 tablets on day 1, then 1 tablet daily on days 2 through 5, Disp: 6 tablet, Rfl: 0   cetirizine (ZYRTEC) 10 MG tablet, Take 1 tablet (10 mg total) by mouth daily., Disp: 30 tablet, Rfl: 11   finasteride (PROSCAR) 5 MG tablet, Take 1 tablet (5 mg total) by mouth daily., Disp: 90 tablet, Rfl: 3   fluticasone (FLONASE) 50 MCG/ACT nasal spray, Place 2 sprays into both nostrils daily., Disp: 16 g, Rfl: 6   meloxicam (MOBIC) 15 MG tablet, Take 1 tablet (15 mg total) by mouth daily as needed for pain. Take with food., Disp: 30 tablet, Rfl: 0   predniSONE (DELTASONE) 20 MG tablet, Take 2 tablets (40 mg total) by mouth daily with breakfast. 5 days, Disp: 10 tablet, Rfl: 0   rosuvastatin (CRESTOR) 10 MG tablet, Take 1 tablet (10 mg total) by mouth daily., Disp: 90 tablet, Rfl: 3   EPINEPHrine 0.3 mg/0.3 mL IJ SOAJ injection, Inject 0.3 mg into the muscle as needed for anaphylaxis., Disp: 1 each, Rfl: 1   famotidine (PEPCID) 20 MG tablet, Take 1 tablet (20 mg total) by mouth at bedtime. (Patient not taking: Reported on 01/10/2022), Disp: 90 tablet, Rfl: 0  EXAM:  VITALS per patient if applicable:  GENERAL: alert, oriented, appears well and in no acute distress  HEENT: atraumatic, conjunttiva clear, no obvious abnormalities on inspection of external nose and ears  NECK: normal movements of the head and neck  LUNGS: on inspection no signs of respiratory distress, breathing rate appears normal, no obvious gross SOB, gasping or wheezing  CV: no obvious cyanosis  MS: moves all visible extremities without noticeable  abnormality  PSYCH/NEURO: pleasant and cooperative, no obvious depression or anxiety, speech and thought processing grossly intact  ASSESSMENT AND PLAN:  Discussed the following assessment and plan:  Bronchitis - Plan: albuterol (VENTOLIN HFA) 108 (90 Base) MCG/ACT inhaler, azithromycin (ZITHROMAX) 250 MG tablet, predniSONE (DELTASONE) 20 MG tablet, DG Chest 2 View  -we discussed possible serious and likely etiologies, options for evaluation and workup, limitations of telemedicine visit vs in person visit, treatment, treatment risks and precautions. Pt is agreeable to treatment via telemedicine at this moment.   I discussed the assessment and treatment plan with the patient. The patient was provided an opportunity to ask questions and all were answered. The patient agreed with the plan and demonstrated an understanding of the instructions.    Time spent 20 min Delorise Jackson, MD

## 2022-01-13 ENCOUNTER — Ambulatory Visit
Admission: RE | Admit: 2022-01-13 | Discharge: 2022-01-13 | Disposition: A | Discharge status: Home / Self Care | Payer: Medicare Other | Attending: Internal Medicine | Admitting: Internal Medicine

## 2022-01-13 ENCOUNTER — Other Ambulatory Visit: Payer: Self-pay

## 2022-01-13 ENCOUNTER — Ambulatory Visit
Admission: RE | Admit: 2022-01-13 | Discharge: 2022-01-13 | Disposition: A | Discharge status: Home / Self Care | Payer: Medicare Other | Source: Ambulatory Visit | Attending: Internal Medicine | Admitting: Internal Medicine

## 2022-01-13 DIAGNOSIS — R059 Cough, unspecified: Secondary | ICD-10-CM | POA: Diagnosis not present

## 2022-01-13 DIAGNOSIS — J309 Allergic rhinitis, unspecified: Secondary | ICD-10-CM | POA: Diagnosis not present

## 2022-01-13 DIAGNOSIS — M47812 Spondylosis without myelopathy or radiculopathy, cervical region: Secondary | ICD-10-CM | POA: Diagnosis not present

## 2022-01-13 DIAGNOSIS — J40 Bronchitis, not specified as acute or chronic: Secondary | ICD-10-CM | POA: Diagnosis not present

## 2022-01-13 DIAGNOSIS — H900 Conductive hearing loss, bilateral: Secondary | ICD-10-CM | POA: Diagnosis not present

## 2022-01-13 DIAGNOSIS — H6123 Impacted cerumen, bilateral: Secondary | ICD-10-CM | POA: Diagnosis not present

## 2022-01-22 ENCOUNTER — Other Ambulatory Visit: Payer: Self-pay | Admitting: *Deleted

## 2022-01-22 DIAGNOSIS — R7981 Abnormal blood-gas level: Secondary | ICD-10-CM

## 2022-01-30 ENCOUNTER — Other Ambulatory Visit: Payer: Self-pay

## 2022-01-30 ENCOUNTER — Other Ambulatory Visit (INDEPENDENT_AMBULATORY_CARE_PROVIDER_SITE_OTHER): Payer: Medicare Other

## 2022-01-30 DIAGNOSIS — R7981 Abnormal blood-gas level: Secondary | ICD-10-CM

## 2022-01-30 LAB — COMPREHENSIVE METABOLIC PANEL
ALT: 24 U/L (ref 0–53)
AST: 16 U/L (ref 0–37)
Albumin: 4.4 g/dL (ref 3.5–5.2)
Alkaline Phosphatase: 55 U/L (ref 39–117)
BUN: 17 mg/dL (ref 6–23)
CO2: 31 mEq/L (ref 19–32)
Calcium: 9.1 mg/dL (ref 8.4–10.5)
Chloride: 103 mEq/L (ref 96–112)
Creatinine, Ser: 1.05 mg/dL (ref 0.40–1.50)
GFR: 72 mL/min (ref >60.00)
Glucose, Bld: 97 mg/dL (ref 70–99)
Potassium: 4.3 mEq/L (ref 3.5–5.1)
Sodium: 139 mEq/L (ref 135–145)
Total Bilirubin: 0.6 mg/dL (ref 0.2–1.2)
Total Protein: 6.6 g/dL (ref 6.0–8.3)

## 2022-02-03 DIAGNOSIS — H25011 Cortical age-related cataract, right eye: Secondary | ICD-10-CM | POA: Diagnosis not present

## 2022-02-03 DIAGNOSIS — H2511 Age-related nuclear cataract, right eye: Secondary | ICD-10-CM | POA: Diagnosis not present

## 2022-02-03 DIAGNOSIS — H52201 Unspecified astigmatism, right eye: Secondary | ICD-10-CM | POA: Diagnosis not present

## 2022-02-03 HISTORY — PX: OTHER SURGICAL HISTORY: SHX169

## 2022-02-03 NOTE — Progress Notes (Signed)
CMP is within normal limits. Keep health maintenance follow up's with PCP.

## 2022-02-04 DIAGNOSIS — H2512 Age-related nuclear cataract, left eye: Secondary | ICD-10-CM | POA: Diagnosis not present

## 2022-02-12 ENCOUNTER — Other Ambulatory Visit: Payer: Self-pay

## 2022-02-12 ENCOUNTER — Encounter: Payer: Self-pay | Admitting: Internal Medicine

## 2022-02-12 ENCOUNTER — Ambulatory Visit (INDEPENDENT_AMBULATORY_CARE_PROVIDER_SITE_OTHER): Payer: Medicare Other | Admitting: Internal Medicine

## 2022-02-12 VITALS — BP 128/94 | HR 78 | Temp 98.3°F | Ht 69.0 in | Wt 150.8 lb

## 2022-02-12 DIAGNOSIS — Z87898 Personal history of other specified conditions: Secondary | ICD-10-CM | POA: Diagnosis not present

## 2022-02-12 DIAGNOSIS — Z8709 Personal history of other diseases of the respiratory system: Secondary | ICD-10-CM | POA: Diagnosis not present

## 2022-02-12 DIAGNOSIS — L509 Urticaria, unspecified: Secondary | ICD-10-CM | POA: Diagnosis not present

## 2022-02-12 DIAGNOSIS — Z9841 Cataract extraction status, right eye: Secondary | ICD-10-CM | POA: Diagnosis not present

## 2022-02-12 DIAGNOSIS — R7303 Prediabetes: Secondary | ICD-10-CM

## 2022-02-12 DIAGNOSIS — E785 Hyperlipidemia, unspecified: Secondary | ICD-10-CM | POA: Diagnosis not present

## 2022-02-12 MED ORDER — FEXOFENADINE HCL 180 MG PO TABS: 180.0000 mg | ORAL_TABLET | Freq: Every day | ORAL | 1 refills | Status: DC

## 2022-02-12 NOTE — Assessment & Plan Note (Signed)
Now followed by Urology with annual pSA's. No change since 2021  Lab Results  Component Value Date   PSA1 1.3 12/17/2021   PSA1 1.2 12/14/2020   PSA1 1.3 12/19/2019   PSA 1.0 08/27/2016   PSA 0.75 03/06/2016   PSA 2.11 06/28/2013     

## 2022-02-12 NOTE — Patient Instructions (Addendum)
?  To prevent hives and allergy attacks: ? ?Fexofenadine is Allegra  180  mg dose once or twice daily   for ever   ? ?PLUS ? ?Famotidine 20 mg twice daily ?

## 2022-02-12 NOTE — Assessment & Plan Note (Signed)
Resolved by repeat exam.  He has no history of wheezing and did not use the albuterol.  Recent chest x ray reviewed  ?

## 2022-02-12 NOTE — Assessment & Plan Note (Signed)
Given his multiple occurrences , recommend contnued use of a non sedating antihistamine and famotidine .  Changing zyrtec to allegra  ?

## 2022-02-12 NOTE — Progress Notes (Signed)
? ?Subjective:  ?Patient ID: Johnny Munoz, male    DOB: 11/16/1951  Age: 71 y.o. MRN: [id number] ? ?CC: The primary encounter diagnosis was Hyperlipidemia, unspecified hyperlipidemia type. Diagnoses of Hives, Prediabetes, History of acute bronchitis, S/P cataract surgery, right, and History of elevated PSA were also pertinent to this visit. ? ? ?This visit occurred during the SARS-CoV-2 public health emergency.  Safety protocols were in place, including screening questions prior to the visit, additional usage of staff PPE, and extensive cleaning of exam room while observing appropriate contact time as indicated for disinfecting solutions.   ? ?HPI ?Johnny Munoz presents for  ?Chief Complaint  ?Patient presents with  ? Follow-up  ?  6 week follow up   ? ?1) Recently treated for bronchitis on  Feb 10  by Dr Aundra Dubin via virtual visit.  COVID NEGATIVE . Had a chest x ray noted bronchitis.  Never used the albuterol  ? ?2) RECURRENT HIVES.   First episode was  a reaction  TO PFIZER COVID BOOSTER VACCINE IN November.  Occurred one day later ,  left side of face swelled,  right arm developed a large bullous.  .  Recalls having several days of loud tinnitus after first COVIID vaccine.  Has had COVID infection with minimal symptoms , Has had recurrent hives since then, attributed to food allergies . Taking famotidine and zyrtec; wife says he has been irritable and moody since he started medication ? ?3) bilateral cataract surgery , right eye was done MARCH 6 IN [place] by Tommy Rainwater  without  complications  ? ?Outpatient Medications Prior to Visit  ?Medication Sig Dispense Refill  ? acetaminophen (TYLENOL) 500 MG tablet Take 500 mg by mouth every 6 (six) hours as needed.    ? famotidine (PEPCID) 20 MG tablet Take 1 tablet (20 mg total) by mouth at bedtime. 90 tablet 0  ? finasteride (PROSCAR) 5 MG tablet Take 1 tablet (5 mg total) by mouth daily. 90 tablet 3  ? fluticasone (FLONASE) 50 MCG/ACT nasal spray Place 2  sprays into both nostrils daily. 16 g 6  ? gatifloxacin (ZYMAXID) 0.5 % SOLN SMARTSIG:In Eye(s)    ? ketorolac (ACULAR) 0.5 % ophthalmic solution SMARTSIG:In Eye(s)    ? meloxicam (MOBIC) 15 MG tablet Take 1 tablet (15 mg total) by mouth daily as needed for pain. Take with food. 30 tablet 0  ? prednisoLONE acetate (PRED FORTE) 1 % ophthalmic suspension SMARTSIG:In Eye(s)    ? rosuvastatin (CRESTOR) 10 MG tablet Take 1 tablet (10 mg total) by mouth daily. 90 tablet 3  ? cetirizine (ZYRTEC) 10 MG tablet Take 1 tablet (10 mg total) by mouth daily. 30 tablet 11  ? albuterol (VENTOLIN HFA) 108 (90 Base) MCG/ACT inhaler Inhale 2 puffs into the lungs every 6 (six) hours as needed for wheezing or shortness of breath. 18 g 0  ? EPINEPHrine 0.3 mg/0.3 mL IJ SOAJ injection Inject 0.3 mg into the muscle as needed for anaphylaxis. 1 each 1  ? predniSONE (DELTASONE) 20 MG tablet Take 2 tablets (40 mg total) by mouth daily with breakfast. 5 days 10 tablet 0  ? ?No facility-administered medications prior to visit.  ? ? ?Review of Systems; ? ?Patient denies headache, fevers, malaise, unintentional weight loss, skin rash, eye pain, sinus congestion and sinus pain, sore throat, dysphagia,  hemoptysis , cough, dyspnea, wheezing, chest pain, palpitations, orthopnea, edema, abdominal pain, nausea, melena, diarrhea, constipation, flank pain, dysuria, hematuria, urinary  Frequency, nocturia, numbness, tingling, seizures,  Focal weakness, Loss of consciousness,  Tremor, insomnia, depression, anxiety, and suicidal ideation.   ? ? ? ?Objective:  ?BP (!) 128/94 (BP Location: Left Arm, Patient Position: Sitting, Cuff Size: Normal)   Pulse 78   Temp 98.3 ?F (36.8 ?C) (Oral)   Ht '5\' 9"'$  (1.753 m)   Wt 150 lb 12.8 oz (68.4 kg)   SpO2 95%   BMI 22.27 kg/m?  ? ?BP Readings from Last 3 Encounters:  ?02/12/22 (!) 128/94  ?12/31/21 110/78  ?12/19/21 135/89  ? ? ?Wt Readings from Last 3 Encounters:  ?02/12/22 150 lb 12.8 oz (68.4 kg)  ?01/10/22 142  lb (64.4 kg)  ?12/31/21 149 lb (67.6 kg)  ? ? ?General appearance: alert, cooperative and appears stated age ?Ears: normal TM's and external ear canals both ears ?Throat: lips, mucosa, and tongue normal; teeth and gums normal ?Neck: no adenopathy, no carotid bruit, supple, symmetrical, trachea midline and thyroid not enlarged, symmetric, no tenderness/mass/nodules ?Back: symmetric, no curvature. ROM normal. No CVA tenderness. ?Lungs: clear to auscultation bilaterally ?Heart: regular rate and rhythm, S1, S2 normal, no murmur, click, rub or gallop ?Abdomen: soft, non-tender; bowel sounds normal; no masses,  no organomegaly ?Pulses: 2+ and symmetric ?Skin: Skin color, texture, turgor normal. No rashes or lesions ?Lymph nodes: Cervical, supraclavicular, and axillary nodes normal. ? ?Lab Results  ?Component Value Date  ? HGBA1C 6.0 10/16/2021  ? HGBA1C 5.9 10/16/2020  ? HGBA1C 5.8 10/11/2019  ? ? ?Lab Results  ?Component Value Date  ? CREATININE 1.05 01/30/2022  ? CREATININE 1.06 12/31/2021  ? CREATININE 0.95 10/16/2021  ? ? ?Lab Results  ?Component Value Date  ? WBC 8.0 12/31/2021  ? HGB 15.2 12/31/2021  ? HCT 46.0 12/31/2021  ? PLT 271.0 12/31/2021  ? GLUCOSE 97 01/30/2022  ? CHOL 168 10/16/2021  ? TRIG 114.0 10/16/2021  ? HDL 67.50 10/16/2021  ? LDLDIRECT 120.8 12/29/2013  ? [place] 77 10/16/2021  ? ALT 24 01/30/2022  ? AST 16 01/30/2022  ? NA 139 01/30/2022  ? K 4.3 01/30/2022  ? CL 103 01/30/2022  ? CREATININE 1.05 01/30/2022  ? BUN 17 01/30/2022  ? CO2 31 01/30/2022  ? TSH 2.50 10/16/2021  ? PSA 1.0 08/27/2016  ? HGBA1C 6.0 10/16/2021  ? MICROALBUR 1.3 10/16/2021  ? ? ?DG Chest 2 View ? ?Result Date: 01/13/2022 ?CLINICAL DATA:  71 year old male with cough EXAM: CHEST - 2 VIEW COMPARISON:  None. FINDINGS: Cardiomediastinal silhouette within normal limits in size and contour. No evidence of central vascular congestion. No interlobular septal thickening. No pneumothorax or pleural effusion. Coarsened interstitial  markings, with no confluent airspace disease. No acute displaced fracture. Degenerative changes of the spine. IMPRESSION: No active cardiopulmonary disease. Electronically Signed   By: Corrie Mckusick D.O.   On: 01/13/2022 12:18  ? ? ?Assessment & Plan:  ? ?Problem List Items Addressed This Visit   ? ? Hyperlipidemia, unspecified - Primary  ? Relevant Orders  ? Comprehensive metabolic panel  ? Lipid panel  ? History of elevated PSA  ?  Now followed by Urology with annual pSA's. No change since 2021 ? ?Lab Results  ?Component Value Date  ? PSA1 1.3 12/17/2021  ? PSA1 1.2 12/14/2020  ? PSA1 1.3 12/19/2019  ? PSA 1.0 08/27/2016  ? PSA 0.75 03/06/2016  ? PSA 2.11 06/28/2013  ? ? ? ?  ?  ? Prediabetes  ? Relevant Orders  ? Hemoglobin A1c  ? Hives  ?  Given his multiple occurrences ,  recommend contnued use of a non sedating antihistamine and famotidine .  Changing zyrtec to allegra  ?  ?  ? Relevant Orders  ? CBC with Differential/Platelet  ? History of acute bronchitis  ?  Resolved by repeat exam.  He has no history of wheezing and did not use the albuterol.  Recent chest x ray reviewed  ?  ?  ? S/P cataract surgery, right  ? ? ?I spent 30 minutes dedicated to the care of this patient on the date of this encounter to include pre-visit review of patient's medical history,  most recent imaging studies, Face-to-face time with the patient , and post visit ordering of testing and therapeutics.   ? ?Follow-up: Return in about 6 months (around 08/15/2022). ? ? ?[name], MD ?

## 2022-02-17 DIAGNOSIS — H2512 Age-related nuclear cataract, left eye: Secondary | ICD-10-CM | POA: Diagnosis not present

## 2022-02-17 DIAGNOSIS — H25012 Cortical age-related cataract, left eye: Secondary | ICD-10-CM | POA: Diagnosis not present

## 2022-04-03 ENCOUNTER — Other Ambulatory Visit: Payer: Self-pay

## 2022-04-03 DIAGNOSIS — L509 Urticaria, unspecified: Secondary | ICD-10-CM

## 2022-04-03 MED ORDER — FAMOTIDINE 20 MG PO TABS: 20.0000 mg | ORAL_TABLET | Freq: Every day | ORAL | 0 refills | Status: DC

## 2022-06-26 ENCOUNTER — Encounter: Payer: Self-pay | Admitting: Internal Medicine

## 2022-06-26 ENCOUNTER — Ambulatory Visit (INDEPENDENT_AMBULATORY_CARE_PROVIDER_SITE_OTHER): Payer: Medicare Other

## 2022-06-26 ENCOUNTER — Ambulatory Visit (INDEPENDENT_AMBULATORY_CARE_PROVIDER_SITE_OTHER): Payer: Medicare Other | Admitting: Internal Medicine

## 2022-06-26 VITALS — BP 128/76 | HR 81 | Temp 98.4°F | Ht 69.0 in | Wt 147.0 lb

## 2022-06-26 DIAGNOSIS — M79651 Pain in right thigh: Secondary | ICD-10-CM

## 2022-06-26 DIAGNOSIS — M25551 Pain in right hip: Secondary | ICD-10-CM | POA: Diagnosis not present

## 2022-06-26 MED ORDER — TRAMADOL HCL 50 MG PO TABS: 50.0000 mg | ORAL_TABLET | Freq: Two times a day (BID) | ORAL | 0 refills | Status: AC | PRN

## 2022-06-26 NOTE — Progress Notes (Signed)
Chief Complaint  Patient presents with   Leg Pain    Pt is experiencing leg pain from the knee up while standing he says its more like a dull ache versus a sharp pain or throbbing been having these symptoms for 2 1/2 wks.   F/u  1. Right thigh pain x 2.5 weeks worse with standing up or getting up not with sitting pain 6/10 dull tried advil min relief and tried mobic ? Doses pain dull ache limits physical activity I.e walking Pain is radiating right upper to lower thigh     Review of Systems  Constitutional:  Negative for weight loss.  HENT:  Negative for hearing loss.   Eyes:  Negative for blurred vision.  Respiratory:  Negative for shortness of breath.   Cardiovascular:  Negative for chest pain.  Gastrointestinal:  Negative for abdominal pain and blood in stool.  Musculoskeletal:  Positive for joint pain. Negative for back pain.  Skin:  Negative for rash.  Neurological:  Negative for headaches.  Psychiatric/Behavioral:  Negative for depression.    Past Medical History:  Diagnosis Date   Allergy    Arthritis    BPH (benign prostatic hyperplasia)    Hyperlipidemia    Past Surgical History:  Procedure Laterality Date   cataract Right 02/03/2022   COLONOSCOPY  2003, 10/12/13   Dr. [name], Dr Hermine Messick Urethral Microwave Therapy     Family History  Problem Relation Age of Onset   Diabetes Father    Heart disease Father        CABG after 38   Prostate cancer Maternal Uncle    Kidney cancer Neg Hx    Bladder Cancer Neg Hx    Social History   Socioeconomic History   Marital status: Married    Spouse name: Not on file   Number of children: Not on file   Years of education: 16   Highest education level: Not on file  Occupational History   Occupation: Therapist, art    Employer: [employer]   Occupation: Retired  Tobacco Use   Smoking status: Never   Smokeless tobacco: Never  Vaping Use   Vaping Use: Never used  Substance and Sexual Activity    Alcohol use: Yes    Alcohol/week: 3.0 standard drinks of alcohol    Types: 3 drink(s) per week   Drug use: No   Sexual activity: Not on file  Other Topics Concern   Not on file  Social History Narrative   Regular exercise-yes   Caffeine Use-yes         Social Determinants of Health   Financial Resource Strain: Low Risk  (10/02/2021)   Overall Financial Resource Strain (CARDIA)    Difficulty of Paying Living Expenses: Not hard at all  Food Insecurity: No Food Insecurity (10/02/2021)   Hunger Vital Sign    Worried About Running Out of Food in the Last Year: Never true    Ran Out of Food in the Last Year: Never true  Transportation Needs: No Transportation Needs (10/02/2021)   PRAPARE - Hydrologist (Medical): No    Lack of Transportation (Non-Medical): No  Physical Activity: Sufficiently Active (10/02/2021)   Exercise Vital Sign    Days of Exercise per Week: 5 days    Minutes of Exercise per Session: 40 min  Stress: No Stress Concern Present (10/02/2021)   [place]    Feeling of  Stress : Not at all  Social Connections: Socially Integrated (10/02/2021)   Social Connection and Isolation Panel [NHANES]    Frequency of Communication with Friends and Family: Not on file    Frequency of Social Gatherings with Friends and Family: More than three times a week    Attends Religious Services: More than 4 times per year    Active Member of [employer] or Organizations: Yes    Attends Music therapist: More than 4 times per year    Marital Status: Married  Human resources officer Violence: Not At Risk (10/02/2021)   Humiliation, Afraid, Rape, and Kick questionnaire    Fear of Current or Ex-Partner: No    Emotionally Abused: No    Physically Abused: No    Sexually Abused: No   Current Meds  Medication Sig   acetaminophen (TYLENOL) 500 MG tablet Take 500 mg by mouth every 6 (six) hours as needed.    fexofenadine (ALLEGRA) 180 MG tablet Take 1 tablet (180 mg total) by mouth daily.   finasteride (PROSCAR) 5 MG tablet Take 1 tablet (5 mg total) by mouth daily.   fluticasone (FLONASE) 50 MCG/ACT nasal spray Place 2 sprays into both nostrils daily.   meloxicam (MOBIC) 15 MG tablet Take 1 tablet (15 mg total) by mouth daily as needed for pain. Take with food.   rosuvastatin (CRESTOR) 10 MG tablet Take 1 tablet (10 mg total) by mouth daily.   traMADol (ULTRAM) 50 MG tablet Take 1 tablet (50 mg total) by mouth every 12 (twelve) hours as needed for up to 5 days.   Allergies  Allergen Reactions   Covid-19 Mrna Vaccine [employer]) [phone] Mrna Vacc (Moderna)]     Patient described lip swelling, whelps on his arm and facial swelling that was fairly extensive. Denied any breathing issues.    No results found for this or any previous visit (from the past 2160 hour(s)). Objective  Body mass index is 21.71 kg/m. Wt Readings from Last 3 Encounters:  06/26/22 147 lb (66.7 kg)  02/12/22 150 lb 12.8 oz (68.4 kg)  01/10/22 142 lb (64.4 kg)   Temp Readings from Last 3 Encounters:  06/26/22 98.4 F (36.9 C) (Oral)  02/12/22 98.3 F (36.8 C) (Oral)  12/31/21 98 F (36.7 C) (Oral)   BP Readings from Last 3 Encounters:  06/26/22 128/76  02/12/22 (!) 128/94  12/31/21 110/78   Pulse Readings from Last 3 Encounters:  06/26/22 81  02/12/22 78  12/31/21 65    Physical Exam Vitals and nursing note reviewed.  Constitutional:      Appearance: Normal appearance. He is well-developed and well-groomed.  HENT:     Head: Normocephalic and atraumatic.  Eyes:     Conjunctiva/sclera: Conjunctivae normal.     Pupils: Pupils are equal, round, and reactive to light.  Cardiovascular:     Rate and Rhythm: Normal rate and regular rhythm.     Heart sounds: Normal heart sounds.  Pulmonary:     Effort: Pulmonary effort is normal. No respiratory distress.     Breath sounds: Normal breath sounds.   Abdominal:     Tenderness: There is no abdominal tenderness.  Skin:    General: Skin is warm and moist.  Neurological:     General: No focal deficit present.     Mental Status: He is alert and oriented to person, place, and time. Mental status is at baseline.     Sensory: Sensation is intact.     Motor: Motor function  is intact.     Coordination: Coordination is intact.     Gait: Gait is intact. Gait normal.  Psychiatric:        Attention and Perception: Attention and perception normal.        Mood and Affect: Mood and affect normal.        Speech: Speech normal.        Behavior: Behavior normal. Behavior is cooperative.        Thought Content: Thought content normal.        Cognition and Memory: Cognition and memory normal.        Judgment: Judgment normal.     Assessment  Plan  Right thigh pain ? MSK I.e piriformis syndrome vs other - Plan: DG Hip Unilat W OR W/O Pelvis 2-3 Views Right, DG FEMUR, MIN 2 VIEWS RIGHT, Ambulatory referral to Orthopedic Surgery Dr. Harlow Mares, traMADol (ULTRAM) 50 MG tablet bid prn Max dose advil 2400 mg/day  Or  Max dose mobic/meloxicam '15mg'$ /day    Aspercream with lidocaine or voltaren gel 4x per day   Heat/ice   I think you need PT and ortho  Provider: Dr. Olivia Mackie McLean-Scocuzza-Internal Medicine

## 2022-06-26 NOTE — Patient Instructions (Addendum)
Max dose advil 2400 mg/day  Or  Max dose mobic/meloxicam '15mg'$ /day    Aspercream with lidocaine or voltaren gel 4x per day   Heat/ice   I think you need PT and ortho   [facility] You may need Physical therapy there as well  Dr. Crist Fat: Elyn Aquas. Harlow Mares, MD 5.0 119 Google reviews Doctor, general practice in [place], [place] online care: [email or web address] Address: [address], [place], [place] [address] Hours:  Open ? Closes 5?PM Phone: [phone]

## 2022-06-29 IMAGING — CR DG CHEST 2V
1 series · 2 of 2 positions shown · non-contrast
Comparison: None.

CLINICAL DATA: 70-year-old male with cough

EXAM:
CHEST - 2 VIEW

[Series 1: dg chest 2 view · 0.14mm/px · 2 of 2 slices shown]
[im 1/2]
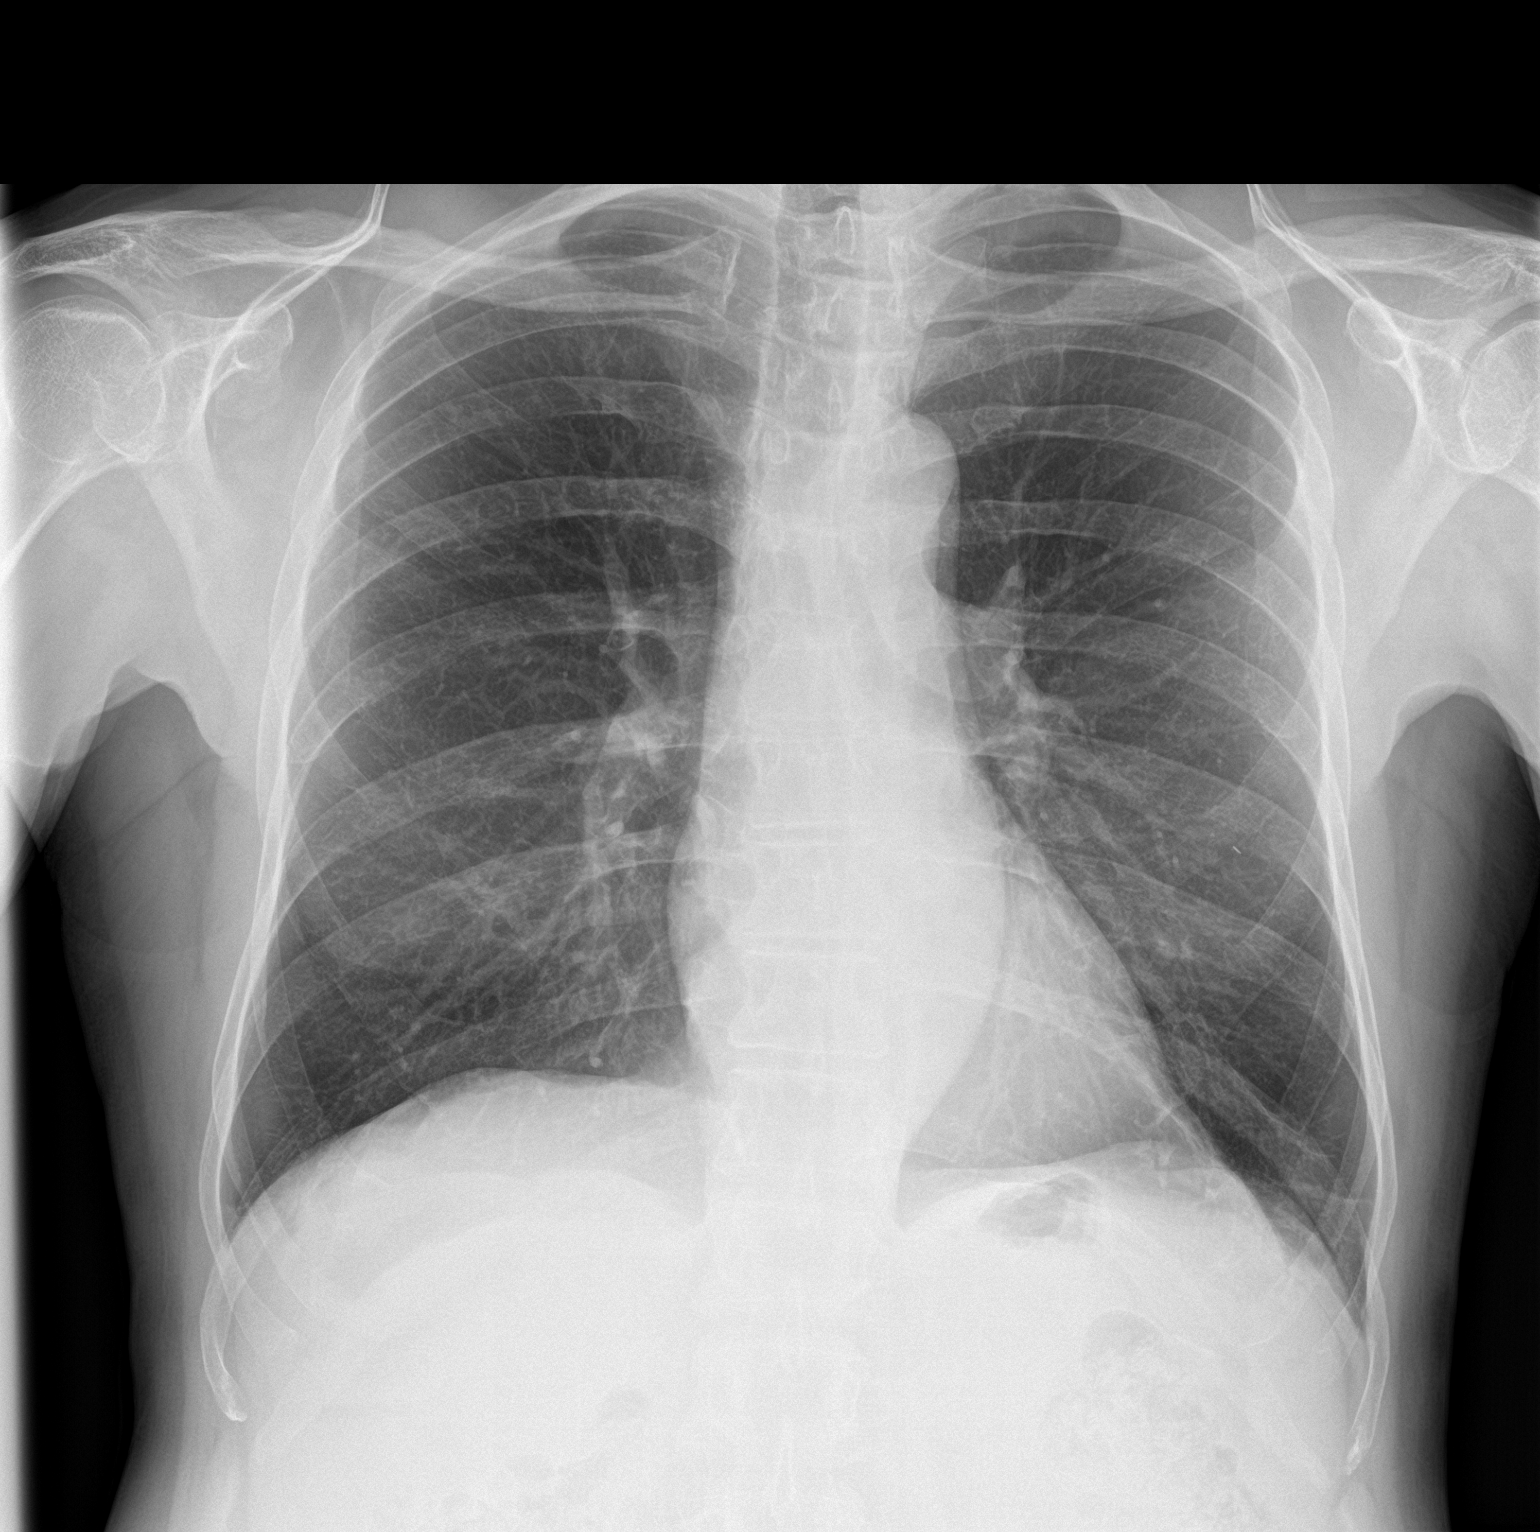
[im 2/2]
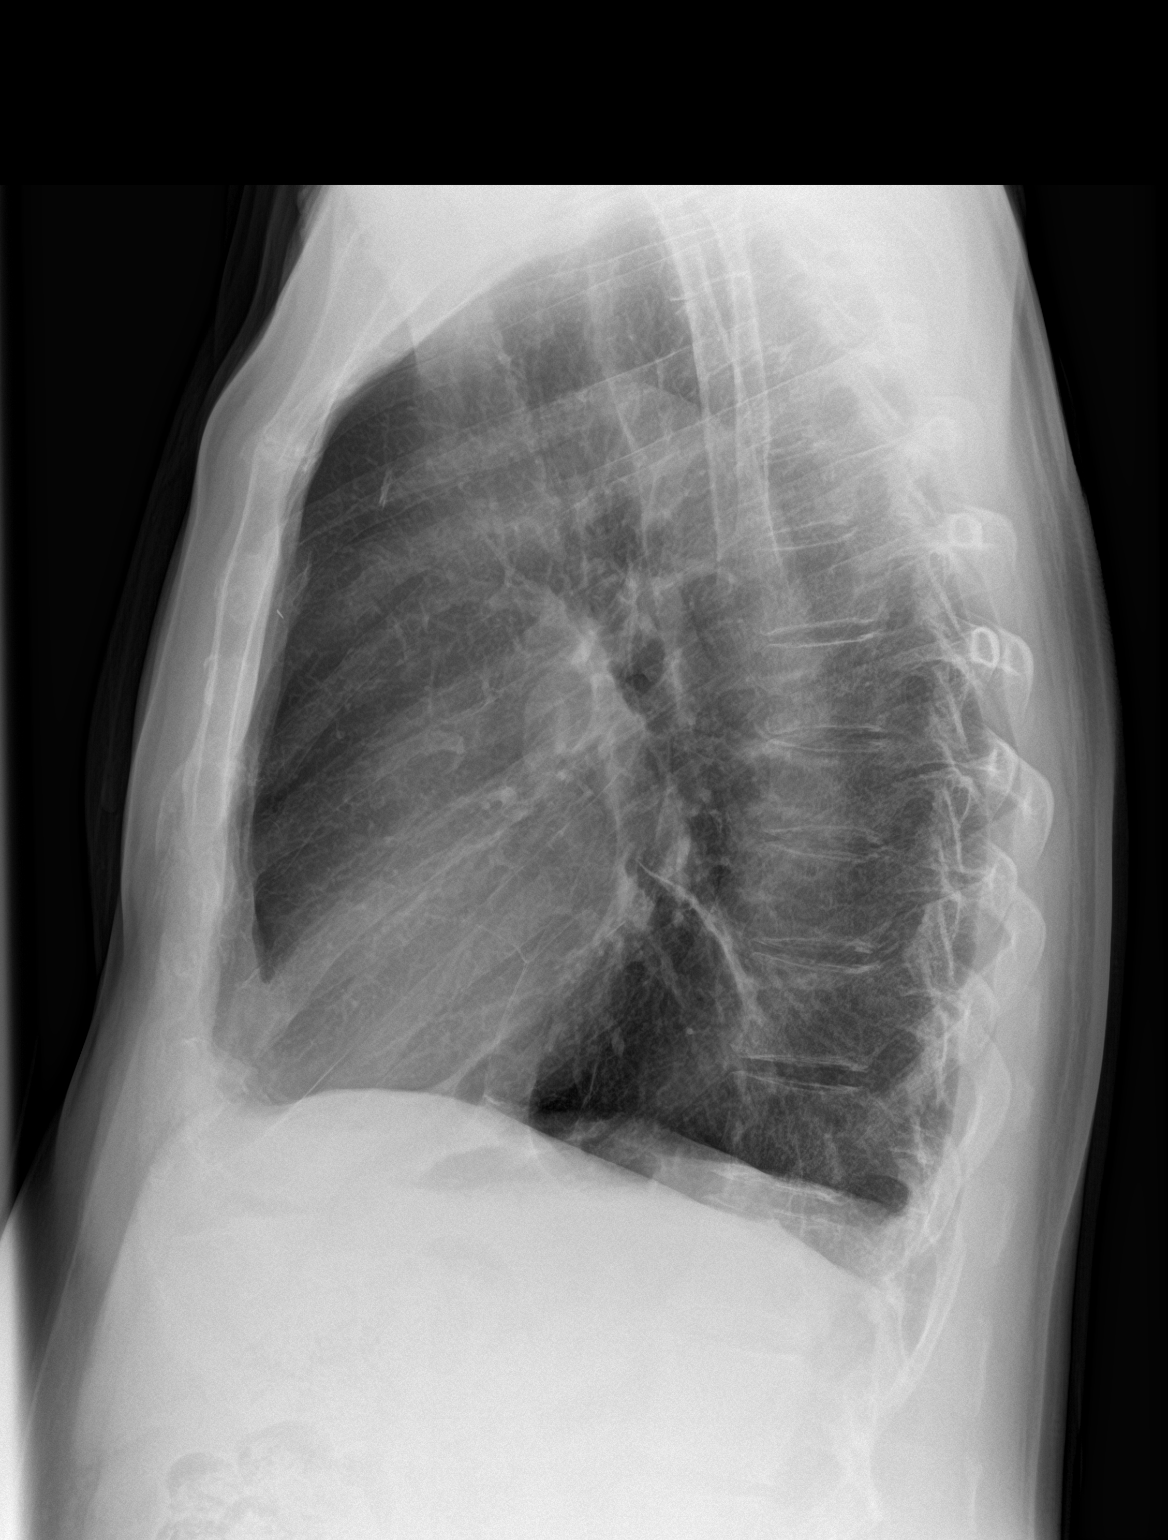

[2 of 2 positions shown; findings below may reference images not displayed]

FINDINGS: Cardiomediastinal silhouette within normal limits in size and
contour. No evidence of central vascular congestion. No interlobular
septal thickening.

No pneumothorax or pleural effusion. Coarsened interstitial
markings, with no confluent airspace disease.

No acute displaced fracture. Degenerative changes of the spine.
IMPRESSION: No active cardiopulmonary disease.

## 2022-07-07 DIAGNOSIS — M7061 Trochanteric bursitis, right hip: Secondary | ICD-10-CM | POA: Diagnosis not present

## 2022-07-14 DIAGNOSIS — H6123 Impacted cerumen, bilateral: Secondary | ICD-10-CM | POA: Diagnosis not present

## 2022-07-14 DIAGNOSIS — H902 Conductive hearing loss, unspecified: Secondary | ICD-10-CM | POA: Diagnosis not present

## 2022-07-14 DIAGNOSIS — J309 Allergic rhinitis, unspecified: Secondary | ICD-10-CM | POA: Diagnosis not present

## 2022-07-14 DIAGNOSIS — M47812 Spondylosis without myelopathy or radiculopathy, cervical region: Secondary | ICD-10-CM | POA: Diagnosis not present

## 2022-07-31 ENCOUNTER — Other Ambulatory Visit: Payer: Self-pay | Admitting: Internal Medicine

## 2022-08-12 ENCOUNTER — Other Ambulatory Visit: Payer: Medicare Other

## 2022-08-15 ENCOUNTER — Ambulatory Visit: Payer: Medicare Other | Admitting: Internal Medicine

## 2022-09-05 DIAGNOSIS — Z23 Encounter for immunization: Secondary | ICD-10-CM | POA: Diagnosis not present

## 2022-10-08 ENCOUNTER — Ambulatory Visit (INDEPENDENT_AMBULATORY_CARE_PROVIDER_SITE_OTHER): Payer: Medicare Other

## 2022-10-08 VITALS — Ht 69.0 in | Wt 146.0 lb

## 2022-10-08 DIAGNOSIS — Z0000 Encounter for general adult medical examination without abnormal findings: Secondary | ICD-10-CM | POA: Diagnosis not present

## 2022-10-08 NOTE — Progress Notes (Addendum)
Subjective:   Johnny Munoz is a 71 y.o. male who presents for Medicare Annual/Subsequent preventive examination.  Review of Systems    No ROS.  Medicare Wellness Virtual Visit.  Visual/audio telehealth visit, UTA vital signs.   See social history for additional risk factors.   Cardiac Risk Factors include: advanced age (>87mn, >>33women)     Objective:    Today's Vitals   10/08/22 1124  Weight: 146 lb (66.2 kg)  Height: '5\' 9"'$  (1.753 m)   Body mass index is 21.56 kg/m.     10/08/2022   11:27 AM 10/02/2021   11:22 AM 10/01/2020   11:28 AM 09/22/2019    9:15 AM 09/17/2018    3:36 PM 07/31/2018    6:57 AM  Advanced Directives  Does Patient Have a Medical Advance Directive? Yes Yes Yes Yes Yes Yes  Type of AParamedicof ABlairLiving will HAshbyLiving will HRiver BluffLiving will HGranitevilleLiving will HBallyLiving will HSuissevale Does patient want to make changes to medical advance directive? No - Patient declined No - Patient declined No - Patient declined No - Patient declined No - Patient declined No - Patient declined  Copy of HCooksvillein Chart? Yes - validated most recent copy scanned in chart (See row information) Yes - validated most recent copy scanned in chart (See row information) Yes - validated most recent copy scanned in chart (See row information) No - copy requested No - copy requested No - copy requested  Would patient like information on creating a medical advance directive?      No - Patient declined    Current Medications (verified) Outpatient Encounter Medications as of 10/08/2022  Medication Sig   acetaminophen (TYLENOL) 500 MG tablet Take 500 mg by mouth every 6 (six) hours as needed.   fexofenadine (ALLEGRA) 180 MG tablet TAKE ONE TABLET BY MOUTH DAILY   finasteride (PROSCAR) 5 MG tablet Take 1 tablet (5  mg total) by mouth daily.   fluticasone (FLONASE) 50 MCG/ACT nasal spray Place 2 sprays into both nostrils daily.   meloxicam (MOBIC) 15 MG tablet Take 1 tablet (15 mg total) by mouth daily as needed for pain. Take with food.   rosuvastatin (CRESTOR) 10 MG tablet Take 1 tablet (10 mg total) by mouth daily.   No facility-administered encounter medications on file as of 10/08/2022.    Allergies (verified) Covid-19 mrna vaccine (pfizer) [Debbe Odeamrna vacc (moderna)]   History: Past Medical History:  Diagnosis Date   Allergy    Arthritis    BPH (benign prostatic hyperplasia)    Hyperlipidemia    Past Surgical History:  Procedure Laterality Date   cataract Right 02/03/2022   COLONOSCOPY  2003, 10/12/13   Dr. KNicolasa Ducking Dr BHermine MessickUrethral Microwave Therapy     Family History  Problem Relation Age of Onset   Diabetes Father    Heart disease Father        CABG after 665  Prostate cancer Maternal Uncle    Kidney cancer Neg Hx    Bladder Cancer Neg Hx    Social History   Socioeconomic History   Marital status: Married    Spouse name: Not on file   Number of children: Not on file   Years of education: 16   Highest education level: Not on file  Occupational History   Occupation: CTherapist, art  Employer: [employer]   Occupation: Retired  Tobacco Use   Smoking status: Never   Smokeless tobacco: Never  Vaping Use   Vaping Use: Never used  Substance and Sexual Activity   Alcohol use: Yes    Alcohol/week: 3.0 standard drinks of alcohol    Types: 3 drink(s) per week   Drug use: No   Sexual activity: Not on file  Other Topics Concern   Not on file  Social History Narrative   Regular exercise-yes   Caffeine Use-yes         Social Determinants of Health   Financial Resource Strain: Low Risk  (10/08/2022)   Overall Financial Resource Strain (CARDIA)    Difficulty of Paying Living Expenses: Not hard at all  Food Insecurity: Unknown (10/08/2022)    Hunger Vital Sign    Worried About Running Out of Food in the Last Year: Patient refused    [place] in the Last Year: Patient refused  Transportation Needs: No Transportation Needs (10/08/2022)   PRAPARE - Hydrologist (Medical): No    Lack of Transportation (Non-Medical): No  Physical Activity: Insufficiently Active (10/08/2022)   Exercise Vital Sign    Days of Exercise per Week: 5 days    Minutes of Exercise per Session: 20 min  Stress: No Stress Concern Present (10/08/2022)   [place]    Feeling of Stress : Only a little  Social Connections: Unknown (10/08/2022)   Social Connection and Isolation Panel [NHANES]    Frequency of Communication with Friends and Family: More than three times a week    Frequency of Social Gatherings with Friends and Family: Twice a week    Attends Religious Services: Not on Advertising copywriter or Organizations: Yes    Attends Music therapist: More than 4 times per year    Marital Status: Married    Tobacco Counseling Counseling given: Not Answered   Clinical Intake:  Pre-visit preparation completed: Yes        Diabetes: No  How often do you need to have someone help you when you read instructions, pamphlets, or other written materials from your doctor or pharmacy?: 1 - Never    Interpreter Needed?: No      Activities of Daily Living    10/08/2022   11:26 AM 10/05/2022    5:44 PM  In your present state of health, do you have any difficulty performing the following activities:  Hearing? 0 0  Vision? 0 0  Difficulty concentrating or making decisions? 0 0  Walking or climbing stairs? 0 0  Dressing or bathing? 0 0  Doing errands, shopping? 0 0  Preparing Food and eating ? N N  Using the Toilet? N N  In the past six months, have you accidently leaked urine? N N  Do you have problems with loss of bowel control?  N N  Managing your Medications? N N  Managing your Finances? N N  Housekeeping or managing your Housekeeping? N N    Patient Care Team: [name], MD as PCP - General (Internal Medicine) [name], Forest Gleason, MD (General Surgery) [name], MD (Internal Medicine) Christene Lye, MD (General Surgery) [name], MD (Internal Medicine)  Indicate any recent Medical Services you may have received from other than [facility] providers in the past year (date may be approximate).  Assessment:   This is a routine wellness examination for Erven.  Hearing/Vision screen Hearing Screening - Comments:: Patient is able to hear conversational tones without difficulty.  No issues reported.   Vision Screening - Comments:: Followed by [facility]  Wears corrective lenses  Annual exams Cataract extracted, bilateral.     Dietary issues and exercise activities discussed: Current Exercise Habits: Home exercise routine, Type of exercise: walking, Intensity: Mild   Goals Addressed             This Visit's Progress    Maintain Healthy Lifestyle   On track    Monitor sugar intake       Depression Screen    10/08/2022   11:26 AM 06/26/2022    2:07 PM 02/12/2022    3:27 PM 12/31/2021    8:32 AM 10/16/2021    8:37 AM 10/02/2021   11:21 AM 10/01/2020   11:07 AM  PHQ 2/9 Scores  PHQ - 2 Score 0 0 0 0 0 0 0    Fall Risk    10/08/2022   11:27 AM 10/05/2022    5:44 PM 06/26/2022    2:07 PM 02/12/2022    3:27 PM 12/31/2021    8:32 AM  Fall Risk   Falls in the past year? 0 0 1 0 0  Number falls in past yr:  0 1  0  Injury with Fall?  0 0  0  Risk for fall due to :    No Fall Risks No Fall Risks  Follow up Falls evaluation completed   Falls evaluation completed Falls evaluation completed    [place]: Home free of loose throw rugs in walkways, pet beds, electrical cords, etc? Yes  Adequate lighting in your home to reduce risk of  falls? Yes   ASSISTIVE DEVICES UTILIZED TO PREVENT FALLS: Life alert? No  Use of a cane, walker or w/c? No   TIMED UP AND GO: Was the test performed? No .   Cognitive Function:    09/17/2018    3:32 PM  MMSE - Mini Mental State Exam  Orientation to time 5  Orientation to Place 5  Registration 3  Attention/ Calculation 5  Recall 3  Language- name 2 objects 2  Language- repeat 1  Language- follow 3 step command 3  Language- read & follow direction 1  Write a sentence 1  Copy design 1  Total score 30        10/08/2022   11:48 AM 09/22/2019    9:15 AM  6CIT Screen  What Year?  0 points  What month?  0 points  What time?  0 points  Count back from 20  0 points  Months in reverse 0 points 0 points  Repeat phrase  0 points  Total Score  0 points    Immunizations Immunization History  Administered Date(s) Administered   Fluad Quad(high Dose 65+) 08/27/2019, 09/30/2021, 09/14/2022   Influenza, High Dose Seasonal PF 08/25/2017, 09/07/2020   Influenza,inj,Quad PF,6+ Mos 08/11/2014, 09/14/2015, 09/17/2018   Influenza-Unspecified 08/31/2012   PFIZER(Purple Top)SARS-COV-2 Vaccination 01/13/2020, 02/07/2020, 08/31/2020   PNEUMOCOCCAL CONJUGATE-20 04/11/2021   Pfizer Covid-19 Vaccine Bivalent Booster 65yr & up 08/31/2021   Pneumococcal Conjugate-13 08/25/2017   Td 12/30/2007   Tdap 07/31/2018   Zoster, Live 10/10/2010   Screening Tests Health Maintenance  Topic Date Due   COVID-19 Vaccine (5 - PLaFayetteseries) 10/24/2022 (Originally 01/01/2022)   Medicare Annual Wellness (AWV)  10/09/2023   COLONOSCOPY (Pts 45-67yr Insurance coverage will need to be confirmed)  10/13/2023   TETANUS/TDAP  07/31/2028   Pneumonia Vaccine 71 Years old  Completed   INFLUENZA VACCINE  Completed   Hepatitis C Screening  Completed   HPV VACCINES  Aged Out   Zoster Vaccines- Shingrix  Discontinued   Health Maintenance There are no preventive care reminders to display for this  patient.  Lung Cancer Screening: (Low Dose CT Chest recommended if Age 71-80years, 30 pack-year currently smoking OR have quit w/in 15years.) does not qualify.   Hepatitis C Screening: Completed 2017.  Vision Screening: Recommended annual ophthalmology exams for early detection of glaucoma and other disorders of the eye.  Dental Screening: Recommended annual dental exams for proper oral hygiene  Community Resource Referral / Chronic Care Management: CRR required this visit?  No   CCM required this visit?  No      Plan:     I have personally reviewed and noted the following in the patient's chart:   Medical and social history Use of alcohol, tobacco or illicit drugs  Current medications and supplements including opioid prescriptions. Patient is not currently taking opioid prescriptions. Functional ability and status Nutritional status Physical activity Advanced directives List of other physicians Hospitalizations, surgeries, and ER visits in previous 12 months Vitals Screenings to include cognitive, depression, and falls Referrals and appointments  In addition, I have reviewed and discussed with patient certain preventive protocols, quality metrics, and best practice recommendations. A written personalized care plan for preventive services as well as general preventive health recommendations were provided to patient.     DMadill LPN   125/05/3892    I have reviewed the above information and agree with above.   TDeborra Medina MD

## 2022-10-08 NOTE — Patient Instructions (Addendum)
Johnny Munoz , Thank you for taking time to come for your Medicare Wellness Visit. I appreciate your ongoing commitment to your health goals. Please review the following plan we discussed and let me know if I can assist you in the future.   These are the goals we discussed:  Goals      Maintain Healthy Lifestyle     Monitor sugar intake        This is a list of the screening recommended for you and due dates:  Health Maintenance  Topic Date Due   COVID-19 Vaccine (5 - Pfizer series) 10/24/2022*   Medicare Annual Wellness Visit  10/09/2023   Colon Cancer Screening  10/13/2023   Tetanus Vaccine  07/31/2028   Pneumonia Vaccine  Completed   Flu Shot  Completed   Hepatitis C Screening: USPSTF Recommendation to screen - Ages 18-79 yo.  Completed   HPV Vaccine  Aged Out   Zoster (Shingles) Vaccine  Discontinued  *Topic was postponed. The date shown is not the original due date.    Advanced directives: on file  Conditions/risks identified: none new  Next appointment: Follow up in one year for your annual wellness visit.   Preventive Care 71 Years and Older, Male  Preventive care refers to lifestyle choices and visits with your health care provider that can promote health and wellness. What does preventive care include? A yearly physical exam. This is also called an annual well check. Dental exams once or twice a year. Routine eye exams. Ask your health care provider how often you should have your eyes checked. Personal lifestyle choices, including: Daily care of your teeth and gums. Regular physical activity. Eating a healthy diet. Avoiding tobacco and drug use. Limiting alcohol use. Practicing safe sex. Taking low doses of aspirin every day. Taking vitamin and mineral supplements as recommended by your health care provider. What happens during an annual well check? The services and screenings done by your health care provider during your annual well check will depend on  your age, overall health, lifestyle risk factors, and family history of disease. Counseling  Your health care provider may ask you questions about your: Alcohol use. Tobacco use. Drug use. Emotional well-being. Home and relationship well-being. Sexual activity. Eating habits. History of falls. Memory and ability to understand (cognition). Work and work Statistician. Screening  You may have the following tests or measurements: Height, weight, and BMI. Blood pressure. Lipid and cholesterol levels. These may be checked every 5 years, or more frequently if you are over 19 years old. Skin check. Lung cancer screening. You may have this screening every year starting at age 16 if you have a 30-pack-year history of smoking and currently smoke or have quit within the past 15 years. Fecal occult blood test (FOBT) of the stool. You may have this test every year starting at age 85. Flexible sigmoidoscopy or colonoscopy. You may have a sigmoidoscopy every 5 years or a colonoscopy every 10 years starting at age 45. Prostate cancer screening. Recommendations will vary depending on your family history and other risks. Hepatitis C blood test. Hepatitis B blood test. Sexually transmitted disease (STD) testing. Diabetes screening. This is done by checking your blood sugar (glucose) after you have not eaten for a while (fasting). You may have this done every 1-3 years. Abdominal aortic aneurysm (AAA) screening. You may need this if you are a current or former smoker. Osteoporosis. You may be screened starting at age 64 if you are at high risk.  Talk with your health care provider about your test results, treatment options, and if necessary, the need for more tests. Vaccines  Your health care provider may recommend certain vaccines, such as: Influenza vaccine. This is recommended every year. Tetanus, diphtheria, and acellular pertussis (Tdap, Td) vaccine. You may need a Td booster every 10 years. Zoster  vaccine. You may need this after age 34. Pneumococcal 13-valent conjugate (PCV13) vaccine. One dose is recommended after age 69. Pneumococcal polysaccharide (PPSV23) vaccine. One dose is recommended after age 61. Talk to your health care provider about which screenings and vaccines you need and how often you need them. This information is not intended to replace advice given to you by your health care provider. Make sure you discuss any questions you have with your health care provider. Document Released: 12/14/2015 Document Revised: 08/06/2016 Document Reviewed: 09/18/2015 Elsevier Interactive Patient Education  2017 [place] Prevention in the Home Falls can cause injuries. They can happen to people of all ages. There are many things you can do to make your home safe and to help prevent falls. What can I do on the outside of my home? Regularly fix the edges of walkways and driveways and fix any cracks. Remove anything that might make you trip as you walk through a door, such as a raised step or threshold. Trim any bushes or trees on the path to your home. Use bright outdoor lighting. Clear any walking paths of anything that might make someone trip, such as rocks or tools. Regularly check to see if handrails are loose or broken. Make sure that both sides of any steps have handrails. Any raised decks and porches should have guardrails on the edges. Have any leaves, snow, or ice cleared regularly. Use sand or salt on walking paths during winter. Clean up any spills in your garage right away. This includes oil or grease spills. What can I do in the bathroom? Use night lights. Install grab bars by the toilet and in the tub and shower. Do not use towel bars as grab bars. Use non-skid mats or decals in the tub or shower. If you need to sit down in the shower, use a plastic, non-slip stool. Keep the floor dry. Clean up any water that spills on the floor as soon as it happens. Remove  soap buildup in the tub or shower regularly. Attach bath mats securely with double-sided non-slip rug tape. Do not have throw rugs and other things on the floor that can make you trip. What can I do in the bedroom? Use night lights. Make sure that you have a light by your bed that is easy to reach. Do not use any sheets or blankets that are too big for your bed. They should not hang down onto the floor. Have a firm chair that has side arms. You can use this for support while you get dressed. Do not have throw rugs and other things on the floor that can make you trip. What can I do in the kitchen? Clean up any spills right away. Avoid walking on wet floors. Keep items that you use a lot in easy-to-reach places. If you need to reach something above you, use a strong step stool that has a grab bar. Keep electrical cords out of the way. Do not use floor polish or wax that makes floors slippery. If you must use wax, use non-skid floor wax. Do not have throw rugs and other things on the floor that can make  you trip. What can I do with my stairs? Do not leave any items on the stairs. Make sure that there are handrails on both sides of the stairs and use them. Fix handrails that are broken or loose. Make sure that handrails are as long as the stairways. Check any carpeting to make sure that it is firmly attached to the stairs. Fix any carpet that is loose or worn. Avoid having throw rugs at the top or bottom of the stairs. If you do have throw rugs, attach them to the floor with carpet tape. Make sure that you have a light switch at the top of the stairs and the bottom of the stairs. If you do not have them, ask someone to add them for you. What else can I do to help prevent falls? Wear shoes that: Do not have high heels. Have rubber bottoms. Are comfortable and fit you well. Are closed at the toe. Do not wear sandals. If you use a stepladder: Make sure that it is fully opened. Do not climb a  closed stepladder. Make sure that both sides of the stepladder are locked into place. Ask someone to hold it for you, if possible. Clearly mark and make sure that you can see: Any grab bars or handrails. First and last steps. Where the edge of each step is. Use tools that help you move around (mobility aids) if they are needed. These include: Canes. Walkers. Scooters. Crutches. Turn on the lights when you go into a dark area. Replace any light bulbs as soon as they burn out. Set up your furniture so you have a clear path. Avoid moving your furniture around. If any of your floors are uneven, fix them. If there are any pets around you, be aware of where they are. Review your medicines with your doctor. Some medicines can make you feel dizzy. This can increase your chance of falling. Ask your doctor what other things that you can do to help prevent falls. This information is not intended to replace advice given to you by your health care provider. Make sure you discuss any questions you have with your health care provider. Document Released: 09/13/2009 Document Revised: 04/24/2016 Document Reviewed: 12/22/2014 Elsevier Interactive Patient Education  2017 [employer].

## 2022-10-14 ENCOUNTER — Other Ambulatory Visit (INDEPENDENT_AMBULATORY_CARE_PROVIDER_SITE_OTHER): Payer: Medicare Other

## 2022-10-14 DIAGNOSIS — L509 Urticaria, unspecified: Secondary | ICD-10-CM | POA: Diagnosis not present

## 2022-10-14 DIAGNOSIS — E785 Hyperlipidemia, unspecified: Secondary | ICD-10-CM | POA: Diagnosis not present

## 2022-10-14 DIAGNOSIS — R7303 Prediabetes: Secondary | ICD-10-CM | POA: Diagnosis not present

## 2022-10-14 LAB — CBC WITH DIFFERENTIAL/PLATELET
Basophils Absolute: 0.1 10*3/uL (ref 0.0–0.1)
Basophils Relative: 1 % (ref 0.0–3.0)
Eosinophils Absolute: 0.4 10*3/uL (ref 0.0–0.7)
Eosinophils Relative: 7.4 % — ABNORMAL HIGH (ref 0.0–5.0)
HCT: 46.3 % (ref 39.0–52.0)
Hemoglobin: 15.3 g/dL (ref 13.0–17.0)
Lymphocytes Relative: 34.1 % (ref 12.0–46.0)
Lymphs Abs: 2 10*3/uL (ref 0.7–4.0)
MCHC: 33 g/dL (ref 30.0–36.0)
MCV: 93.9 fl (ref 78.0–100.0)
Monocytes Absolute: 0.4 10*3/uL (ref 0.1–1.0)
Monocytes Relative: 7.6 % (ref 3.0–12.0)
Neutro Abs: 2.9 10*3/uL (ref 1.4–7.7)
Neutrophils Relative %: 49.9 % (ref 43.0–77.0)
Platelets: 259 10*3/uL (ref 150.0–400.0)
RBC: 4.93 Mil/uL (ref 4.22–5.81)
RDW: 14.1 % (ref 11.5–15.5)
WBC: 5.8 10*3/uL (ref 4.0–10.5)

## 2022-10-14 LAB — LIPID PANEL
Cholesterol: 163 mg/dL (ref 0–200)
HDL: 73.5 mg/dL (ref >39.00)
LDL Cholesterol: 77 mg/dL (ref 0–99)
NonHDL: 89.88
Total CHOL/HDL Ratio: 2
Triglycerides: 64 mg/dL (ref 0.0–149.0)
VLDL: 12.8 mg/dL (ref 0.0–40.0)

## 2022-10-14 LAB — COMPREHENSIVE METABOLIC PANEL
ALT: 25 U/L (ref 0–53)
AST: 21 U/L (ref 0–37)
Albumin: 4.4 g/dL (ref 3.5–5.2)
Alkaline Phosphatase: 53 U/L (ref 39–117)
BUN: 22 mg/dL (ref 6–23)
CO2: 29 mEq/L (ref 19–32)
Calcium: 9.1 mg/dL (ref 8.4–10.5)
Chloride: 103 mEq/L (ref 96–112)
Creatinine, Ser: 1.03 mg/dL (ref 0.40–1.50)
GFR: 73.32 mL/min (ref >60.00)
Glucose, Bld: 98 mg/dL (ref 70–99)
Potassium: 4.5 mEq/L (ref 3.5–5.1)
Sodium: 139 mEq/L (ref 135–145)
Total Bilirubin: 0.5 mg/dL (ref 0.2–1.2)
Total Protein: 6.6 g/dL (ref 6.0–8.3)

## 2022-10-14 LAB — HEMOGLOBIN A1C: Hgb A1c MFr Bld: 6.1 % (ref 4.6–6.5)

## 2022-10-17 ENCOUNTER — Ambulatory Visit (INDEPENDENT_AMBULATORY_CARE_PROVIDER_SITE_OTHER): Payer: Medicare Other | Admitting: Internal Medicine

## 2022-10-17 ENCOUNTER — Encounter: Payer: Self-pay | Admitting: Internal Medicine

## 2022-10-17 VITALS — BP 120/72 | HR 66 | Temp 98.0°F | Ht 69.0 in | Wt 150.6 lb

## 2022-10-17 DIAGNOSIS — E785 Hyperlipidemia, unspecified: Secondary | ICD-10-CM

## 2022-10-17 DIAGNOSIS — R3911 Hesitancy of micturition: Secondary | ICD-10-CM

## 2022-10-17 DIAGNOSIS — Z87898 Personal history of other specified conditions: Secondary | ICD-10-CM | POA: Diagnosis not present

## 2022-10-17 DIAGNOSIS — Z23 Encounter for immunization: Secondary | ICD-10-CM

## 2022-10-17 DIAGNOSIS — N401 Enlarged prostate with lower urinary tract symptoms: Secondary | ICD-10-CM | POA: Diagnosis not present

## 2022-10-17 MED ORDER — OMEPRAZOLE 40 MG PO CPDR: 40.0000 mg | DELAYED_RELEASE_CAPSULE | Freq: Every day | ORAL | 3 refills | Status: DC

## 2022-10-17 NOTE — Patient Instructions (Addendum)
For your joint pain :   You can take up to 2000 mg of acetominophen (tylenol) every day safely  In divided doses (500 mg every 6 hours  Or 1000 mg every 12 hours.)   Do this daily,  add meloxicam,  along  with a dose of  omeprazole (to prevent gastritis), for pain not relieved with tylenol   Osteobiflex type products may help and are harmless glucosamine supplements that can be combined with your other medications

## 2022-10-17 NOTE — Progress Notes (Unsigned)
Patient ID: Johnny Munoz, male    DOB: 1951-07-18  Age: 71 y.o. MRN: [id number]  The patient is here for follow up and management of other chronic and acute problems.   The risk factors are reflected in the social history.  The roster of all physicians providing medical care to patient - is listed in the Snapshot section of the chart.  Activities of daily living:  The patient is 100% independent in all ADLs: dressing, toileting, feeding as well as independent mobility  Home safety : The patient has smoke detectors in the home. They wear seatbelts.  There are no firearms at home. There is no violence in the home.   There is no risks for hepatitis, STDs or HIV. There is no   history of blood transfusion. They have no travel history to infectious disease endemic areas of the world.  The patient has seen their dentist in the last six month. They have seen their eye doctor in the last year. They admit to slight hearing difficulty with regard to whispered voices and some television programs.  They have deferred audiologic testing in the last year.  They do not  have excessive sun exposure. Discussed the need for sun protection: hats, long sleeves and use of sunscreen if there is significant sun exposure.   Diet: the importance of a healthy diet is discussed. They do have a healthy diet.  The benefits of regular aerobic exercise were discussed. She walks 4 times per week ,  20 minutes.   Depression screen: there are no signs or vegative symptoms of depression- irritability, change in appetite, anhedonia, sadness/tearfullness.  Cognitive assessment: the patient manages all their financial and personal affairs and is actively engaged. They could relate day,date,year and events; recalled 2/3 objects at 3 minutes; performed clock-face test normally.  The following portions of the patient's history were reviewed and updated as appropriate: allergies, current medications, past family history, past  medical history,  past surgical history, past social history  and problem list.  Visual acuity was not assessed per patient preference since she has regular follow up with her ophthalmologist. Hearing and body mass index were assessed and reviewed.   During the course of the visit the patient was educated and counseled about appropriate screening and preventive services including : fall prevention , diabetes screening, nutrition counseling, colorectal cancer screening, and recommended immunizations.    CC: There were no encounter diagnoses.   1) tight hip pai improved with stretching   2) OA:  daily use of meloxicam causing gastritis    [name] has a past medical history of Allergy, Arthritis, BPH (benign prostatic hyperplasia), and Hyperlipidemia.   Johnny has a past surgical history that includes Trans Urethral Microwave Therapy; Colonoscopy (2003, 10/12/13); and cataract (Right, 02/03/2022).   His family history includes Diabetes in his father; Heart disease in his father; Prostate cancer in his maternal MunozHe reports that Johnny has never smoked. Johnny has never used smokeless tobacco. Johnny reports current alcohol use of about 3.0 standard drinks of alcohol per week. Johnny reports that Johnny does not use drugs.  Outpatient Medications Prior to Visit  Medication Sig Dispense Refill   acetaminophen (TYLENOL) 500 MG tablet Take 500 mg by mouth every 6 (six) hours as needed.     fexofenadine (ALLEGRA) 180 MG tablet TAKE ONE TABLET BY MOUTH DAILY 90 tablet 1   finasteride (PROSCAR) 5 MG tablet Take 1 tablet (5 mg total) by mouth daily. 90 tablet 3  fluticasone (FLONASE) 50 MCG/ACT nasal spray Place 2 sprays into both nostrils daily. 16 g 6   meloxicam (MOBIC) 15 MG tablet Take 1 tablet (15 mg total) by mouth daily as needed for pain. Take with food. 30 tablet 0   rosuvastatin (CRESTOR) 10 MG tablet Take 1 tablet (10 mg total) by mouth daily. 90 tablet 3   No facility-administered medications  prior to visit.    Review of Systems  Objective:  BP 120/72 (BP Location: Left Arm, Patient Position: Sitting, Cuff Size: Normal)   Pulse 66   Temp 98 F (36.7 C) (Oral)   Ht '5\' 9"'$  (1.753 m)   Wt 150 lb 9.6 oz (68.3 kg)   SpO2 96%   BMI 22.24 kg/m   Physical Exam  Physical Exam   Assessment & Plan:   Problem List Items Addressed This Visit   None   I am having Dellis Filbert A. Munoz maintain his acetaminophen, fluticasone, meloxicam, rosuvastatin, finasteride, and fexofenadine.    I provided 40 minutes of  face-to-face time during this encounter reviewing patient's current problems and past surgeries,  recent labs and imaging studies, providing counseling on the above mentioned problems , and coordination  of care .   Follow-up: No follow-ups on file.   Johnny Mc, MD

## 2022-10-19 NOTE — Assessment & Plan Note (Signed)
Now followed by Urology with annual pSA's. No change since 2021  Lab Results  Component Value Date   PSA1 1.3 12/17/2021   PSA1 1.2 12/14/2020   PSA1 1.3 12/19/2019   PSA 1.0 08/27/2016   PSA 0.75 03/06/2016   PSA 2.11 06/28/2013

## 2022-10-19 NOTE — Assessment & Plan Note (Signed)
Secondary to BPH, managed with proscar.  Follow up with Urology every 6 months

## 2022-10-19 NOTE — Assessment & Plan Note (Signed)
Managed with Crestor since 2018  due to elevation in risk to 16% using the FRC.    Lab Results  Component Value Date   CHOL 163 10/14/2022   HDL 73.50 10/14/2022   LDLCALC 77 10/14/2022   LDLDIRECT 120.8 12/29/2013   TRIG 64.0 10/14/2022   CHOLHDL 2 10/14/2022   Lab Results  Component Value Date   CREATININE 1.03 10/14/2022

## 2022-10-24 ENCOUNTER — Other Ambulatory Visit: Payer: Self-pay | Admitting: Internal Medicine

## 2022-11-17 DIAGNOSIS — D2262 Melanocytic nevi of left upper limb, including shoulder: Secondary | ICD-10-CM | POA: Diagnosis not present

## 2022-11-17 DIAGNOSIS — Z85828 Personal history of other malignant neoplasm of skin: Secondary | ICD-10-CM | POA: Diagnosis not present

## 2022-11-17 DIAGNOSIS — D2261 Melanocytic nevi of right upper limb, including shoulder: Secondary | ICD-10-CM | POA: Diagnosis not present

## 2022-11-17 DIAGNOSIS — L820 Inflamed seborrheic keratosis: Secondary | ICD-10-CM | POA: Diagnosis not present

## 2022-11-17 DIAGNOSIS — D2272 Melanocytic nevi of left lower limb, including hip: Secondary | ICD-10-CM | POA: Diagnosis not present

## 2022-11-17 DIAGNOSIS — L538 Other specified erythematous conditions: Secondary | ICD-10-CM | POA: Diagnosis not present

## 2022-12-16 ENCOUNTER — Other Ambulatory Visit: Payer: Medicare Other

## 2022-12-16 DIAGNOSIS — N138 Other obstructive and reflux uropathy: Secondary | ICD-10-CM

## 2022-12-16 DIAGNOSIS — N401 Enlarged prostate with lower urinary tract symptoms: Secondary | ICD-10-CM

## 2022-12-17 LAB — PSA: Prostate Specific Ag, Serum: 1.2 ng/mL (ref 0.0–4.0)

## 2022-12-18 NOTE — Progress Notes (Signed)
12/19/2022  8:48 AM   Johnny Munoz 05-22-1951 [id number]  Referring provider: [name], MD [address] [place],  [place] [address]  Urological history 1. Elevated PSA -family history of prostate cancer w/ maternal uncle - Underwent a prostate biopsy in 10/2011 following a PSA of 4.1 that was benign. He was followed by Dr. [name] at [facility] at that time - PSA Trend  Prostate Specific Ag, Serum  Latest Ref Rng 0.0 - 4.0 ng/mL  12/13/2018 0.8   12/19/2019 1.3   12/14/2020 1.2   12/17/2021 1.3   12/16/2022 1.2   Corrected value (2.4) 1.2   2. BPH with LU TS - underwent TUMT with Dr. [name] in 2007 - I PSS 16/4 - PVR 34 mL  - finasteride 5 mg daily  3. ED -contributing factors of age, BPH -SHIM 22  HPI: Johnny Munoz is a 72 y.o.  male who presents today for a yearly follow up.   His urinary symptoms of frequency, urgency, postvoid dribbling and a weak urinary stream have become progressively worse since his last visit with [place].  He is taking finasteride 5 mg daily.  He is now interested in a bladder outlet procedure if appropriate.  Patient denies any modifying or aggravating factors.  Patient denies any gross hematuria, dysuria or suprapubic/flank pain.  Patient denies any fevers, chills, nausea or vomiting.    UA yellow clear, specific gravity 1.020, pH 5.5, trace leukocyte, 6-10 WBCs, 0-2 RBCs, 0-10 epithelial cells, mucus threads are present and moderate bacteria.  PVR 34 mL   I PSS 16/4   IPSS     Row Name 12/19/22 0800         International Prostate Symptom Score   How often have you had the sensation of not emptying your bladder? About half the time     How often have you had to urinate less than every two hours? About half the time     How often have you found you stopped and started again several times when you urinated? About half the time     How often have you found it difficult to postpone urination? Less than  half the time     How often have you had a weak urinary stream? About half the time     How often have you had to strain to start urination? Less than 1 in 5 times     How many times did you typically get up at night to urinate? 1 Time     Total IPSS Score 16       Quality of Life due to urinary symptoms   If you were to spend the rest of your life with your urinary condition just the way it is now how would you feel about that? Mostly Disatisfied                Score:  1-7 Mild 8-19 Moderate 20-35 Severe  SHIM 22  Patient still having spontaneous erections.  He denies any pain or curvature with erections.     SHIM     Row Name 12/19/22 0826         SHIM: Over the last 6 months:   How do you rate your confidence that you could get and keep an erection? High     When you had erections with sexual stimulation, how often were your erections hard enough for penetration (entering your partner)? Most Times (much more than half the  time)     During sexual intercourse, how often were you able to maintain your erection after you had penetrated (entered) your partner? Most Times (much more than half the time)     During sexual intercourse, how difficult was it to maintain your erection to completion of intercourse? Not Difficult     When you attempted sexual intercourse, how often was it satisfactory for you? Almost Always or Always       SHIM Total Score   SHIM 22              Score: 1-7 Severe ED 8-11 Moderate ED 12-16 Mild-Moderate ED 17-21 Mild ED 22-25 No ED    PMH: Past Medical History:  Diagnosis Date   Allergy    Arthritis    BPH (benign prostatic hyperplasia)    Hyperlipidemia     Surgical History: Past Surgical History:  Procedure Laterality Date   cataract Right 02/03/2022   COLONOSCOPY  2003, 10/12/13   Dr. [name], Dr Hermine Messick Urethral Microwave Therapy      Home Medications:  Allergies as of 12/19/2022       Reactions   Covid-19  Mrna Vaccine (pfizer) [covid-19 Mrna Vacc (moderna)]    Patient described lip swelling, whelps on his arm and facial swelling that was fairly extensive. Denied any breathing issues.         Medication List        Accurate as of December 19, 2022  8:48 AM. If you have any questions, ask your nurse or doctor.          STOP taking these medications    omeprazole 40 MG capsule Commonly known as: PRILOSEC Stopped by: Zara Council, PA-C       TAKE these medications    acetaminophen 500 MG tablet Commonly known as: TYLENOL Take 500 mg by mouth every 6 (six) hours as needed.   fexofenadine 180 MG tablet Commonly known as: ALLEGRA TAKE ONE TABLET BY MOUTH DAILY   finasteride 5 MG tablet Commonly known as: PROSCAR Take 1 tablet (5 mg total) by mouth daily.   fluticasone 50 MCG/ACT nasal spray Commonly known as: FLONASE Place 2 sprays into both nostrils daily.   meloxicam 15 MG tablet Commonly known as: MOBIC Take 1 tablet (15 mg total) by mouth daily as needed for pain. Take with food.   rosuvastatin 10 MG tablet Commonly known as: CRESTOR TAKE ONE TABLET BY MOUTH DAILY       Allergies:  Allergies  Allergen Reactions   Covid-19 Mrna Vaccine [employer]) [id number] Mrna Vacc (Moderna)]     Patient described lip swelling, whelps on his arm and facial swelling that was fairly extensive. Denied any breathing issues.     Family History: Family History  Problem Relation Age of Onset   Diabetes Father    Heart disease Father        CABG after 56   Prostate cancer Maternal Uncle    Kidney cancer Neg Hx    Bladder Cancer Neg Hx     Social History:  reports that he has never smoked. He has never been exposed to tobacco smoke. He has never used smokeless tobacco. He reports current alcohol use of about 3.0 standard drinks of alcohol per week. He reports that he does not use drugs.  Pertinent ROS in HPI  Physical Exam: BP 125/85   Pulse 78   Ht '5\' 9"'$  (1.753 m)    Wt 145 lb (65.8 kg)  BMI 21.41 kg/m   Constitutional:  Well nourished. Alert and oriented, No acute distress. HEENT: [place] AT, moist mucus membranes.  Trachea midline Cardiovascular: No clubbing, cyanosis, or edema. Respiratory: Normal respiratory effort, no increased work of breathing. GU: No CVA tenderness.  No bladder fullness or masses.  Patient with circumcised phallus.  Urethral meatus is patent.  No penile discharge. No penile lesions or rashes. Scrotum without lesions, cysts, rashes and/or edema.  Testicles are located scrotally bilaterally. No masses are appreciated in the testicles. Left and right epididymis are normal. Rectal: Patient with  normal sphincter tone. Anus and perineum without scarring or rashes. No rectal masses are appreciated. Prostate is approximately 50 grams, no nodules are appreciated. Seminal vesicles could not be palpated Neurologic: Grossly intact, no focal deficits, moving all 4 extremities. Psychiatric: Normal mood and affect.   Laboratory Data: PSA Trend - see Urological history      Latest Ref Rng & Units 10/14/2022    7:49 AM 12/31/2021    8:57 AM 10/16/2021    9:25 AM  CBC  WBC 4.0 - 10.5 K/uL 5.8  8.0  5.3   Hemoglobin 13.0 - 17.0 g/dL 15.3  15.2  15.5   Hematocrit 39.0 - 52.0 % 46.3  46.0  46.5   Platelets 150.0 - 400.0 K/uL 259.0  271.0  259.0        Latest Ref Rng & Units 10/14/2022    7:49 AM 01/30/2022    8:34 AM 12/31/2021    8:57 AM  CMP  Glucose 70 - 99 mg/dL 98  97  85   BUN 6 - 23 mg/dL '22  17  18   '$ Creatinine 0.40 - 1.50 mg/dL 1.03  1.05  1.06   Sodium 135 - 145 mEq/L 139  139  141   Potassium 3.5 - 5.1 mEq/L 4.5  4.3  4.3   Chloride 96 - 112 mEq/L 103  103  102   CO2 19 - 32 mEq/L 29  31  35   Calcium 8.4 - 10.5 mg/dL 9.1  9.1  9.7   Total Protein 6.0 - 8.3 g/dL 6.6  6.6  6.3   Total Bilirubin 0.2 - 1.2 mg/dL 0.5  0.6  0.6   Alkaline Phos 39 - 117 U/L 53  55  58   AST 0 - 37 U/L '21  16  14   '$ ALT 0 - 53 U/L '25  24  14     '$ Lab  Results  Component Value Date   HGBA1C 6.1 10/14/2022  I have reviewed the labs.   Pertinent Imaging  12/19/22 09:02  Scan Result 76m    Assessment & Plan:    1. BPH with LUTS -PSA stable -DRE benign -UA benign -Sent for urine culture to rule out any indolent infection since he will be undergoing a cystoscopy -continue conservative management, avoiding bladder irritants and timed voiding's -continue finasteride 5 mg daily-refills given -His symptoms have now become bothersome to him and he would like to pursue a bladder outlet procedure -We will go ahead and get him scheduled for cystoscopy/TRUS -I have explained to the patient that they will  be scheduled for a cystoscopy in our office to evaluate their bladder.  The cystoscopy consists of passing a tube with a lens up through their urethra and into their urinary bladder.   We will inject the urethra with a lidocaine gel prior to introducing the cystoscope to help with any discomfort during the procedure.  After the procedure, they might experience blood in the urine and discomfort with urination.  This will abate after the first few voids.  I have  encouraged the patient to increase water intake  during this time.  Patient denies any allergies to lidocaine.   -I explained a TRUS is an ultrasound of prostate performed through the rectum, during the procedure he will likely feel rectal pressure like the need to have a bowel movement   Return for cysto/TRUS for BOO.  Brunette Lavalle, [place] [address] [place], [place] [address] [phone]

## 2022-12-19 ENCOUNTER — Ambulatory Visit (INDEPENDENT_AMBULATORY_CARE_PROVIDER_SITE_OTHER): Payer: Medicare Other | Admitting: Urology

## 2022-12-19 ENCOUNTER — Encounter: Payer: Self-pay | Admitting: Urology

## 2022-12-19 VITALS — BP 125/85 | HR 78 | Ht 69.0 in | Wt 145.0 lb

## 2022-12-19 DIAGNOSIS — N401 Enlarged prostate with lower urinary tract symptoms: Secondary | ICD-10-CM

## 2022-12-19 DIAGNOSIS — R350 Frequency of micturition: Secondary | ICD-10-CM | POA: Diagnosis not present

## 2022-12-19 DIAGNOSIS — N138 Other obstructive and reflux uropathy: Secondary | ICD-10-CM

## 2022-12-19 LAB — MICROSCOPIC EXAMINATION

## 2022-12-19 LAB — URINALYSIS, COMPLETE
Bilirubin, UA: NEGATIVE
Glucose, UA: NEGATIVE
Ketones, UA: NEGATIVE
Nitrite, UA: NEGATIVE
Protein,UA: NEGATIVE
RBC, UA: NEGATIVE
Specific Gravity, UA: 1.02 (ref 1.005–1.030)
Urobilinogen, Ur: 0.2 mg/dL (ref 0.2–1.0)
pH, UA: 5.5 (ref 5.0–7.5)

## 2022-12-19 LAB — BLADDER SCAN AMB NON-IMAGING

## 2022-12-19 MED ORDER — FINASTERIDE 5 MG PO TABS: 5.0000 mg | ORAL_TABLET | Freq: Every day | ORAL | 3 refills | Status: DC

## 2022-12-22 ENCOUNTER — Encounter: Payer: Self-pay | Admitting: Family Medicine

## 2022-12-22 LAB — CULTURE, URINE COMPREHENSIVE

## 2023-01-02 DIAGNOSIS — M654 Radial styloid tenosynovitis [de Quervain]: Secondary | ICD-10-CM | POA: Diagnosis not present

## 2023-01-12 DIAGNOSIS — M47812 Spondylosis without myelopathy or radiculopathy, cervical region: Secondary | ICD-10-CM | POA: Diagnosis not present

## 2023-01-12 DIAGNOSIS — H902 Conductive hearing loss, unspecified: Secondary | ICD-10-CM | POA: Diagnosis not present

## 2023-01-12 DIAGNOSIS — J309 Allergic rhinitis, unspecified: Secondary | ICD-10-CM | POA: Diagnosis not present

## 2023-01-12 DIAGNOSIS — H6123 Impacted cerumen, bilateral: Secondary | ICD-10-CM | POA: Diagnosis not present

## 2023-01-14 ENCOUNTER — Ambulatory Visit (INDEPENDENT_AMBULATORY_CARE_PROVIDER_SITE_OTHER): Payer: Medicare Other | Admitting: Urology

## 2023-01-14 DIAGNOSIS — N138 Other obstructive and reflux uropathy: Secondary | ICD-10-CM | POA: Diagnosis not present

## 2023-01-14 DIAGNOSIS — R3915 Urgency of urination: Secondary | ICD-10-CM

## 2023-01-14 DIAGNOSIS — N3943 Post-void dribbling: Secondary | ICD-10-CM

## 2023-01-14 DIAGNOSIS — N401 Enlarged prostate with lower urinary tract symptoms: Secondary | ICD-10-CM | POA: Diagnosis not present

## 2023-01-14 DIAGNOSIS — R350 Frequency of micturition: Secondary | ICD-10-CM | POA: Diagnosis not present

## 2023-01-14 DIAGNOSIS — R3912 Poor urinary stream: Secondary | ICD-10-CM

## 2023-01-14 MED ORDER — LIDOCAINE HCL URETHRAL/MUCOSAL 2 % EX GEL
1.0000 | Freq: Once | CUTANEOUS | Status: AC
  Administered 2023-01-14: 1 via URETHRAL

## 2023-01-14 MED ORDER — TAMSULOSIN HCL 0.4 MG PO CAPS: 0.4000 mg | ORAL_CAPSULE | Freq: Every day | ORAL | 11 refills | Status: DC

## 2023-01-14 NOTE — Patient Instructions (Signed)

## 2023-01-14 NOTE — Progress Notes (Signed)
Cystoscopy Procedure Note:  Indication:  72 year old male with increasingly bothersome urinary symptoms of frequency, urgency, postvoid dribbling, and weak stream, previously underwent TUMT with Dr. Eliberto Ivory in 2007, PSA normal at 1.2, corrected for finasteride 2.4.  Has not tried Flomax previously.  After informed consent and discussion of the procedure and its risks, Johnny Munoz was positioned and prepped in the standard fashion. Cystoscopy was performed with a flexible cystoscope. The urethra, bladder neck and entire bladder was visualized in a standard fashion. The prostate was obstructive appearing with adenoma regrowth into the fossa. The ureteral orifices were visualized in their normal location and orientation.  Mild trabeculations, no suspicious lesions, intravesical protrusion of median lobe on retroflexion.  Imaging: The transrectal ultrasound probe was inserted into the rectum and measurements taken for a calculated prostate volume of 43g  Findings: Obstructive appearing prostate with regrowth of adenoma, intravesical protrusion of median lobe  --------------------------------------------------------------------------------------------------------------------------  Assessment and Plan: 72 year old male with bothersome urinary symptoms of weak stream, dribbling, frequency, urgency, sensation of incomplete emptying.  He has a history of a TUMT with Dr. Eliberto Ivory in 2007.  PSA is 1.2, corrected for finasteride 2.4, he has never tried Flomax.  We reviewed his cystoscopy and ultrasound findings today of obstructive appearing prostate.  I do think he would benefit from an outlet procedure.  We discussed options including a trial of Flomax, and risks and benefits were discussed, as well as more aggressive treatments like UroLift or HOLEP.  I think HOLEP would give him a more definitive long-term solution, especially with the irregular regrowth of adenoma in the setting of his prior outlet  procedure, as well as the intravesical protrusion of his median lobe.  We discussed the risks and benefits of HoLEP at length.  The procedure requires general anesthesia and takes 1 to 2 hours, and a holmium laser is used to enucleate the prostate and push this tissue into the bladder.  A morcellator is then used to remove this tissue, which is sent for pathology.  The vast majority(>95%) of patients are able to discharge the same day with a catheter in place for 2 to 3 days, and will follow-up in clinic for a voiding trial.  We specifically discussed the risks of bleeding, infection, retrograde ejaculation, temporary urgency and urge incontinence, very low risk of long-term incontinence, urethral stricture/bladder neck contracture, pathologic evaluation of prostate tissue and possible detection of prostate cancer or other malignancy, and possible need for additional procedures.  He is leaning toward HOLEP, but would like to pursue this after a long vacation in May.  He was interested in the trial of Flomax until that time, and risks and benefits were discussed.  Follow-up in May/June to [place], MD 01/14/2023

## 2023-02-11 DIAGNOSIS — L820 Inflamed seborrheic keratosis: Secondary | ICD-10-CM | POA: Diagnosis not present

## 2023-02-11 DIAGNOSIS — D485 Neoplasm of uncertain behavior of skin: Secondary | ICD-10-CM | POA: Diagnosis not present

## 2023-02-11 DIAGNOSIS — L821 Other seborrheic keratosis: Secondary | ICD-10-CM | POA: Diagnosis not present

## 2023-02-11 DIAGNOSIS — Z08 Encounter for follow-up examination after completed treatment for malignant neoplasm: Secondary | ICD-10-CM | POA: Diagnosis not present

## 2023-02-11 DIAGNOSIS — Z85828 Personal history of other malignant neoplasm of skin: Secondary | ICD-10-CM | POA: Diagnosis not present

## 2023-03-19 DIAGNOSIS — M654 Radial styloid tenosynovitis [de Quervain]: Secondary | ICD-10-CM | POA: Diagnosis not present

## 2023-03-23 DIAGNOSIS — H43811 Vitreous degeneration, right eye: Secondary | ICD-10-CM | POA: Diagnosis not present

## 2023-03-23 DIAGNOSIS — Z961 Presence of intraocular lens: Secondary | ICD-10-CM | POA: Diagnosis not present

## 2023-03-23 DIAGNOSIS — H5203 Hypermetropia, bilateral: Secondary | ICD-10-CM | POA: Diagnosis not present

## 2023-03-23 DIAGNOSIS — H35341 Macular cyst, hole, or pseudohole, right eye: Secondary | ICD-10-CM | POA: Diagnosis not present

## 2023-03-23 DIAGNOSIS — H52223 Regular astigmatism, bilateral: Secondary | ICD-10-CM | POA: Diagnosis not present

## 2023-03-23 DIAGNOSIS — H524 Presbyopia: Secondary | ICD-10-CM | POA: Diagnosis not present

## 2023-03-23 DIAGNOSIS — H35372 Puckering of macula, left eye: Secondary | ICD-10-CM | POA: Diagnosis not present

## 2023-03-23 DIAGNOSIS — H43812 Vitreous degeneration, left eye: Secondary | ICD-10-CM | POA: Diagnosis not present

## 2023-04-16 ENCOUNTER — Ambulatory Visit: Payer: Medicare Other | Admitting: Urology

## 2023-04-17 ENCOUNTER — Other Ambulatory Visit: Payer: Medicare Other

## 2023-04-22 ENCOUNTER — Other Ambulatory Visit (INDEPENDENT_AMBULATORY_CARE_PROVIDER_SITE_OTHER): Payer: Medicare Other

## 2023-04-22 DIAGNOSIS — E785 Hyperlipidemia, unspecified: Secondary | ICD-10-CM | POA: Diagnosis not present

## 2023-04-22 DIAGNOSIS — Z23 Encounter for immunization: Secondary | ICD-10-CM

## 2023-04-22 LAB — COMPREHENSIVE METABOLIC PANEL
ALT: 21 U/L (ref 0–53)
AST: 19 U/L (ref 0–37)
Albumin: 4.1 g/dL (ref 3.5–5.2)
Alkaline Phosphatase: 56 U/L (ref 39–117)
BUN: 16 mg/dL (ref 6–23)
CO2: 25 mEq/L (ref 19–32)
Calcium: 8.7 mg/dL (ref 8.4–10.5)
Chloride: 106 mEq/L (ref 96–112)
Creatinine, Ser: 0.9 mg/dL (ref 0.40–1.50)
GFR: 85.89 mL/min (ref >60.00)
Glucose, Bld: 94 mg/dL (ref 70–99)
Potassium: 3.8 mEq/L (ref 3.5–5.1)
Sodium: 138 mEq/L (ref 135–145)
Total Bilirubin: 0.8 mg/dL (ref 0.2–1.2)
Total Protein: 6.4 g/dL (ref 6.0–8.3)

## 2023-04-23 ENCOUNTER — Other Ambulatory Visit: Payer: Self-pay | Admitting: Internal Medicine

## 2023-04-23 LAB — VARICELLA ZOSTER ANTIBODY, IGG: Varicella IgG: <135 index — ABNORMAL LOW

## 2023-04-23 MED ORDER — ROSUVASTATIN CALCIUM 10 MG PO TABS: 10.0000 mg | ORAL_TABLET | Freq: Every day | ORAL | 1 refills | Status: DC

## 2023-04-28 ENCOUNTER — Ambulatory Visit (INDEPENDENT_AMBULATORY_CARE_PROVIDER_SITE_OTHER): Payer: Medicare Other | Admitting: Urology

## 2023-04-28 ENCOUNTER — Encounter: Payer: Self-pay | Admitting: Urology

## 2023-04-28 VITALS — BP 145/97 | HR 69 | Ht 69.0 in | Wt 150.0 lb

## 2023-04-28 DIAGNOSIS — N138 Other obstructive and reflux uropathy: Secondary | ICD-10-CM | POA: Diagnosis not present

## 2023-04-28 DIAGNOSIS — N401 Enlarged prostate with lower urinary tract symptoms: Secondary | ICD-10-CM | POA: Diagnosis not present

## 2023-04-28 DIAGNOSIS — Z125 Encounter for screening for malignant neoplasm of prostate: Secondary | ICD-10-CM

## 2023-04-28 LAB — BLADDER SCAN AMB NON-IMAGING

## 2023-04-28 MED ORDER — FINASTERIDE 5 MG PO TABS: 5.0000 mg | ORAL_TABLET | Freq: Every day | ORAL | 3 refills | Status: DC

## 2023-04-28 MED ORDER — TAMSULOSIN HCL 0.4 MG PO CAPS: 0.4000 mg | ORAL_CAPSULE | Freq: Every day | ORAL | 3 refills | Status: DC

## 2023-04-28 NOTE — Patient Instructions (Signed)

## 2023-04-28 NOTE — Progress Notes (Signed)
   04/28/2023 10:15 AM   Johnny Munoz Apr 14, 1951 [id number]  Reason for visit: Follow up BPH/LUTS, PSA screening  HPI: 72 year old male with history of TUMT with Dr. Sheppard Penton in 2007, who had been managed on finasteride.  PSA normal at 1.2, corrected for finasteride 2.4.  He previously was followed by Michiel Cowboy, PA, and was set up for a cystoscopy/TRUS with me in February 2024.  This showed regrowth of prostatic tissue as well as a intravesical protrusion of the median lobe, but no suspicious bladder lesions, prostate measured 43 g on ultrasound.  We had discussed options at that time including HOLEP versus trial of Flomax and he opted for trial of Flomax in addition to his baseline finasteride.  He reports significant improvement in his urinary symptoms since adding the Flomax, really denies any urinary complaints today.  PVR today normal at 75ml.  He reports some very mild urinary frequency but overall very happy with his urinary symptoms at this time.  We again reviewed options including continuing maximal medical therapy, or pursuing more definitive treatment with HOLEP that would allow him to come off medications. We discussed the risks and benefits of HoLEP at length.  The procedure requires general anesthesia and takes 1 to 2 hours, and a holmium laser is used to enucleate the prostate and push this tissue into the bladder.  A morcellator is then used to remove this tissue, which is sent for pathology.  The vast majority(>95%) of patients are able to discharge the same day with a catheter in place for 2 to 3 days, and will follow-up in clinic for a voiding trial.  We specifically discussed the risks of bleeding, infection, retrograde ejaculation, temporary urgency and urge incontinence, very low risk of long-term incontinence, urethral stricture/bladder neck contracture, pathologic evaluation of prostate tissue and possible detection of prostate cancer or other malignancy, and  possible need for additional procedures.  He remains hesitant to pursue HOLEP, and currently is satisfied with Flomax and finasteride.  Return precautions were discussed extensively including worsening urinary symptoms, UTIs, gross hematuria, or retention.  He would like to follow-up in 6 months to rediscuss HOLEP.  Flomax and finasteride refilled.  Sondra Come, MD  [facility] [address] [place], [place] [address] [phone]

## 2023-05-25 DIAGNOSIS — H5203 Hypermetropia, bilateral: Secondary | ICD-10-CM | POA: Diagnosis not present

## 2023-05-25 DIAGNOSIS — Z961 Presence of intraocular lens: Secondary | ICD-10-CM | POA: Diagnosis not present

## 2023-05-25 DIAGNOSIS — H35372 Puckering of macula, left eye: Secondary | ICD-10-CM | POA: Diagnosis not present

## 2023-05-25 DIAGNOSIS — H43811 Vitreous degeneration, right eye: Secondary | ICD-10-CM | POA: Diagnosis not present

## 2023-05-25 DIAGNOSIS — H52223 Regular astigmatism, bilateral: Secondary | ICD-10-CM | POA: Diagnosis not present

## 2023-05-25 DIAGNOSIS — H35341 Macular cyst, hole, or pseudohole, right eye: Secondary | ICD-10-CM | POA: Diagnosis not present

## 2023-05-25 DIAGNOSIS — H43812 Vitreous degeneration, left eye: Secondary | ICD-10-CM | POA: Diagnosis not present

## 2023-05-25 DIAGNOSIS — H524 Presbyopia: Secondary | ICD-10-CM | POA: Diagnosis not present

## 2023-06-25 DIAGNOSIS — L578 Other skin changes due to chronic exposure to nonionizing radiation: Secondary | ICD-10-CM | POA: Diagnosis not present

## 2023-06-25 DIAGNOSIS — L570 Actinic keratosis: Secondary | ICD-10-CM | POA: Diagnosis not present

## 2023-06-25 DIAGNOSIS — Z08 Encounter for follow-up examination after completed treatment for malignant neoplasm: Secondary | ICD-10-CM | POA: Diagnosis not present

## 2023-06-25 DIAGNOSIS — Z85828 Personal history of other malignant neoplasm of skin: Secondary | ICD-10-CM | POA: Diagnosis not present

## 2023-07-13 DIAGNOSIS — H6123 Impacted cerumen, bilateral: Secondary | ICD-10-CM | POA: Diagnosis not present

## 2023-07-13 DIAGNOSIS — J309 Allergic rhinitis, unspecified: Secondary | ICD-10-CM | POA: Diagnosis not present

## 2023-07-13 DIAGNOSIS — H902 Conductive hearing loss, unspecified: Secondary | ICD-10-CM | POA: Diagnosis not present

## 2023-07-13 DIAGNOSIS — M47812 Spondylosis without myelopathy or radiculopathy, cervical region: Secondary | ICD-10-CM | POA: Diagnosis not present

## 2023-08-13 DIAGNOSIS — Z23 Encounter for immunization: Secondary | ICD-10-CM | POA: Diagnosis not present

## 2023-08-24 DIAGNOSIS — H35372 Puckering of macula, left eye: Secondary | ICD-10-CM | POA: Diagnosis not present

## 2023-08-24 DIAGNOSIS — H5203 Hypermetropia, bilateral: Secondary | ICD-10-CM | POA: Diagnosis not present

## 2023-08-24 DIAGNOSIS — H43812 Vitreous degeneration, left eye: Secondary | ICD-10-CM | POA: Diagnosis not present

## 2023-08-24 DIAGNOSIS — H43811 Vitreous degeneration, right eye: Secondary | ICD-10-CM | POA: Diagnosis not present

## 2023-08-24 DIAGNOSIS — Z961 Presence of intraocular lens: Secondary | ICD-10-CM | POA: Diagnosis not present

## 2023-08-24 DIAGNOSIS — H35341 Macular cyst, hole, or pseudohole, right eye: Secondary | ICD-10-CM | POA: Diagnosis not present

## 2023-08-24 DIAGNOSIS — H524 Presbyopia: Secondary | ICD-10-CM | POA: Diagnosis not present

## 2023-08-24 DIAGNOSIS — H52223 Regular astigmatism, bilateral: Secondary | ICD-10-CM | POA: Diagnosis not present

## 2023-09-04 DIAGNOSIS — H43822 Vitreomacular adhesion, left eye: Secondary | ICD-10-CM | POA: Diagnosis not present

## 2023-09-04 DIAGNOSIS — H35341 Macular cyst, hole, or pseudohole, right eye: Secondary | ICD-10-CM | POA: Diagnosis not present

## 2023-09-04 DIAGNOSIS — H59811 Chorioretinal scars after surgery for detachment, right eye: Secondary | ICD-10-CM | POA: Diagnosis not present

## 2023-10-02 ENCOUNTER — Telehealth: Payer: Self-pay | Admitting: Internal Medicine

## 2023-10-02 NOTE — Telephone Encounter (Signed)
Copied from CRM [id number]. Topic: Medicare AWV >> Oct 02, 2023  1:37 PM Payton Doughty wrote: Reason for CRM: Called LVM 10/02/2023 to schedule Annual Wellness Visit  Verlee Rossetti; Care Guide Ambulatory Clinical Support [facility] Direct Dial: [phone]

## 2023-10-19 ENCOUNTER — Encounter: Payer: Medicare Other | Admitting: Internal Medicine

## 2023-10-23 ENCOUNTER — Other Ambulatory Visit: Payer: Self-pay | Admitting: Internal Medicine

## 2023-11-03 ENCOUNTER — Encounter: Payer: Medicare Other | Admitting: Internal Medicine

## 2023-11-03 ENCOUNTER — Telehealth: Payer: Self-pay | Admitting: Internal Medicine

## 2023-11-03 ENCOUNTER — Telehealth: Payer: Self-pay

## 2023-11-03 NOTE — Telephone Encounter (Signed)
LMTCB. Need to reschedule pt's appt due to provider having a meeting this evening.

## 2023-11-03 NOTE — Telephone Encounter (Signed)
Spoke to pt, r/s cpe for 12/09/23

## 2023-11-03 NOTE — Telephone Encounter (Signed)
Spoke with pt's wife and offered the pt an appointment with Dr. Darrick Huntsman on Thursday at 12:30 per Dr. Melina Schools okay. Pt is aware of appt date and time.

## 2023-11-04 NOTE — Telephone Encounter (Signed)
My Chart message sent

## 2023-11-05 ENCOUNTER — Encounter: Payer: Self-pay | Admitting: Internal Medicine

## 2023-11-05 ENCOUNTER — Ambulatory Visit (INDEPENDENT_AMBULATORY_CARE_PROVIDER_SITE_OTHER): Payer: Medicare Other | Admitting: Internal Medicine

## 2023-11-05 VITALS — BP 122/78 | HR 74 | Ht 69.0 in | Wt 149.6 lb

## 2023-11-05 DIAGNOSIS — R3911 Hesitancy of micturition: Secondary | ICD-10-CM

## 2023-11-05 DIAGNOSIS — N401 Enlarged prostate with lower urinary tract symptoms: Secondary | ICD-10-CM

## 2023-11-05 DIAGNOSIS — R7303 Prediabetes: Secondary | ICD-10-CM | POA: Diagnosis not present

## 2023-11-05 DIAGNOSIS — E785 Hyperlipidemia, unspecified: Secondary | ICD-10-CM

## 2023-11-05 DIAGNOSIS — Z87898 Personal history of other specified conditions: Secondary | ICD-10-CM

## 2023-11-05 DIAGNOSIS — M545 Low back pain, unspecified: Secondary | ICD-10-CM | POA: Diagnosis not present

## 2023-11-05 DIAGNOSIS — Z1211 Encounter for screening for malignant neoplasm of colon: Secondary | ICD-10-CM

## 2023-11-05 MED ORDER — AZELASTINE HCL 0.1 % NA SOLN: 1.0000 | Freq: Two times a day (BID) | NASAL | Status: AC

## 2023-11-05 MED ORDER — MELOXICAM 15 MG PO TABS
15.0000 mg | ORAL_TABLET | Freq: Every day | ORAL | 0 refills | Status: AC | PRN
Start: 2023-11-05 — End: ?

## 2023-11-05 NOTE — Progress Notes (Signed)
Patient ID: Johnny Munoz, male    DOB: 05-27-1951  Age: 72 y.o. MRN: [id number]  The patient is here for follow up and  management of other chronic and acute problems.   The risk factors are reflected in the social history.   The roster of all physicians providing medical care to patient - is listed in the Snapshot section of the chart.   Activities of daily living:  The patient is 100% independent in all ADLs: dressing, toileting, feeding as well as independent mobility   Home safety : The patient has smoke detectors in the home. They wear seatbelts.  There are no unsecured firearms at home. There is no violence in the home.    There is no risks for hepatitis, STDs or HIV. There is no   history of blood transfusion. They have no travel history to infectious disease endemic areas of the world.   The patient has seen their dentist in the last six month. They have seen their eye doctor in the last year. The patinet  denies slight hearing difficulty with regard to whispered voices and some television programs.  They have deferred audiologic testing in the last year.  They do not  have excessive sun exposure. Discussed the need for sun protection: hats, long sleeves and use of sunscreen if there is significant sun exposure.    Diet: the importance of a healthy diet is discussed. They do have a healthy diet.   The benefits of regular aerobic exercise were discussed. The patient  exercises  3 to 5 days per week  for  60 minutes.    Depression screen: there are no signs or vegative symptoms of depression- irritability, change in appetite, anhedonia, sadness/tearfullness.   The following portions of the patient's history were reviewed and updated as appropriate: allergies, current medications, past family history, past medical history,  past surgical history, past social history  and problem list.   Visual acuity was not assessed per patient preference since the patient has regular follow up  with an  ophthalmologist. Hearing and body mass index were assessed and reviewed.    During the course of the visit the patient was educated and counseled about appropriate screening and preventive services including : fall prevention , diabetes screening, nutrition counseling, colorectal cancer screening, and recommended immunizations.    Chief Complaint:    LEFT EYE MACULAR ISSUES,  ANOTHER SURGERY IS NEEDED .  VISION IS OK,  BUT HAS A PESTY FLOATER   SOME JOINT PAIN BUT USING TOPICAL ANALGESICS   BPH :  seeing Uvaldo Bristle who has recommended holmium laser enucleation (HoLEP) .  Symptoms currently managed with proscar.  No nocturia.   History of microwave TurP in 2004 by Sheppard Penton    Review of Symptoms  Patient denies headache, fevers, malaise, unintentional weight loss, skin rash, eye pain, sinus congestion and sinus pain, sore throat, dysphagia,  hemoptysis , cough, dyspnea, wheezing, chest pain, palpitations, orthopnea, edema, abdominal pain, nausea, melena, diarrhea, constipation, flank pain, dysuria, hematuria, urinary  Frequency, nocturia, numbness, tingling, seizures,  Focal weakness, Loss of consciousness,  Tremor, insomnia, depression, anxiety, and suicidal ideation.    Physical Exam:  BP 122/78   Pulse 74   Ht 5\' 9"  (1.753 m)   Wt 149 lb 9.6 oz (67.9 kg)   SpO2 97%   BMI 22.09 kg/m    Physical Exam Vitals reviewed.  Constitutional:      General: He is not in acute distress.  Appearance: Normal appearance. He is normal weight. He is not ill-appearing, toxic-appearing or diaphoretic.  HENT:     Head: Normocephalic.  Eyes:     General: No scleral icterus.       Right eye: No discharge.        Left eye: No discharge.     Conjunctiva/sclera: Conjunctivae normal.  Cardiovascular:     Rate and Rhythm: Normal rate and regular rhythm.     Heart sounds: Normal heart sounds.  Pulmonary:     Effort: Pulmonary effort is normal. No respiratory distress.     Breath sounds:  Normal breath sounds.  Musculoskeletal:        General: Normal range of motion.     Cervical back: Normal range of motion.  Skin:    General: Skin is warm and dry.  Neurological:     General: No focal deficit present.     Mental Status: He is alert and oriented to person, place, and time. Mental status is at baseline.  Psychiatric:        Mood and Affect: Mood normal.        Behavior: Behavior normal.        Thought Content: Thought content normal.        Judgment: Judgment normal.    Assessment and Plan: Prediabetes -     Comprehensive metabolic panel; Future -     Hemoglobin A1c; Future  Hyperlipidemia, unspecified hyperlipidemia type Assessment & Plan: Managed with Crestor since 2018  due to elevation in risk to 16% using the FRC. Marland Kitchen  He will return for fasting  labs    Lab Results  Component Value Date   CHOL 163 10/14/2022   HDL 73.50 10/14/2022   LDLCALC 77 10/14/2022   LDLDIRECT 120.8 12/29/2013   TRIG 64.0 10/14/2022   CHOLHDL 2 10/14/2022   Lab Results  Component Value Date   CREATININE 0.90 04/22/2023     Orders: -     CBC with Differential/Platelet; Future -     LDL cholesterol, direct; Future -     Lipid panel; Future -     TSH; Future  Colon cancer screening -     Cologuard  Acute right-sided low back pain without sciatica -     Meloxicam; Take 1 tablet (15 mg total) by mouth daily as needed for pain. Take with food.  Dispense: 30 tablet; Refill: 0  Benign prostatic hyperplasia with urinary hesitancy Assessment & Plan: Secondary to BPH, symptoms are well controlled with proscar.  He has deferred surgical procedures recently offered vy Dr Gabrielle Dare   History of elevated PSA Assessment & Plan: PSA has normalized with use of Proscar and he has semi annual follow up with urology    Other orders -     Azelastine HCl; Place 1 spray into both nostrils 2 (two) times daily. Use in each nostril as directed    No follow-ups on file.  Sherlene Shams,  MD

## 2023-11-05 NOTE — Assessment & Plan Note (Addendum)
Secondary to BPH, symptoms are well controlled with proscar.  He has deferred surgical procedures recently offered vy Dr Gabrielle Dare

## 2023-11-05 NOTE — Patient Instructions (Addendum)
Choose one anti inflammatory to take by mouth:  Meloxicam is mobic Motrin is ibuprofen and advil Naproxen is aleve   And combine with 2000 mg tylenol daily   Try using topical astelin nasal spray , it's not a steroid     I will initiate the order for your colon cancer screening  Test, the one called  Cologuard.  It will be delivered to your house, and you will send off a stool sample in the envelope it provides.   Check with your insurance to make sure it is covered as a screening test   Return for labs at your convenence 3 hr fast is  plenty

## 2023-11-05 NOTE — Assessment & Plan Note (Signed)
Managed with Crestor since 2018  due to elevation in risk to 16% using the FRC. Marland Kitchen  He will return for fasting  labs    Lab Results  Component Value Date   CHOL 163 10/14/2022   HDL 73.50 10/14/2022   LDLCALC 77 10/14/2022   LDLDIRECT 120.8 12/29/2013   TRIG 64.0 10/14/2022   CHOLHDL 2 10/14/2022   Lab Results  Component Value Date   CREATININE 0.90 04/22/2023

## 2023-11-05 NOTE — Assessment & Plan Note (Signed)
PSA has normalized with use of Proscar and he has semi annual follow up with urology

## 2023-11-11 DIAGNOSIS — Z1211 Encounter for screening for malignant neoplasm of colon: Secondary | ICD-10-CM | POA: Diagnosis not present

## 2023-11-19 LAB — COLOGUARD: COLOGUARD: NEGATIVE

## 2023-11-20 ENCOUNTER — Other Ambulatory Visit (INDEPENDENT_AMBULATORY_CARE_PROVIDER_SITE_OTHER): Payer: Medicare Other

## 2023-11-20 DIAGNOSIS — E785 Hyperlipidemia, unspecified: Secondary | ICD-10-CM

## 2023-11-20 DIAGNOSIS — R7303 Prediabetes: Secondary | ICD-10-CM

## 2023-11-20 LAB — COMPREHENSIVE METABOLIC PANEL
ALT: 20 U/L (ref 0–53)
AST: 17 U/L (ref 0–37)
Albumin: 4.4 g/dL (ref 3.5–5.2)
Alkaline Phosphatase: 57 U/L (ref 39–117)
BUN: 14 mg/dL (ref 6–23)
CO2: 30 meq/L (ref 19–32)
Calcium: 9 mg/dL (ref 8.4–10.5)
Chloride: 103 meq/L (ref 96–112)
Creatinine, Ser: 0.94 mg/dL (ref 0.40–1.50)
GFR: 81.19 mL/min (ref >60.00)
Glucose, Bld: 90 mg/dL (ref 70–99)
Potassium: 4.5 meq/L (ref 3.5–5.1)
Sodium: 141 meq/L (ref 135–145)
Total Bilirubin: 0.7 mg/dL (ref 0.2–1.2)
Total Protein: 6.4 g/dL (ref 6.0–8.3)

## 2023-11-20 LAB — HEMOGLOBIN A1C: Hgb A1c MFr Bld: 6.2 % (ref 4.6–6.5)

## 2023-11-20 LAB — CBC WITH DIFFERENTIAL/PLATELET
Basophils Absolute: 0.1 10*3/uL (ref 0.0–0.1)
Basophils Relative: 1.1 % (ref 0.0–3.0)
Eosinophils Absolute: 0.3 10*3/uL (ref 0.0–0.7)
Eosinophils Relative: 5.7 % — ABNORMAL HIGH (ref 0.0–5.0)
HCT: 47.2 % (ref 39.0–52.0)
Hemoglobin: 15.9 g/dL (ref 13.0–17.0)
Lymphocytes Relative: 31.5 % (ref 12.0–46.0)
Lymphs Abs: 1.6 10*3/uL (ref 0.7–4.0)
MCHC: 33.7 g/dL (ref 30.0–36.0)
MCV: 93.7 fL (ref 78.0–100.0)
Monocytes Absolute: 0.4 10*3/uL (ref 0.1–1.0)
Monocytes Relative: 7 % (ref 3.0–12.0)
Neutro Abs: 2.8 10*3/uL (ref 1.4–7.7)
Neutrophils Relative %: 54.7 % (ref 43.0–77.0)
Platelets: 259 10*3/uL (ref 150.0–400.0)
RBC: 5.04 Mil/uL (ref 4.22–5.81)
RDW: 14.4 % (ref 11.5–15.5)
WBC: 5.1 10*3/uL (ref 4.0–10.5)

## 2023-11-20 LAB — LDL CHOLESTEROL, DIRECT: Direct LDL: 72 mg/dL

## 2023-11-20 LAB — LIPID PANEL
Cholesterol: 170 mg/dL (ref 0–200)
HDL: 68.7 mg/dL (ref >39.00)
LDL Cholesterol: 80 mg/dL (ref 0–99)
NonHDL: 101.19
Total CHOL/HDL Ratio: 2
Triglycerides: 104 mg/dL (ref 0.0–149.0)
VLDL: 20.8 mg/dL (ref 0.0–40.0)

## 2023-11-20 LAB — TSH: TSH: 2.07 u[IU]/mL (ref 0.35–5.50)

## 2023-12-04 DIAGNOSIS — D2262 Melanocytic nevi of left upper limb, including shoulder: Secondary | ICD-10-CM | POA: Diagnosis not present

## 2023-12-04 DIAGNOSIS — L821 Other seborrheic keratosis: Secondary | ICD-10-CM | POA: Diagnosis not present

## 2023-12-04 DIAGNOSIS — D225 Melanocytic nevi of trunk: Secondary | ICD-10-CM | POA: Diagnosis not present

## 2023-12-04 DIAGNOSIS — D2272 Melanocytic nevi of left lower limb, including hip: Secondary | ICD-10-CM | POA: Diagnosis not present

## 2023-12-04 DIAGNOSIS — L820 Inflamed seborrheic keratosis: Secondary | ICD-10-CM | POA: Diagnosis not present

## 2023-12-04 DIAGNOSIS — D2261 Melanocytic nevi of right upper limb, including shoulder: Secondary | ICD-10-CM | POA: Diagnosis not present

## 2023-12-04 DIAGNOSIS — Z85828 Personal history of other malignant neoplasm of skin: Secondary | ICD-10-CM | POA: Diagnosis not present

## 2023-12-04 DIAGNOSIS — D485 Neoplasm of uncertain behavior of skin: Secondary | ICD-10-CM | POA: Diagnosis not present

## 2023-12-04 DIAGNOSIS — L570 Actinic keratosis: Secondary | ICD-10-CM | POA: Diagnosis not present

## 2023-12-04 DIAGNOSIS — D235 Other benign neoplasm of skin of trunk: Secondary | ICD-10-CM | POA: Diagnosis not present

## 2023-12-04 DIAGNOSIS — D2271 Melanocytic nevi of right lower limb, including hip: Secondary | ICD-10-CM | POA: Diagnosis not present

## 2023-12-07 DIAGNOSIS — L570 Actinic keratosis: Secondary | ICD-10-CM | POA: Diagnosis not present

## 2023-12-07 DIAGNOSIS — L820 Inflamed seborrheic keratosis: Secondary | ICD-10-CM | POA: Diagnosis not present

## 2023-12-07 DIAGNOSIS — L2989 Other pruritus: Secondary | ICD-10-CM | POA: Diagnosis not present

## 2023-12-07 DIAGNOSIS — L538 Other specified erythematous conditions: Secondary | ICD-10-CM | POA: Diagnosis not present

## 2023-12-08 ENCOUNTER — Telehealth: Payer: Self-pay | Admitting: Internal Medicine

## 2023-12-08 NOTE — Telephone Encounter (Signed)
 Copied from CRM [id number]. Topic: Medicare AWV >> Dec 08, 2023  9:32 AM Nathanel DEL wrote: Reason for CRM: Called LVM 12/08/2023 to schedule AWV. Please schedule office or virtual visits.  Nathanel Paschal; Care Guide Ambulatory Clinical Support [place] [facility] Direct Dial: [phone]

## 2023-12-09 ENCOUNTER — Encounter: Payer: Medicare Other | Admitting: Internal Medicine

## 2023-12-28 ENCOUNTER — Ambulatory Visit: Payer: Medicare Other

## 2023-12-29 ENCOUNTER — Ambulatory Visit: Payer: Medicare Other | Admitting: Urology

## 2023-12-29 VITALS — BP 132/88 | HR 74 | Ht 69.0 in | Wt 147.0 lb

## 2023-12-29 DIAGNOSIS — N138 Other obstructive and reflux uropathy: Secondary | ICD-10-CM

## 2023-12-29 DIAGNOSIS — R350 Frequency of micturition: Secondary | ICD-10-CM

## 2023-12-29 DIAGNOSIS — N401 Enlarged prostate with lower urinary tract symptoms: Secondary | ICD-10-CM | POA: Diagnosis not present

## 2023-12-29 DIAGNOSIS — Z125 Encounter for screening for malignant neoplasm of prostate: Secondary | ICD-10-CM

## 2023-12-29 LAB — BLADDER SCAN AMB NON-IMAGING

## 2023-12-29 MED ORDER — FINASTERIDE 5 MG PO TABS: 5.0000 mg | ORAL_TABLET | Freq: Every day | ORAL | 3 refills | Status: AC

## 2023-12-29 MED ORDER — TAMSULOSIN HCL 0.4 MG PO CAPS: 0.4000 mg | ORAL_CAPSULE | Freq: Every day | ORAL | 3 refills | Status: AC

## 2023-12-29 NOTE — Patient Instructions (Signed)
Holmium Laser Enucleation of the Prostate (HoLEP)  HoLEP is a treatment for men with benign prostatic hyperplasia (BPH). The laser surgery removed blockages of urine flow, and is done without any incisions on the body.     What is HoLEP?  HoLEP is a type of laser surgery used to treat obstruction (blockage) of urine flow as a result of benign prostatic hyperplasia (BPH). In men with BPH, the prostate gland is not cancerous, but has become enlarged. An enlarged prostate can result in a number of urinary tract symptoms such as weak urinary stream, difficulty in starting urination, inability to urinate, frequent urination, or getting up at night to urinate.  HoLEP was developed in the 1990's as a more effective and less expensive surgical option for BPH, compared to other surgical options such as laser vaporization(PVP/greenlight laser), transurethral resection of the prostate(TURP), and open simple prostatectomy.   What happens during a HoLEP?  HoLEP requires general anesthesia ("asleep" throughout the procedure).   An antibiotic is given to reduce the risk of infection  A surgical instrument called a resectoscope is inserted through the urethra (the tube that carries urine from the bladder). The resectoscope has a camera that allows the surgeon to view the internal structure of the prostate gland, and to see where the incisions are being made during surgery.  The laser is inserted into the resectoscope and is used to enucleate (free up) the enlarged prostate tissue from the capsule (outer shell) and then to seal up any blood vessels. The tissue that has been removed is pushed back into the bladder.  A morcellator is placed through the resectoscope, and is used to suction out the prostate tissue that has been pushed into the bladder.  When the prostate tissue has been removed, the resectoscope is removed, and a foley catheter is placed to allow healing and drain the urine from the  bladder.     What happens after a HoLEP?  More than 95% of patients go home the same day a few hours after surgery. Less than 5% will be admitted to the hospital overnight for observation to monitor the urine, or if they have other medical problems.  Fluid is flushed through the catheter for about 1 hour after surgery to clear any blood from the urine. It is normal to have some blood in the urine after surgery. The need for blood transfusion is extremely rare.  Eating and drinking are permitted after the procedure once the patient has fully awakened from anesthesia.  The catheter is usually removed 2-3 days after surgery- the patient will come to clinic to have the catheter removed and make sure they can urinate on their own.  It is very important to drink lots of fluids after surgery for one week to keep the bladder flushed.  At first, there may be some burning with urination, but this typically improved within a few hours to days. Most patients do not have a significant amount of pain, and narcotic pain medications are rarely needed.  Symptoms of urinary frequency, urgency, and even leakage are NORMAL for the first few weeks after surgery as the bladder adjusts after having to work hard against blockage from the prostate for many years. This will improve, but can sometimes take several months.  The use of pelvic floor exercises (Kegel exercises) can help improve problems with urinary incontinence.   After catheter removal, patients will be seen at 12 weeks and 6 months for symptom check  No heavy lifting for  at least 2-3 weeks after surgery, however patients can walk and do light activities the first day after surgery. Return to work time depends on occupation.    What are the advantages of HoLEP?  HoLEP has been studied in many different parts of the world and has been shown to be a safe and effective procedure. Although there are many types of BPH surgeries available, HoLEP offers a  unique advantage in being able to remove a large amount of tissue without any incisions on the body, even in very large prostates, while decreasing the risk of bleeding and providing tissue for pathology (to look for cancer). This decreases the need for blood transfusions during surgery, minimizes hospital stay, and reduces the risk of needing repeat treatment.  What are the side effects of HoLEP?  Temporary burning and bleeding during urination. Some blood may be seen in the urine for weeks after surgery and is part of the healing process.  Urinary incontinence (inability to control urine flow) is expected in all patients immediately after surgery and they should wear pads for the first few days/weeks. This typically improves over the course of several weeks. Performing Kegel exercises can help decrease leakage from stress maneuvers such as coughing, sneezing, or lifting. The rate of long term leakage is very low, 1-2%. Patients may also have leakage with urgency and this may be treated with medication. The risk of urge incontinence can be dependent on several factors including age, prostate size, symptoms, and other medical problems.  Retrograde ejaculation or "backwards ejaculation." In 80% of cases, the patient will not see any fluid during ejaculation after surgery.  Erectile function is generally not significantly affected.   What are the risks of HoLEP?  Injury to the urethra or development of scar tissue at a later date  Injury to the capsule of the prostate (typically treated with longer catheterization).  Injury to the bladder or ureteral orifices (where the urine from the kidney drains out)  Infection of the bladder, testes, or kidneys (~4%)  Return of urinary obstruction at a later date requiring another operation (<2%)  Need for blood transfusion or re-operation due to bleeding  Failure to relieve all symptoms and/or need for prolonged catheterization after surgery  5-15% of  patients are found to have previously undiagnosed prostate cancer in their specimen. Prostate cancer can be treated after HoLEP.  Standard risks of anesthesia including blood clots, heart attacks, etc ~1-2% risk of long term urinary incontinence (leakage)  When should I call my doctor?  Fever over 101.3 degrees  Inability to urinate, or large blood clots in the urine   -----------------------------------------------------------------------------------------  Prostate Cancer Screening  Prostate cancer screening is testing that is done to check for the presence of prostate cancer in men. The prostate gland is a walnut-sized gland that is located below the bladder and in front of the rectum in males. The function of the prostate is to add fluid to semen during ejaculation. Prostate cancer is one of the most common types of cancer in men. Who should have prostate cancer screening? Screening recommendations vary based on age and other risk factors, as well as between the professional organizations who make the recommendations. In general, screening is recommended if: You are age 7 to 25 and have an average risk for prostate cancer. You should talk with your health care provider about your need for screening and how often screening should be done. Because most prostate cancers are slow growing and will not cause death,  screening in this age group is generally reserved for men who have a 10- to 15-year life expectancy. You are younger than age 52, and you have these risk factors: Having a father, brother, or uncle who has been diagnosed with prostate cancer. The risk is higher if your family member's cancer occurred at an early age or if you have multiple family members with prostate cancer at an early age. Being a male who is [place] or is of [place] or sub-Saharan African descent. In general, screening is not recommended if: You are younger than age 92. You are between the ages of 36 and 56 and  you have no risk factors. You are 34 years of age or older. At this age, the risks that screening can cause are greater than the benefits that it may provide. If you are at high risk for prostate cancer, your health care provider may recommend that you have screenings more often or that you start screening at a younger age. How is screening for prostate cancer done? The recommended prostate cancer screening test is a blood test called the prostate-specific antigen (PSA) test. PSA is a protein that is made in the prostate. As you age, your prostate naturally produces more PSA. Abnormally high PSA levels may be caused by: Prostate cancer. An enlarged prostate that is not caused by cancer (benign prostatic hyperplasia, or BPH). This condition is very common in older men. A prostate gland infection (prostatitis) or urinary tract infection. Certain medicines such as male hormones (like testosterone) or other medicines that raise testosterone levels. A rectal exam may be done as part of prostate cancer screening to help provide information about the size of your prostate gland. When a rectal exam is performed, it should be done after the PSA level is drawn to avoid any effect on the results. Depending on the PSA results, you may need more tests, such as: A physical exam to check the size of your prostate gland, if not done as part of screening. Blood and imaging tests. A procedure to remove tissue samples from your prostate gland for testing (biopsy). This is the only way to know for certain if you have prostate cancer. What are the benefits of prostate cancer screening? Screening can help to identify cancer at an early stage, before symptoms start and when the cancer can be treated more easily. There is a small chance that screening may lower your risk of dying from prostate cancer. The chance is small because prostate cancer is a slow-growing cancer, and most men with prostate cancer die from a different  cause. What are the risks of prostate cancer screening? The main risk of prostate cancer screening is diagnosing and treating prostate cancer that would never have caused any symptoms or problems. This is called overdiagnosisand overtreatment. PSA screening cannot tell you if your PSA is high due to cancer or a different cause. A prostate biopsy is the only procedure to diagnose prostate cancer. Even the results of a biopsy may not tell you if your cancer needs to be treated. Slow-growing prostate cancer may not need any treatment other than monitoring, so diagnosing and treating it may cause unnecessary stress or other side effects. Questions to ask your health care provider When should I start prostate cancer screening? What is my risk for prostate cancer? How often do I need screening? What type of screening tests do I need? How do I get my test results? What do my results mean? Do I need treatment? Where  to find more information The American Cancer Society: www.cancer.org American Urological Association: www.auanet.org Contact a health care provider if: You have difficulty urinating. You have pain when you urinate or ejaculate. You have blood in your urine or semen. You have pain in your back or in the area of your prostate. Summary Prostate cancer is a common type of cancer in men. The prostate gland is located below the bladder and in front of the rectum. This gland adds fluid to semen during ejaculation. Prostate cancer screening may identify cancer at an early stage, when the cancer can be treated more easily and is less likely to have spread to other areas of the body. The prostate-specific antigen (PSA) test is the recommended screening test for prostate cancer, but it has associated risks. Discuss the risks and benefits of prostate cancer screening with your health care provider. If you are age 69 or older, the risks that screening can cause are greater than the benefits that it may  provide. This information is not intended to replace advice given to you by your health care provider. Make sure you discuss any questions you have with your health care provider. Document Revised: 05/13/2021 Document Reviewed: 05/13/2021 Elsevier Patient Education  2024 [employer].

## 2023-12-29 NOTE — Progress Notes (Signed)
   12/29/2023 9:11 AM   Johnny Munoz September 07, 1951 [id number]  Reason for visit: Follow up BPH/LUTS, PSA screening  HPI: 73 year old male previously followed by Dr. Sheppard Penton, history of TUMT in 2007.  He has been on maximal medical therapy with Flomax and finasteride with well-controlled symptoms.  Cystoscopy/TRUS in February 2024 showed regrowth of prostatic tissue with an intravesical protrusion of the median lobe, and prostate measured 43 g.  We have previously discussed outlet procedures, but he has been satisfied with his urinary symptoms.  He denies any changes over the last year.  He remains very satisfied on his urinary symptoms with Flomax and finasteride.  He has some occasional frequency and nocturia 0-1 time overnight that is minimally bothersome.  PVR today is normal at 57ml.  We again reviewed HOLEP versus continuing maximal medical therapy, and he would like to continue with Flomax and finasteride.  We discussed indications for more aggressive treatment including urinary retention, UTIs, or worsening urinary symptoms.  PSA from January 2024 was normal at 1.2(corrected for finasteride 2.4), and stable over the last 7 years.  We reviewed the AUA guidelines that do not recommend routine screening in men over age 59, and with his stable PSA and very comfortable discontinuing PSA screening.  Flomax and finasteride refilled Can discontinue PSA screening per the guideline recommendations RTC 1 year PVR, consider HOLEP in the future if worsening symptoms   Sondra Come, MD  [facility] [address], [address] [place], [place] [address] [phone]

## 2024-01-06 ENCOUNTER — Ambulatory Visit: Payer: Medicare Other | Admitting: *Deleted

## 2024-01-06 VITALS — Ht 69.0 in | Wt 147.0 lb

## 2024-01-06 DIAGNOSIS — Z0000 Encounter for general adult medical examination without abnormal findings: Secondary | ICD-10-CM | POA: Diagnosis not present

## 2024-01-06 NOTE — Patient Instructions (Signed)
 Mr. Johnny Munoz , Thank you for taking time to come for your Medicare Wellness Visit. I appreciate your ongoing commitment to your health goals. Please review the following plan we discussed and let me know if I can assist you in the future.   Referrals/Orders/Follow-Ups/Clinician Recommendations: None  This is a list of the screening recommended for you and due dates:  Health Maintenance  Topic Date Due   COVID-19 Vaccine (5 - 2024-25 season) 08/02/2023   Medicare Annual Wellness Visit  01/05/2025   Cologuard (Stool DNA test)  11/10/2026   DTaP/Tdap/Td vaccine (3 - Td or Tdap) 07/31/2028   Pneumonia Vaccine  Completed   Flu Shot  Completed   Hepatitis C Screening  Completed   HPV Vaccine  Aged Out   Colon Cancer Screening  Discontinued   Zoster (Shingles) Vaccine  Discontinued    Advanced directives: (In Chart) A copy of your advanced directives are scanned into your chart should your provider ever need it.  Next Medicare Annual Wellness Visit scheduled for next year: Yes 01/11/25 @ 9:30

## 2024-01-06 NOTE — Progress Notes (Signed)
 Subjective:   Johnny Munoz is a 73 y.o. male who presents for Medicare Annual/Subsequent preventive examination.  Visit Complete: Virtual I connected with  Johnny Munoz on 01/06/24 by a audio enabled telemedicine application and verified that I am speaking with the correct person using two identifiers. This patient declined Interactive audio and acupuncturist. Therefore the visit was completed with audio only.   Patient Location: Home  Provider Location: Office/Clinic  I discussed the limitations of evaluation and management by telemedicine. The patient expressed understanding and agreed to proceed.  Vital Signs: Because this visit was a virtual/telehealth visit, some criteria may be missing or patient reported. Any vitals not documented were not able to be obtained and vitals that have been documented are patient reported.  Patient Medicare AWV questionnaire was completed by the patient on 01/03/24; I have confirmed that all information answered by patient is correct and no changes since this date.  Cardiac Risk Factors include: advanced age (>48men, >7 women);dyslipidemia;male gender     Objective:    Today's Vitals   01/06/24 1134  Weight: 147 lb (66.7 kg)  Height: 5' 9 (1.753 m)   Body mass index is 21.71 kg/m.     01/06/2024   11:42 AM 10/08/2022   11:27 AM 10/02/2021   11:22 AM 10/01/2020   11:28 AM 09/22/2019    9:15 AM 09/17/2018    3:36 PM 07/31/2018    6:57 AM  Advanced Directives  Does Patient Have a Medical Advance Directive? Yes Yes Yes Yes Yes Yes Yes  Type of Estate Agent of [place];Living will Healthcare Power of [place];Living will Healthcare Power of [place];Living will Healthcare Power of [place];Living will Healthcare Power of [place];Living will Healthcare Power of [place];Living will Healthcare Power of Attorney  Does patient want to make changes to medical advance directive? No - Patient declined No  - Patient declined No - Patient declined No - Patient declined No - Patient declined No - Patient declined No - Patient declined  Copy of Healthcare Power of Attorney in Chart? Yes - validated most recent copy scanned in chart (See row information) Yes - validated most recent copy scanned in chart (See row information) Yes - validated most recent copy scanned in chart (See row information) Yes - validated most recent copy scanned in chart (See row information) No - copy requested No - copy requested No - copy requested  Would patient like information on creating a medical advance directive?       No - Patient declined    Current Medications (verified) Outpatient Encounter Medications as of 01/06/2024  Medication Sig   acetaminophen  (TYLENOL ) 500 MG tablet Take 500 mg by mouth every 6 (six) hours as needed.   azelastine  (ASTELIN ) 0.1 % nasal spray Place 1 spray into both nostrils 2 (two) times daily. Use in each nostril as directed   finasteride  (PROSCAR ) 5 MG tablet Take 1 tablet (5 mg total) by mouth daily.   fluticasone  (FLONASE ) 50 MCG/ACT nasal spray Place 2 sprays into both nostrils daily.   meloxicam  (MOBIC ) 15 MG tablet Take 1 tablet (15 mg total) by mouth daily as needed for pain. Take with food.   rosuvastatin  (CRESTOR ) 10 MG tablet TAKE 1 TABLET BY MOUTH DAILY   tamsulosin  (FLOMAX ) 0.4 MG CAPS capsule Take 1 capsule (0.4 mg total) by mouth daily.   No facility-administered encounter medications on file as of 01/06/2024.    Allergies (verified) Covid-19 mrna vaccine (pfizer) [covid-19 mrna vacc (moderna)]  History: Past Medical History:  Diagnosis Date   Allergy    Arthritis    BPH (benign prostatic hyperplasia)    Hyperlipidemia    Past Surgical History:  Procedure Laterality Date   cataract Right 02/03/2022   COLONOSCOPY  2003, 10/12/13   Dr. Chipper, Dr Dessa Ped Urethral Microwave Therapy     Family History  Problem Relation Age of Onset   Diabetes Father     Heart disease Father        CABG after 32   Prostate cancer Maternal Uncle    Kidney cancer Neg Hx    Bladder Cancer Neg Hx    Social History   Socioeconomic History   Marital status: Married    Spouse name: Not on file   Number of children: Not on file   Years of education: 16   Highest education level: Bachelor's degree (e.g., BA, AB, BS)  Occupational History   Occupation: Nutritional Therapist: [employer]   Occupation: Retired  Tobacco Use   Smoking status: Never    Passive exposure: Never   Smokeless tobacco: Never  Vaping Use   Vaping status: Never Used  Substance and Sexual Activity   Alcohol use: Yes    Alcohol/week: 3.0 standard drinks of alcohol    Types: 3 drink(s) per week   Drug use: No   Sexual activity: Not on file  Other Topics Concern   Not on file  Social History Narrative   Regular exercise-yes   Caffeine Use-yes         Social Drivers of Health   Financial Resource Strain: Low Risk  (01/03/2024)   Overall Financial Resource Strain (CARDIA)    Difficulty of Paying Living Expenses: Not hard at all  Food Insecurity: No Food Insecurity (01/03/2024)   Hunger Vital Sign    Worried About Running Out of Food in the Last Year: Never true    Ran Out of Food in the Last Year: Never true  Transportation Needs: No Transportation Needs (01/03/2024)   PRAPARE - Administrator, Civil Service (Medical): No    Lack of Transportation (Non-Medical): No  Physical Activity: Sufficiently Active (01/03/2024)   Exercise Vital Sign    Days of Exercise per Week: 5 days    Minutes of Exercise per Session: 30 min  Stress: No Stress Concern Present (01/03/2024)   [employer] of Occupational Health - Occupational Stress Questionnaire    Feeling of Stress : Only a little  Social Connections: Socially Integrated (01/03/2024)   Social Connection and Isolation Panel [NHANES]    Frequency of Communication with Friends and Family: More than three times  a week    Frequency of Social Gatherings with Friends and Family: Twice a week    Attends Religious Services: More than 4 times per year    Active Member of [employer] or Organizations: Yes    Attends Engineer, Structural: More than 4 times per year    Marital Status: Married    Tobacco Counseling Counseling given: Not Answered   Clinical Intake:  Pre-visit preparation completed: Yes  Pain : No/denies pain     BMI - recorded: 21.71 Nutritional Status: BMI of 19-24  Normal Nutritional Risks: None Diabetes: No  How often do you need to have someone help you when you read instructions, pamphlets, or other written materials from your doctor or pharmacy?: 1 - Never  Interpreter Needed?: No  Information entered by :: R.  Leatha Rohner LPN   Activities of Daily Living    01/06/2024   11:35 AM  In your present state of health, do you have any difficulty performing the following activities:  Hearing? 0  Vision? 0  Comment readers  Difficulty concentrating or making decisions? 0  Walking or climbing stairs? 0  Dressing or bathing? 0  Doing errands, shopping? 0  Preparing Food and eating ? N  Using the Toilet? N  In the past six months, have you accidently leaked urine? N  Do you have problems with loss of bowel control? N  Managing your Medications? N  Managing your Finances? N  Housekeeping or managing your Housekeeping? N    Patient Care Team: Marylynn Verneita CROME, MD as PCP - General (Internal Medicine) Dessa, Johnny ORN, MD (General Surgery) Marylynn Verneita CROME, MD (Internal Medicine) Dellie Louanne MATSU, MD (General Surgery) Marylynn Verneita CROME, MD (Internal Medicine)  Indicate any recent Medical Services you may have received from other than [facility] providers in the past year (date may be approximate).     Assessment:   This is a routine wellness examination for Jos.  Hearing/Vision screen Hearing Screening - Comments:: No issues Vision Screening - Comments::  readers   Goals Addressed             This Visit's Progress    Patient Stated       Wants to stay in shape and eat a good diet, lose some weight       Depression Screen    01/06/2024   11:39 AM 11/05/2023   12:42 PM 10/17/2022    8:41 AM 10/08/2022   11:26 AM 06/26/2022    2:07 PM 02/12/2022    3:27 PM 12/31/2021    8:32 AM  PHQ 2/9 Scores  PHQ - 2 Score 0 0 0 0 0 0 0  PHQ- 9 Score 0          Fall Risk    01/06/2024   11:37 AM 11/05/2023   12:42 PM 10/17/2022    8:40 AM 10/08/2022   11:27 AM 10/05/2022    5:44 PM  Fall Risk   Falls in the past year? 0 0 0 0 0  Number falls in past yr: 0 0   0  Injury with Fall? 0 0   0  Risk for fall due to : No Fall Risks No Fall Risks No Fall Risks    Follow up Falls prevention discussed;Falls evaluation completed Falls evaluation completed Falls evaluation completed Falls evaluation completed     MEDICARE RISK AT HOME: Medicare Risk at Home Any stairs in or around the home?: Yes If so, are there any without handrails?: No Home free of loose throw rugs in walkways, pet beds, electrical cords, etc?: Yes Adequate lighting in your home to reduce risk of falls?: Yes Life alert?: No Use of a cane, walker or w/c?: No Grab bars in the bathroom?: No Shower chair or bench in shower?: No Elevated toilet seat or a handicapped toilet?: No   Cognitive Function:    09/17/2018    3:32 PM  MMSE - Mini Mental State Exam  Orientation to time 5  Orientation to Place 5  Registration 3  Attention/ Calculation 5  Recall 3  Language- name 2 objects 2  Language- repeat 1  Language- follow 3 step command 3  Language- read & follow direction 1  Write a sentence 1  Copy design 1  Total score 30  01/06/2024   11:42 AM 10/08/2022   11:48 AM 09/22/2019    9:15 AM  6CIT Screen  What Year? 0 points  0 points  What month? 0 points  0 points  What time? 0 points  0 points  Count back from 20 0 points  0 points  Months in reverse 0 points  0 points 0 points  Repeat phrase 0 points  0 points  Total Score 0 points  0 points    Immunizations Immunization History  Administered Date(s) Administered   Fluad Quad(high Dose 65+) 08/27/2019, 09/30/2021, 09/14/2022   Influenza, High Dose Seasonal PF 08/25/2017, 09/07/2020   Influenza,inj,Quad PF,6+ Mos 08/11/2014, 09/14/2015, 09/17/2018   Influenza-Unspecified 08/31/2012, 09/24/2023   PFIZER(Purple Top)SARS-COV-2 Vaccination 01/13/2020, 02/07/2020, 08/31/2020   PNEUMOCOCCAL CONJUGATE-20 04/11/2021   Pfizer Covid-19 Vaccine Bivalent Booster 25yrs & up 08/31/2021   Pneumococcal Conjugate-13 08/25/2017   Td 12/30/2007   Tdap 07/31/2018   Zoster, Live 10/10/2010    TDAP status: Up to date  Flu Vaccine status: Up to date  Pneumococcal vaccine status: Up to date  Covid-19 vaccine status: Declined, Education has been provided regarding the importance of this vaccine but patient still declined. Advised may receive this vaccine at local pharmacy or Health Dept.or vaccine clinic. Aware to provide a copy of the vaccination record if obtained from local pharmacy or Health Dept. Verbalized acceptance and understanding.  Qualifies for Shingles Vaccine? Yes  Zostavax completed Yes   Shingrix  completed  No Patient stated that he never had chicken pox   Screening Tests Health Maintenance  Topic Date Due   COVID-19 Vaccine (5 - 2024-25 season) 08/02/2023   Medicare Annual Wellness (AWV)  10/09/2023   Colonoscopy  10/13/2023   DTaP/Tdap/Td (3 - Td or Tdap) 07/31/2028   Pneumonia Vaccine 53+ Years old  Completed   INFLUENZA VACCINE  Completed   Hepatitis C Screening  Completed   HPV VACCINES  Aged Out   Zoster Vaccines- Shingrix  Discontinued    Health Maintenance  Health Maintenance Due  Topic Date Due   COVID-19 Vaccine (5 - 2024-25 season) 08/02/2023   Medicare Annual Wellness (AWV)  10/09/2023   Colonoscopy  10/13/2023    Colorectal cancer screening: Type of screening:  Cologuard. Completed 112/2024. Repeat every 3 years  Lung Cancer Screening: (Low Dose CT Chest recommended if Age 23-80 years, 20 pack-year currently smoking OR have quit w/in 15years.) does not qualify.  Additional Screening:  Hepatitis C Screening: does qualify; Completed 03/2016  Vision Screening: Recommended annual ophthalmology exams for early detection of glaucoma and other disorders of the eye. Is the patient up to date with their annual eye exam?  Yes  Who is the provider or what is the name of the office in which the patient attends annual eye exams? [facility] If pt is not established with a provider, would they like to be referred to a provider to establish care? No .   Dental Screening: Recommended annual dental exams for proper oral hygiene   Community Resource Referral / Chronic Care Management: CRR required this visit?  No   CCM required this visit?  No     Plan:     I have personally reviewed and noted the following in the patient's chart:   Medical and social history Use of alcohol, tobacco or illicit drugs  Current medications and supplements including opioid prescriptions. Patient is not currently taking opioid prescriptions. Functional ability and status Nutritional status Physical activity Advanced directives List  of other physicians Hospitalizations, surgeries, and ER visits in previous 12 months Vitals Screenings to include cognitive, depression, and falls Referrals and appointments  In addition, I have reviewed and discussed with patient certain preventive protocols, quality metrics, and best practice recommendations. A written personalized care plan for preventive services as well as general preventive health recommendations were provided to patient.     Angeline Fredericks, LPN   06/03/7973   After Visit Summary: (MyChart) Due to this being a telephonic visit, the after visit summary with patients personalized plan was offered to patient via MyChart    Nurse Notes: None

## 2024-01-15 DIAGNOSIS — H35341 Macular cyst, hole, or pseudohole, right eye: Secondary | ICD-10-CM | POA: Diagnosis not present

## 2024-01-15 DIAGNOSIS — H43392 Other vitreous opacities, left eye: Secondary | ICD-10-CM | POA: Diagnosis not present

## 2024-01-15 DIAGNOSIS — H43812 Vitreous degeneration, left eye: Secondary | ICD-10-CM | POA: Diagnosis not present

## 2024-01-15 DIAGNOSIS — H59811 Chorioretinal scars after surgery for detachment, right eye: Secondary | ICD-10-CM | POA: Diagnosis not present

## 2024-01-18 DIAGNOSIS — H6123 Impacted cerumen, bilateral: Secondary | ICD-10-CM | POA: Diagnosis not present

## 2024-01-18 DIAGNOSIS — J309 Allergic rhinitis, unspecified: Secondary | ICD-10-CM | POA: Diagnosis not present

## 2024-01-18 DIAGNOSIS — H902 Conductive hearing loss, unspecified: Secondary | ICD-10-CM | POA: Diagnosis not present

## 2024-01-18 DIAGNOSIS — M47812 Spondylosis without myelopathy or radiculopathy, cervical region: Secondary | ICD-10-CM | POA: Diagnosis not present

## 2024-02-08 DIAGNOSIS — H33312 Horseshoe tear of retina without detachment, left eye: Secondary | ICD-10-CM | POA: Diagnosis not present

## 2024-02-08 DIAGNOSIS — H43392 Other vitreous opacities, left eye: Secondary | ICD-10-CM | POA: Diagnosis not present

## 2024-02-16 DIAGNOSIS — H33312 Horseshoe tear of retina without detachment, left eye: Secondary | ICD-10-CM | POA: Diagnosis not present

## 2024-02-16 DIAGNOSIS — H43392 Other vitreous opacities, left eye: Secondary | ICD-10-CM | POA: Diagnosis not present

## 2024-02-16 DIAGNOSIS — Z9889 Other specified postprocedural states: Secondary | ICD-10-CM | POA: Diagnosis not present

## 2024-03-08 DIAGNOSIS — H33312 Horseshoe tear of retina without detachment, left eye: Secondary | ICD-10-CM | POA: Diagnosis not present

## 2024-03-08 DIAGNOSIS — H43392 Other vitreous opacities, left eye: Secondary | ICD-10-CM | POA: Diagnosis not present

## 2024-03-08 DIAGNOSIS — Z9889 Other specified postprocedural states: Secondary | ICD-10-CM | POA: Diagnosis not present

## 2024-04-28 ENCOUNTER — Other Ambulatory Visit: Payer: Self-pay | Admitting: Internal Medicine

## 2024-05-02 DIAGNOSIS — Z961 Presence of intraocular lens: Secondary | ICD-10-CM | POA: Diagnosis not present

## 2024-05-02 DIAGNOSIS — H35341 Macular cyst, hole, or pseudohole, right eye: Secondary | ICD-10-CM | POA: Diagnosis not present

## 2024-05-02 DIAGNOSIS — H52223 Regular astigmatism, bilateral: Secondary | ICD-10-CM | POA: Diagnosis not present

## 2024-05-02 DIAGNOSIS — H43811 Vitreous degeneration, right eye: Secondary | ICD-10-CM | POA: Diagnosis not present

## 2024-05-02 DIAGNOSIS — H5203 Hypermetropia, bilateral: Secondary | ICD-10-CM | POA: Diagnosis not present

## 2024-05-02 DIAGNOSIS — H35372 Puckering of macula, left eye: Secondary | ICD-10-CM | POA: Diagnosis not present

## 2024-05-02 DIAGNOSIS — H43812 Vitreous degeneration, left eye: Secondary | ICD-10-CM | POA: Diagnosis not present

## 2024-05-02 DIAGNOSIS — H524 Presbyopia: Secondary | ICD-10-CM | POA: Diagnosis not present

## 2024-05-25 DIAGNOSIS — L821 Other seborrheic keratosis: Secondary | ICD-10-CM | POA: Diagnosis not present

## 2024-05-25 DIAGNOSIS — L538 Other specified erythematous conditions: Secondary | ICD-10-CM | POA: Diagnosis not present

## 2024-05-25 DIAGNOSIS — L820 Inflamed seborrheic keratosis: Secondary | ICD-10-CM | POA: Diagnosis not present

## 2024-05-25 DIAGNOSIS — L609 Nail disorder, unspecified: Secondary | ICD-10-CM | POA: Diagnosis not present

## 2024-06-01 DIAGNOSIS — L609 Nail disorder, unspecified: Secondary | ICD-10-CM | POA: Diagnosis not present

## 2024-07-07 ENCOUNTER — Encounter: Payer: Self-pay | Admitting: Urology

## 2024-08-16 DIAGNOSIS — L814 Other melanin hyperpigmentation: Secondary | ICD-10-CM | POA: Diagnosis not present

## 2024-08-16 DIAGNOSIS — D485 Neoplasm of uncertain behavior of skin: Secondary | ICD-10-CM | POA: Diagnosis not present

## 2024-08-16 DIAGNOSIS — D2339 Other benign neoplasm of skin of other parts of face: Secondary | ICD-10-CM | POA: Diagnosis not present

## 2024-08-29 DIAGNOSIS — Z23 Encounter for immunization: Secondary | ICD-10-CM | POA: Diagnosis not present

## 2024-09-28 DIAGNOSIS — H6123 Impacted cerumen, bilateral: Secondary | ICD-10-CM | POA: Diagnosis not present

## 2024-09-28 DIAGNOSIS — H900 Conductive hearing loss, bilateral: Secondary | ICD-10-CM | POA: Diagnosis not present

## 2024-09-28 DIAGNOSIS — M47812 Spondylosis without myelopathy or radiculopathy, cervical region: Secondary | ICD-10-CM | POA: Diagnosis not present

## 2024-09-28 DIAGNOSIS — J309 Allergic rhinitis, unspecified: Secondary | ICD-10-CM | POA: Diagnosis not present

## 2024-10-24 ENCOUNTER — Encounter: Payer: Self-pay | Admitting: Urology

## 2024-10-29 ENCOUNTER — Other Ambulatory Visit: Payer: Self-pay | Admitting: Internal Medicine

## 2024-11-08 ENCOUNTER — Ambulatory Visit: Payer: Medicare Other | Admitting: Internal Medicine

## 2024-12-26 ENCOUNTER — Ambulatory Visit: Payer: PRIVATE HEALTH INSURANCE | Admitting: Internal Medicine

## 2024-12-27 ENCOUNTER — Ambulatory Visit: Admitting: Urology

## 2024-12-27 DIAGNOSIS — N401 Enlarged prostate with lower urinary tract symptoms: Secondary | ICD-10-CM

## 2024-12-28 ENCOUNTER — Ambulatory Visit: Payer: Self-pay | Admitting: Urology

## 2025-01-10 ENCOUNTER — Ambulatory Visit: Admitting: Urology

## 2025-01-10 DIAGNOSIS — N138 Other obstructive and reflux uropathy: Secondary | ICD-10-CM

## 2025-01-11 ENCOUNTER — Ambulatory Visit: Payer: PRIVATE HEALTH INSURANCE | Admitting: Internal Medicine

## 2025-01-11 ENCOUNTER — Ambulatory Visit: Payer: Medicare Other

## 2025-01-18 ENCOUNTER — Ambulatory Visit: Admitting: Urology
